# Patient Record
Sex: Female | Born: 1935 | Race: White | Hispanic: No | Smoking: Current every day smoker
Health system: Southern US, Community
[De-identification: ages and names within clinical notes are randomized; demographics above are authoritative.]

## PROBLEM LIST (undated history)

## (undated) DIAGNOSIS — I2510 Atherosclerotic heart disease of native coronary artery without angina pectoris: Secondary | ICD-10-CM

## (undated) DIAGNOSIS — F329 Major depressive disorder, single episode, unspecified: Secondary | ICD-10-CM

## (undated) DIAGNOSIS — E1159 Type 2 diabetes mellitus with other circulatory complications: Secondary | ICD-10-CM

## (undated) DIAGNOSIS — H269 Unspecified cataract: Secondary | ICD-10-CM

## (undated) DIAGNOSIS — F17200 Nicotine dependence, unspecified, uncomplicated: Secondary | ICD-10-CM

## (undated) DIAGNOSIS — I252 Old myocardial infarction: Secondary | ICD-10-CM

## (undated) DIAGNOSIS — I350 Nonrheumatic aortic (valve) stenosis: Secondary | ICD-10-CM

## (undated) DIAGNOSIS — N184 Chronic kidney disease, stage 4 (severe): Secondary | ICD-10-CM

## (undated) DIAGNOSIS — E785 Hyperlipidemia, unspecified: Secondary | ICD-10-CM

## (undated) DIAGNOSIS — N83209 Unspecified ovarian cyst, unspecified side: Secondary | ICD-10-CM

## (undated) DIAGNOSIS — I739 Peripheral vascular disease, unspecified: Secondary | ICD-10-CM

## (undated) DIAGNOSIS — I10 Essential (primary) hypertension: Secondary | ICD-10-CM

## (undated) DIAGNOSIS — I119 Hypertensive heart disease without heart failure: Secondary | ICD-10-CM

## (undated) DIAGNOSIS — F32A Depression, unspecified: Secondary | ICD-10-CM

## (undated) DIAGNOSIS — F419 Anxiety disorder, unspecified: Secondary | ICD-10-CM

## (undated) HISTORY — DX: Essential (primary) hypertension: I10

## (undated) HISTORY — DX: Nonrheumatic aortic (valve) stenosis: I35.0

## (undated) HISTORY — PX: TOTAL VAGINAL HYSTERECTOMY: SHX2548

## (undated) HISTORY — PX: PARTIAL HYSTERECTOMY: SHX80

## (undated) HISTORY — DX: Hyperlipidemia, unspecified: E78.5

## (undated) HISTORY — DX: Atherosclerotic heart disease of native coronary artery without angina pectoris: I25.10

## (undated) HISTORY — PX: CARDIAC CATHETERIZATION: SHX172

## (undated) HISTORY — DX: Peripheral vascular disease, unspecified: I73.9

## (undated) HISTORY — DX: Nicotine dependence, unspecified, uncomplicated: F17.200

---

## 1994-07-16 HISTORY — PX: LEG AMPUTATION BELOW KNEE: SHX694

## 1997-07-16 DIAGNOSIS — I252 Old myocardial infarction: Secondary | ICD-10-CM

## 1997-07-16 HISTORY — DX: Old myocardial infarction: I25.2

## 1998-01-26 ENCOUNTER — Inpatient Hospital Stay (HOSPITAL_COMMUNITY): Admission: EM | Admit: 1998-01-26 | Discharge: 1998-02-02 | Payer: Self-pay | Admitting: Emergency Medicine

## 1998-01-26 HISTORY — PX: CORONARY ARTERY BYPASS GRAFT: SHX141

## 2000-05-13 ENCOUNTER — Encounter: Admission: RE | Admit: 2000-05-13 | Discharge: 2000-08-11 | Payer: Self-pay | Admitting: Family Medicine

## 2004-02-09 ENCOUNTER — Encounter: Admission: RE | Admit: 2004-02-09 | Discharge: 2004-02-09 | Payer: Self-pay | Admitting: Family Medicine

## 2004-02-15 ENCOUNTER — Encounter (HOSPITAL_BASED_OUTPATIENT_CLINIC_OR_DEPARTMENT_OTHER): Admission: RE | Admit: 2004-02-15 | Discharge: 2004-05-03 | Payer: Self-pay | Admitting: Internal Medicine

## 2004-05-16 ENCOUNTER — Encounter (HOSPITAL_BASED_OUTPATIENT_CLINIC_OR_DEPARTMENT_OTHER): Admission: RE | Admit: 2004-05-16 | Discharge: 2004-08-10 | Payer: Self-pay | Admitting: Internal Medicine

## 2004-08-11 ENCOUNTER — Encounter (HOSPITAL_BASED_OUTPATIENT_CLINIC_OR_DEPARTMENT_OTHER): Admission: RE | Admit: 2004-08-11 | Discharge: 2004-09-29 | Payer: Self-pay | Admitting: Internal Medicine

## 2006-02-09 ENCOUNTER — Inpatient Hospital Stay (HOSPITAL_COMMUNITY): Admission: EM | Admit: 2006-02-09 | Discharge: 2006-02-13 | Payer: Self-pay | Admitting: Emergency Medicine

## 2006-02-09 ENCOUNTER — Ambulatory Visit: Payer: Self-pay | Admitting: Internal Medicine

## 2006-02-11 ENCOUNTER — Encounter (INDEPENDENT_AMBULATORY_CARE_PROVIDER_SITE_OTHER): Payer: Self-pay | Admitting: Cardiology

## 2006-03-07 ENCOUNTER — Encounter: Admission: RE | Admit: 2006-03-07 | Discharge: 2006-03-07 | Payer: Self-pay | Admitting: Nephrology

## 2006-03-14 ENCOUNTER — Ambulatory Visit (HOSPITAL_COMMUNITY): Admission: RE | Admit: 2006-03-14 | Discharge: 2006-03-14 | Payer: Self-pay | Admitting: Nephrology

## 2006-03-20 ENCOUNTER — Ambulatory Visit: Payer: Self-pay | Admitting: Hematology & Oncology

## 2006-03-22 ENCOUNTER — Ambulatory Visit (HOSPITAL_COMMUNITY): Admission: RE | Admit: 2006-03-22 | Discharge: 2006-03-23 | Payer: Self-pay | Admitting: Nephrology

## 2006-03-22 ENCOUNTER — Encounter (INDEPENDENT_AMBULATORY_CARE_PROVIDER_SITE_OTHER): Payer: Self-pay | Admitting: Specialist

## 2008-08-25 ENCOUNTER — Ambulatory Visit: Payer: Self-pay | Admitting: Vascular Surgery

## 2008-11-11 ENCOUNTER — Encounter: Admission: RE | Admit: 2008-11-11 | Discharge: 2008-11-11 | Payer: Self-pay | Admitting: Family Medicine

## 2008-11-18 ENCOUNTER — Encounter: Admission: RE | Admit: 2008-11-18 | Discharge: 2008-11-18 | Payer: Self-pay | Admitting: Family Medicine

## 2008-11-23 ENCOUNTER — Encounter: Admission: RE | Admit: 2008-11-23 | Discharge: 2008-11-23 | Payer: Self-pay | Admitting: Family Medicine

## 2008-11-23 ENCOUNTER — Encounter (INDEPENDENT_AMBULATORY_CARE_PROVIDER_SITE_OTHER): Payer: Self-pay | Admitting: Family Medicine

## 2010-04-28 ENCOUNTER — Ambulatory Visit: Payer: Self-pay | Admitting: Cardiology

## 2010-07-24 ENCOUNTER — Encounter (HOSPITAL_BASED_OUTPATIENT_CLINIC_OR_DEPARTMENT_OTHER)
Admission: RE | Admit: 2010-07-24 | Discharge: 2010-08-15 | Payer: Self-pay | Source: Home / Self Care | Attending: Internal Medicine | Admitting: Internal Medicine

## 2010-07-28 ENCOUNTER — Ambulatory Visit: Admit: 2010-07-28 | Payer: Self-pay | Admitting: Vascular Surgery

## 2010-07-28 ENCOUNTER — Ambulatory Visit
Admission: RE | Admit: 2010-07-28 | Discharge: 2010-07-28 | Payer: Self-pay | Source: Home / Self Care | Attending: Vascular Surgery | Admitting: Vascular Surgery

## 2010-07-31 LAB — GLUCOSE, CAPILLARY: Glucose-Capillary: 131 mg/dL — ABNORMAL HIGH (ref 70–99)

## 2010-08-02 ENCOUNTER — Ambulatory Visit (HOSPITAL_COMMUNITY)
Admission: RE | Admit: 2010-08-02 | Discharge: 2010-08-02 | Payer: Self-pay | Source: Home / Self Care | Attending: General Surgery | Admitting: General Surgery

## 2010-08-02 ENCOUNTER — Encounter (HOSPITAL_BASED_OUTPATIENT_CLINIC_OR_DEPARTMENT_OTHER): Payer: Self-pay | Admitting: General Surgery

## 2010-08-06 ENCOUNTER — Encounter: Payer: Self-pay | Admitting: Nephrology

## 2010-08-08 ENCOUNTER — Ambulatory Visit
Admission: RE | Admit: 2010-08-08 | Discharge: 2010-08-08 | Payer: Self-pay | Source: Home / Self Care | Attending: Vascular Surgery | Admitting: Vascular Surgery

## 2010-08-09 ENCOUNTER — Inpatient Hospital Stay (HOSPITAL_COMMUNITY)
Admission: AD | Admit: 2010-08-09 | Discharge: 2010-08-11 | Payer: Self-pay | Source: Home / Self Care | Attending: Vascular Surgery | Admitting: Vascular Surgery

## 2010-08-09 LAB — CBC
HCT: 37.1 % (ref 36.0–46.0)
Hemoglobin: 11.9 g/dL — ABNORMAL LOW (ref 12.0–15.0)
MCH: 29.6 pg (ref 26.0–34.0)
MCHC: 32.1 g/dL (ref 30.0–36.0)
MCV: 92.3 fL (ref 78.0–100.0)
Platelets: 302 10*3/uL (ref 150–400)
RBC: 4.02 MIL/uL (ref 3.87–5.11)
RDW: 13.2 % (ref 11.5–15.5)
WBC: 9.4 10*3/uL (ref 4.0–10.5)

## 2010-08-09 LAB — BASIC METABOLIC PANEL
BUN: 49 mg/dL — ABNORMAL HIGH (ref 6–23)
CO2: 24 mEq/L (ref 19–32)
Calcium: 8.9 mg/dL (ref 8.4–10.5)
Chloride: 104 mEq/L (ref 96–112)
Creatinine, Ser: 2.78 mg/dL — ABNORMAL HIGH (ref 0.4–1.2)
GFR calc Af Amer: 20 mL/min — ABNORMAL LOW (ref >60)
GFR calc non Af Amer: 17 mL/min — ABNORMAL LOW (ref >60)
Glucose, Bld: 151 mg/dL — ABNORMAL HIGH (ref 70–99)
Potassium: 5 mEq/L (ref 3.5–5.1)
Sodium: 136 mEq/L (ref 135–145)

## 2010-08-09 LAB — PROTIME-INR
INR: 1.02 (ref 0.00–1.49)
Prothrombin Time: 13.6 seconds (ref 11.6–15.2)

## 2010-08-09 NOTE — Assessment & Plan Note (Signed)
OFFICE VISIT  Tricia [name], Tricia [name] DOB:  1934/07/10                                       08/08/2010 [id number]#:[id number]  The patient presents today for evaluation of ischemia of her left foot. She is known to our service with a very long history of peripheral vascular occlusive disease.  She had this dating back to emboli to her right foot secondary to SFA embolus treated by Dr. Dewayne Shorter in 1996. She subsequently underwent attempted limb salvage and bypass, and eventually underwent a right below-knee amputation.  This was in the late 1990s.  She presents now with severe ischemia of her left foot. She does have a prosthesis on her right foot.  She has open ulceration of her toe and cracking fissures over her left heel.  She was seen in the [facility] and referred to [place].  She does report severe pain associated with this.  She underwent noninvasive vascular studies in our office on January 13 and this revealed an ankle-arm index of 0.38 on the left with dampened waveforms.  She has had ongoing debridement of this in the [facility].  Her pain is constant and awakens her at night.  PAST MEDICAL HISTORY:  Significant for hyperlipidemia and hypertension, status post coronary bypass grafting in the past.  She reports that her cardiac status has been stable.  PHYSICAL EXAMINATION:  General:  White female appearing stated age in no acute distress.  Blood pressure is 200/74, pulse 68, respirations 18. HEENT is normal.  Chest:  Clear bilaterally.  Heart:  Regular rate and rhythm.  Abdomen:  Soft, moderately obese, benign, without masses. Musculoskeletal shows a right BKA.  I do not palpate popliteal or femoral pulses.  Neurologic:  No focal weakness or paresthesias.  Skin: Without ulcers or rashes.  I have reviewed these studies with the patient and her son.  I feel that she is at very high risk for limb loss unless she can  undergo revascularization.  She is scheduled for arteriogram as an outpatient and possible stenting if possible at [facility] on January 26.  We will obtain her renal function studies prior to proceeding with this.    Larina Earthly, M.D. Electronically Signed  TFE/MEDQ  D:  08/08/2010  T:  08/09/2010  Job:  [id number]  cc:   [name], M.D. [facility]

## 2010-08-10 HISTORY — PX: ILIAC VEIN ANGIOPLASTY / STENTING: SHX1788

## 2010-08-10 LAB — BASIC METABOLIC PANEL WITH GFR
BUN: 46 mg/dL — ABNORMAL HIGH (ref 6–23)
CO2: 22 meq/L (ref 19–32)
Calcium: 8.3 mg/dL — ABNORMAL LOW (ref 8.4–10.5)
Chloride: 106 meq/L (ref 96–112)
Creatinine, Ser: 2.76 mg/dL — ABNORMAL HIGH (ref 0.4–1.2)
GFR calc non Af Amer: 17 mL/min — ABNORMAL LOW
Glucose, Bld: 133 mg/dL — ABNORMAL HIGH (ref 70–99)
Potassium: 4.8 meq/L (ref 3.5–5.1)
Sodium: 135 meq/L (ref 135–145)

## 2010-08-10 LAB — GLUCOSE, CAPILLARY: Glucose-Capillary: 170 mg/dL — ABNORMAL HIGH (ref 70–99)

## 2010-08-10 LAB — POCT ACTIVATED CLOTTING TIME
Activated Clotting Time: 199 seconds
Activated Clotting Time: 152 s

## 2010-08-10 LAB — CARDIAC PANEL(CRET KIN+CKTOT+MB+TROPI)
CK, MB: 3.2 ng/mL (ref 0.3–4.0)
Relative Index: 2.2 (ref 0.0–2.5)
Total CK: 147 U/L (ref 7–177)

## 2010-08-11 LAB — BASIC METABOLIC PANEL
BUN: 37 mg/dL — ABNORMAL HIGH (ref 6–23)
CO2: 26 mEq/L (ref 19–32)
Calcium: 8.4 mg/dL (ref 8.4–10.5)
Chloride: 102 mEq/L (ref 96–112)
Creatinine, Ser: 2.79 mg/dL — ABNORMAL HIGH (ref 0.4–1.2)
GFR calc Af Amer: 20 mL/min — ABNORMAL LOW (ref >60)
GFR calc non Af Amer: 17 mL/min — ABNORMAL LOW (ref >60)
Glucose, Bld: 123 mg/dL — ABNORMAL HIGH (ref 70–99)
Potassium: 4.8 mEq/L (ref 3.5–5.1)
Sodium: 138 mEq/L (ref 135–145)

## 2010-08-11 LAB — CBC
HCT: 33.8 % — ABNORMAL LOW (ref 36.0–46.0)
Hemoglobin: 10.8 g/dL — ABNORMAL LOW (ref 12.0–15.0)
MCH: 29.6 pg (ref 26.0–34.0)
MCHC: 32 g/dL (ref 30.0–36.0)
MCV: 92.6 fL (ref 78.0–100.0)
Platelets: 275 10*3/uL (ref 150–400)
RBC: 3.65 MIL/uL — ABNORMAL LOW (ref 3.87–5.11)
RDW: 13.3 % (ref 11.5–15.5)
WBC: 9.3 10*3/uL (ref 4.0–10.5)

## 2010-08-11 LAB — PROTIME-INR
INR: 1.04 (ref 0.00–1.49)
Prothrombin Time: 13.8 seconds (ref 11.6–15.2)

## 2010-08-11 NOTE — Procedures (Unsigned)
DUPLEX DEEP VENOUS EXAM - LOWER EXTREMITY  INDICATION:  Severe PAD, left lower extremity wound.  HISTORY:  Edema:  No. Trauma/Surgery:  Right below knee amputation. Pain:  Left heel pain. PE:  No. Previous DVT:  No. Anticoagulants: Other:  Left posterior leg, heel and great toe wounds with bandaging.  DUPLEX EXAM:               CFV   SFV   PopV  PTV    GSV               R  L  R  L  R  L  R   L  R  L Thrombosis       o     o     +      o     o Spontaneous      +     +     +      +     + Phasic           +     +     +      +     + Augmentation     +     +     +      +     + Compressible     +     +     +      +     + Competent        o     o     o            +  Legend:  + - yes  o - no  p - partial  D - decreased  IMPRESSION: 1. No evidence of deep or superficial vein thrombosis noted in the     left lower extremity. 2. Reflux of >500 milliseconds noted throughout the left femoral     popliteal venous system. 3. Partially occlusive chronic venous thrombus is noted in the left     popliteal vein. 4. Incidental note:  The left common femoral and superficial femoral     arteries appear to be totally occluded with collateral flow     supplying a patent left popliteal artery.   _____________________________ Larina Earthly, M.D.  CH/MEDQ  D:  07/28/2010  T:  07/28/2010  Job:  [id number]

## 2010-08-13 NOTE — Op Note (Addendum)
Tricia [name], Tricia [name] NO.:  [id number]  MEDICAL RECORD NO.:  [id number]           PATIENT TYPE:  LOCATION:                                 FACILITY:  PHYSICIAN:  Fransisco Hertz, MD       DATE OF BIRTH:  [birth year]  DATE OF PROCEDURE:  08/10/2010 DATE OF DISCHARGE:                              OPERATIVE REPORT   PROCEDURES: 1. Left common femoral artery cannulation under ultrasound guidance. 2. Aortogram with carbon dioxide contrast. 3. Right leg runoff with dye contrast. 4. Left common iliac stenting.  PREOPERATIVE DIAGNOSIS:  Left leg rest pain.  POSTOPERATIVE DIAGNOSIS:  Left leg rest pain.  SURGEON:  Fransisco Hertz, MD  ESTIMATED BLOOD LOSS:  Minimum.  CONTRAST:  25 mL.  FINDINGS: 1. A patent aorta.  This is a poor quality image due to overlying     bowel gas. 2. Patent celiac and SMA. 3. Patent bilateral renal arteries. 4. Patent right common iliac artery that demonstrates heavy     calcification. 5. High-grade left common iliac artery stenosis greater than 90% that     is heavily calcified. 6. There is occluded right external iliac artery and no obvious common     femoral artery reconstitution.  There are some collateral that are     evident in the right lateral pelvis around this occlusion. 7. Left common iliac stenosis was resolved after placement of a left     common iliac covered stent with about a 3-mm protrusion into the     aortic lumen without any obvious compromise of the right common     iliac artery flow channel. 8. There is a patent left external iliac artery. 9. Small distal left common iliac artery ulcerated area in the artery     of less than 2 mm in depth. 10.There is a patent left common femoral artery. 11.Patent left profunda artery. 12.Occluded left superficial femoral artery. 13.Reconstituted left above-the-knee popliteal artery. 14.The left trifurcation is intact with anterior tibial as the     dominant runoff. 15.The  left foot pedal arch fills via the anterior tibial artery. 16.On ultrasound examination of the right common femoral artery, there     is no pulse evident.  INDICATIONS:  This is a 77 year old female with known peripheral arterial disease, who previously had a right below-the-knee amputation and now is developing lesions and rest pain in her left foot.  It was felt that her only chance of salvage of this left foot was to undergo possible combined intervention for iliac occlusion and possibly a bypass from the level of the groin to possible popliteal.  The patient is aware of the risks of this procedure which included bleeding, infection, possible embolization, possible rupture, possible need for emergent surgical intervention, and possible need for additional procedures.  She was aware of the risks and agreed to proceed forward with such. Additionally, I had discussed with her due to her advanced baseline renal insufficiency she was at high risk for possible development of acute renal failure due to the need for use of IV dye, but she is aware  of this risk and agreed to proceed forward.  DESCRIPTION OF THE OPERATION:  After full informed written consent was obtained from the patient, she was brought back to the angio suite, placed supine upon the angio table.  She was connected to monitoring equipment and given conscious sedation, amounts of which are documented in our chart.  She was then prepped and draped in a standard fashion for aortogram and bilateral leg runoff.  First, I interrogated the right common femoral artery under ultrasound.  There was absolutely no pulse in this artery, so I went over to the left groin and interrogated it with ultrasound.  There was a weak pulsation in this artery.  I cannulated this artery with a micropuncture needle and then passed the wire.  The wire appeared to be extending laterally into a collateral, so I performed a hand injection through a  needle.  It did demonstrate at least a nub of common femoral artery that was patent, so over the wire I placed the micro sheath and then taking the wire out and the dilator, there actually was pulsatile flow in this common femoral artery.  I took a Holiday representative and tried to track up into the external iliac artery. Initially, I would not, however, so I got a J-wire and passed this successfully up the external iliac artery until I hit what was thought to be an occlusion at the left common iliac artery.  There was enough length, however, to load completely a sheath, so at this point I exchanged the micro sheath for 5-French sheath and I performed some hand injection through the side port.  This demonstrated a high-grade stenosis in the left common iliac artery.  I placed a Kumpfe catheter over the wire and then exchanged the wire for a Glidewire.  Using this combination, I was able to navigate the left common iliac artery stenosis and get into the aorta.  The catheter was then exchanged for a Omni Flush catheter which was advanced to the level of L1, which was connected to the CO2 circuit.  The hand injections were completed, the findings are listed above. Unfortunately, the patient's underlying bowel gas and obesity make these images of poor quality.  I pulled down the catheter down to the level of the bifurcation and then performed another set of CO2 injections.  It became evident that it would be impossible to get decent left leg films without treating the left common iliac artery stenosis, so at this point the wire was exchanged for a Rosen wire.  The catheter was removed.  I exchanged at this point the left sheath for an 8-French long sheath which was advanced through the stenosis without any difficulties.  I gave the patient 6000 units of intravenous Heparin to obtain anticoagulation.  At this point, I was able to do some hand injection to help clarify the size of this common iliac  artery and it appeared to be on digital caliper measurement about 8 mm in the mid segment of this common iliac artery and that based on the hand injections, we also determined where the entry site should be and where the landing zone for stent.  Based on these numbers, I determined an 8 mm x 38-mm covered stent was necessary as the aorta distally was heavily calcified as was the common iliac artery.  I felt that a covered stent was necessary to limit the risk due to a possible iliac rupture.  I over the wire placed this covered stent extending  it slightly into the aorta.  Note that as the right common femoral artery was completely occluded and no evidence of a cannulable right external iliac artery, I could not place a right common iliac stents in a kissing fashion, so we deployed the balloon expandable stent at the predetermined location without any difficulties.  There was a waist in the process of inflating this balloon, but it resolved with inflation up to the nominal pressure at 8 atmospheres.  I then took the balloon out, the hand injections were completed, this demonstrated, in fact, complete resolution of this common iliac artery stenosis and the protrusion at the stent appeared to be about 2-3 mm into the aortic lumen without compromise of the right common iliac artery flow channel. At this point, I over the wire placed an Omni Flush catheter up into the aortic bifurcation and completed some CO2 injections.  This however was not able to visualize the iliac system well and I looked back to my prior images and there appeared to be good imaging of both the entire iliac system down to the common femoral artery, so at this point I removed the Omni Flush catheter after straightened it out with a Bentson wire and then I connected the CO2 circuit to the sheath on her left side which I pulled down into left external iliac artery.  Hand injections were completed which demonstrated the  findings listed above for the left leg run off.  These were excellent images, so I did not feel that additional dye studies were necessary.  It clearly demonstrates a superficial femoral artery occlusion as flush with profunda flow which reconstitutes above-the-knee popliteal segment.  Based on these findings, this patient will need additionally a reconstruction from her left common femoral artery to her above-knee popliteal artery or below- knee popliteal artery.  Unfortunately, as the only cannulation site possible in this case was the left iliac, it is not possible to intervene with a subintimal angioplasty on this side at this time, so my plan at this point is allow the patient to reverse her anticoagulation on her own, pull the sheath in the holding area, keep her flat for 4 hours and then I will discuss with Dr. Arbie Cookey plans in regard of possible left common femoral artery to above-knee bypass versus below knee bypass.    COMPLICATIONS:  None.  CONDITION:  Stable.     Fransisco Hertz, MD     BLC/MEDQ  D:  08/10/2010  T:  08/11/2010  Job:  [id number]  Electronically Signed by Leonides Sake MD on 08/13/2010 03:15:53 PM

## 2010-08-16 ENCOUNTER — Encounter (HOSPITAL_BASED_OUTPATIENT_CLINIC_OR_DEPARTMENT_OTHER): Payer: Medicare Other

## 2010-08-23 NOTE — Discharge Summary (Addendum)
Tricia [name], Tricia [name]               ACCOUNT NO.:  [id number]  MEDICAL RECORD NO.:  [id number]          PATIENT TYPE:  INP  LOCATION:  2031                         FACILITY:  [facility]  PHYSICIAN:  Larina Earthly, M.D.    DATE OF BIRTH:  September 06, 1933  DATE OF ADMISSION:  08/09/2010 DATE OF DISCHARGE:  08/11/2010                              DISCHARGE SUMMARY   ADMITTING DIAGNOSIS:  Ischemia of the left lower extremity.  PAST MEDICAL HISTORY AND DISCHARGE DIAGNOSES: 1. Ischemia of the left lower extremity status post arteriogram with     left common iliac artery stent by Dr. Imogene Burn. 2. Nonhealing wound to the left foot. 3. Status post history of right below-knee amputation. 4. Hyperlipidemia. 5. Hypertension. 6. Status post coronary artery bypass graft in the past.  ALLERGIES:  No known drug allergies.  BRIEF HISTORY:  The patient is a 77 year old Caucasian female who presented to the [facility] on August 08, 2010, with complaints of pain in her left foot.  The patient has a long history of peripheral vascular occlusive disease.  She was treated in the past with a right below-the-knee amputation.  The patient presented with a severe ischemia of her left foot on August 08, 2010.  She had an open ulceration of the toe and cracking fissures over the left heel and has been followed at the [facility].  The patient reported severe pain associated with her foot.  Vascular studies in the office revealed an ABI of 0.3 on the left with damped waveforms.  Her pain was constant and awakened her at night.  Secondary to these issues, Dr. Arbie Cookey felt that the patient was at high risk for limb loss and should undergo revascularization.  She does have renal insufficiency and therefore was admitted on August 09, 2010, for IV hydration prior to procedure.  HOSPITAL COURSE:  The patient was admitted on August 09, 2010, for hydration as previously stated.  She was  then taken to the peripheral vascular lab on August 10, 2010, for an arteriogram and a left common iliac stent was placed.  The patient is noted to have a patent aorta, patent right common iliac artery, occluded right common iliac artery with no obvious common femoral artery reconstitution, patent left common femoral artery, patent left profunda femoral artery, occluded left SFA with reconstitution of the left above-knee pop and a left trifurcation intact with an anterior tibial prominent runoff.  The patient tolerated the procedure well and was hemodynamically stable immediately postprocedure.  She was transferred back to the floor without difficulty.  On August 11, 2010, the patient is without complaint, except some mild soreness.  She is afebrile with stable vital signs although her blood pressure has been variably high.  This improves after she receives her blood pressure medications.   On physical exam, the groin is stable, left foot wounds are stable and the left lower extremity is warm.  The patient's creatinine remained stable at 2.79 postprocedure.  She is doing well and is felt ready for discharge home at this time.    The patient will need to  continue on daily Plavix secondary to the left common iliac artery stent.  The patient is made aware that she does have some distal disease in the left lower extremity; however, Dr. Arbie Cookey feels that this should be monitored at this time to see if her pain improves and the wounds will heal with the left common iliac artery stent only.  If not, the patient will require a left lower extremity revascularization.  LABORATORY DATA:  CBC and BMP on August 11, 2010, white count 9.3, hemoglobin 10.8, hematocrit 33.8, platelets 275,000, sodium 138, potassium 4.8, BUN 39, creatinine 2.7.  MEDICATIONS ON DISCHARGE: 1. Plavix 75 mg one daily. 2. Hydrocodone/acetaminophen 325 mg 1-2 p.o. q.4-6 h. p.r.n. pain. 3. Aspirin 81 mg daily 4.  Benazepril 40 mg daily. 5. Carvedilol 25 mg b.i.d. 6. Cephalexin 500 mg t.i.d. 7. Quantity 0.1 mg at bedtime. 8. Crestor 40 mg at bedtime. 9. Felodipine 10 mg daily. 10.Micardis HCT 80/12.5 mg one daily. 11.Multivitamin one daily. 12.Sanctura XR 60 mg daily. 13.Tekturna 150 mg daily.  Discharge:  The patient received specific written discharge instructions regarding continuation of wound care and activity levels.  The patient will follow up with Dr. Arbie Cookey in 2 weeks.  The patient will be contacted with the date and time of that appointment.  The patient will also need to have repeat labs drawn including a CBC and a BMP prior to her followup appointment with Dr. Arrie Aran to reassess her creatinine.  This will be arranged at [facility] in [place] with the results being sent to Dr. Arrie Aran.     [name], PA   ______________________________ Larina Earthly, M.D.    AY/MEDQ  D:  08/11/2010  T:  08/12/2010  Job:  [id number]  cc:   [name], M.D.  Electronically Signed by [name] PA on 08/14/2010 10:47:18 AM Electronically Signed by TODD EARLY M.D. on 08/23/2010 11:22:08 AM

## 2010-08-29 ENCOUNTER — Encounter (INDEPENDENT_AMBULATORY_CARE_PROVIDER_SITE_OTHER): Payer: Medicare Other

## 2010-08-29 ENCOUNTER — Ambulatory Visit (INDEPENDENT_AMBULATORY_CARE_PROVIDER_SITE_OTHER): Payer: Medicare Other | Admitting: Vascular Surgery

## 2010-08-29 DIAGNOSIS — I70219 Atherosclerosis of native arteries of extremities with intermittent claudication, unspecified extremity: Secondary | ICD-10-CM

## 2010-08-29 DIAGNOSIS — Z01810 Encounter for preprocedural cardiovascular examination: Secondary | ICD-10-CM

## 2010-08-29 DIAGNOSIS — I739 Peripheral vascular disease, unspecified: Secondary | ICD-10-CM

## 2010-08-30 NOTE — Assessment & Plan Note (Signed)
OFFICE VISIT  Tricia [name], Tricia [name] DOB:  29-Apr-1934                                       08/29/2010 [id number]#:[id number]  Patient presents today for follow-up of her left leg lower extremity arterial insufficiency.  She had presented with severe rest pain, severe ischemia, and a nonhealing ulcer over her heel and left great toe.  She underwent outpatient arteriography with Dr. Leonides Sake.  Due to her elevated creatinine, she was admitted for hydration prior to this and did the majority of the procedure with CO2 angio.  She did have a severe stenosis in her left proximal iliac artery and did have a complete occlusion of her superficial femoral artery at its origin and reconstitution of above-knee popliteal artery with a very nice 3-vessel runoff.  I am happy that she reports that she has had complete resolution of her rest pain.  Her foot does look much better without the dependent rubor that it had prior.  Her ulcerations are certainly quite stable, and I feel that it is appropriate to continue observation only at this time. She understands that if she has progressive tissue loss or recurrent pain that we would be prepared to proceed with a left femoral to above- knee popliteal bypass.  She did undergo vein mapping today, and this shows a very nice great saphenous vein for bypass if needed.  We will see her again in 1 month, and she will notify us sooner should she develop any progressive tissue loss or pain.    Larina Earthly, M.D.  TFE/MEDQ  D:  08/29/2010  T:  08/30/2010  Job:  [id number]  cc:   [name], M.D. [facility] Fransisco Hertz, MD

## 2010-08-31 NOTE — Procedures (Unsigned)
VASCULAR LAB EXAM  INDICATION:  Preoperative vein mapping.  HISTORY: Diabetes:  Yes. Cardiac:  Unknown. Hypertension:  Yes.  EXAM:  Left greater saphenous vein mapping.  IMPRESSION:  Patent left greater saphenous vein with diameter measurements included on the following worksheet.  ___________________________________________ Larina Earthly, M.D.  EM/MEDQ  D:  08/29/2010  T:  08/29/2010  Job:  [id number]

## 2010-09-20 ENCOUNTER — Encounter (HOSPITAL_BASED_OUTPATIENT_CLINIC_OR_DEPARTMENT_OTHER): Payer: Medicare Other

## 2010-09-26 ENCOUNTER — Ambulatory Visit (INDEPENDENT_AMBULATORY_CARE_PROVIDER_SITE_OTHER): Payer: Medicare Other | Admitting: Vascular Surgery

## 2010-09-26 DIAGNOSIS — I739 Peripheral vascular disease, unspecified: Secondary | ICD-10-CM

## 2010-09-26 DIAGNOSIS — L98499 Non-pressure chronic ulcer of skin of other sites with unspecified severity: Secondary | ICD-10-CM

## 2010-09-26 NOTE — Assessment & Plan Note (Signed)
OFFICE VISIT  Tricia [name], Tricia [name] DOB:  08/31/1933                                       09/26/2010 [id number]#:[id number]  Patient presents today for continued follow-up of her left foot.  She initially had been seen for profound ischemic changes and underwent arteriography by Dr. Leonides Sake, where she underwent successful common iliac artery angioplasty and stenting.  She did have occlusion of her superficial femoral artery with above-knee popliteal reconstitution and 3-vessel runoff.  She has been followed in the hopes that she can heal her foot without bypass.  She has minimal discomfort associated with this and continues to have very good healing.  She has an eschar over her great toe nail bed and very superficial healing wounds over her heel.  I am quite pleased with the result, as is the patient.  She will continue to follow up with her medical doctors and will notify us should she develop any worsening ulcerations.  This does appear to be on its way to healing.    Larina Earthly, M.D. Electronically Signed  TFE/MEDQ  D:  09/26/2010  T:  09/26/2010  Job:  [id number]  cc:   Windle Guard, M.D. Peter M. [place], M.D. [name], M.D. Fransisco Hertz, MD

## 2010-10-18 ENCOUNTER — Encounter (HOSPITAL_BASED_OUTPATIENT_CLINIC_OR_DEPARTMENT_OTHER): Payer: Medicare Other

## 2010-11-28 NOTE — Procedures (Signed)
RENAL ARTERY DUPLEX EVALUATION   INDICATION:  Increased creatinine, history of  partial left renal  infarct.   HISTORY:  Diabetes:  Yes.  Cardiac:  MI.  Hypertension:  Yes.  Smoking:  Yes.  Other:  The patient states no abdominal vascular surgery.   RENAL ARTERY DUPLEX FINDINGS:  Aorta-Proximal:  93 cm/s  Aorta-Mid:  103 cm/s  Aorta-Distal:  80 cm/s  Celiac Artery Origin:  189 cm/s  SMA Origin:  207 cm/s                                    RIGHT               LEFT  Renal Artery Origin:             233 cm/s            Not visualized  Renal Artery Proximal:           286 cm/s            122 cm/s  Renal Artery Mid:                211 cm/s            81 cm/s  Renal Artery Distal:             106 cm/s            78 cm/s  Hilar Acceleration Time (AT):  Renal-Aortic Ratio (RAR):        3.1                 1.3  Kidney Size:                     12.0 cm             12.4 cm  End Diastolic Ratio (EDR):  Resistive Index (RI):            0.93                0.97   IMPRESSION:  1. Increased Doppler velocities noted in the right renal artery in the      proximal to mid level which suggests a less than 60% stenosis,      based on the renal to aortic ratio.  2. No evidence of increased velocities noted in the left renal artery,      based on limited visualization.  3. Abnormal intrarenal resistive indices are noted bilaterally.  4. The bilateral kidney length measurements are within normal limits.  5. Limited visualization of the abdominal vasculature noted due to      patient body habitus and overlying bowel gas patterns.  6. A preliminary report was faxed to Dr. Hadley Pen office on      08/25/2008.       ___________________________________________  Larina Earthly, M.D.   CH/MEDQ  D:  08/25/2008  T:  08/25/2008  Job:  [id number]

## 2010-12-01 NOTE — Consult Note (Signed)
NAME:  Tricia [name], Tricia [name]                         ACCOUNT NO.:  [id number]   MEDICAL RECORD NO.:  [id number]                   PATIENT TYPE:  REC   LOCATION:  FOOT                                 FACILITY:  [facility]   PHYSICIAN:  Jonelle Sports. Sevier, M.D.              DATE OF BIRTH:  09/03/1933   DATE OF CONSULTATION:  02/17/2004  DATE OF DISCHARGE:                                   CONSULTATION   HISTORY:  This is a 77 year old white female who was referred through the  courtesy of Dr. Jeannetta Nap for assistance with management of an nonhealing ulcer  on the left heel.   The patient has a complex medical history centered around longstanding type  2 diabetes which apparently is in good control but also longstanding smoking  and severe peripheral vascular disease.  Apparently she developed ulceration  of the right lower extremity a number of years ago and eventually went  through a series of as many as 8 or 9 vascular surgeries to that extremity  in an effort to save it.  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   She has from time to time developed areas of ulceration about the left heel  and malleolar area, but these have always healed with relatively simple  measures.  Recently, however (beginning about three months ago), she  developed a fissure on the medial aspect of the left heel, and this has  progressed into a significant ulcer.  She has been treated initially with  Vaseline and Neosporin and dry dressings and had one course of oral sulfa  drugs.  This has not resulted in healing, and the area continues with  fibrinous exudate and somewhat painful.   It bears note that the patient has significant resting pain in that  extremity that is relieved only by dangling the foot.  She is not  sufficiently ambulatory (uses crutches) to really experience true  claudication.   She is here now for our assistance in healing  this wound.   PAST MEDICAL HISTORY:  1. Hypertension.  2. Diabetes.   ALLERGIES:  CODEINE.   MEDICATIONS:  Her regular medications include metformin, glipizide XL,  Lipitor, diltiazem, gemfibrozil, atenolol, benazepril, hydrochlorothiazide,  and hydrocodone.   PHYSICAL EXAMINATION:  Examination today is limited to the left lower  extremity in that the right lower extremity ends in a below-knee amputation  with prosthesis in place.  Skin temperature in left lower extremity are  essentially normal, but there is suspicious dependent rubor extending up to  about top level on that left foot.  There is no edema.  Dorsalis pedis pulse  is minimally palpable.  On Doppler examination, both dorsalis pedis and  posterior tibial pulses on that side are found to be monophasic and quite  damped.  There is also reflected a slight irregularity in heart rhythm.   The  plantar aspects of the foot are fortunately unremarkable, but on the  medial aspect of the left heel is a vertically oriented oval ulcer measuring  18 x 9 x 1.5 mm with its base covered by yellow, adherent, fibrinous sough.   IMPRESSION:  Ischemic ulcer, left heel.   DISPOSITION:  1. The patient was given instruction regarding foot care and diabetes via     video with nurse and physician reinforcement.  2. The matter of the patient's smoking (and incidentally she reeks of     tobacco today) is discussed, and she is advised that this is the worst     possible thing she can do in the face of this type of problem.  3. Although she professes no awareness of having been told there are any     circulatory problems in that extremity, it is discussed with her that her     symptom of rest pain and her physical findings clearly denote severe     vascular disease there which may require attention before this wound will     heal.  She adamantly states that she would not at this point consider     additional vascular surgery.  4. Attention is  then turned to the wound where some marginal crust is     debrided and where the base of the ulcer is carefully cross hatched.  5. The wound is then dressed with an application of Accuzyme and an     absorptive pad, and the heel placed in a protective bulky dressing and     held in place by stockinette, no tape.  6. The patient is wearing an open heel sandal on that foot which appears     adequate.  7. Arrangements will be made for the patient to have a vascular surgical     consultation so that, in the even this ulcer continues to worsen or fails     to heal, the issue of possible vascular surgery, if an option, could be     rethought.  8. The patient is instructed to change her dressing daily at home, cleansing     the wound with warm soap water, rinsing it well, drying it, and then     reapplying the Accuzyme with absorptive dressing.  9. Followup visit will be to this clinic in 6 days.                                               Jonelle Sports. Cheryll Cockayne, M.D.    RES/MEDQ  D:  02/17/2004  T:  02/17/2004  Job:  [id number]   cc:   Windle Guard, M.D.  [address]  [place], [place] [address]  Fax: [phone]   [facility] on [address]

## 2010-12-01 NOTE — Discharge Summary (Signed)
Tricia [name], Tricia [name] NO.:  [id number]   MEDICAL RECORD NO.:  [id number]          PATIENT TYPE:  INP   LOCATION:  5715                         FACILITY:  [facility]   PHYSICIAN:  Alvester Morin, M.D.  DATE OF BIRTH:  09/03/1933   DATE OF ADMISSION:  02/09/2006  DATE OF DISCHARGE:  02/13/2006                                 DISCHARGE SUMMARY   DATE OF BIRTH:  10-15-1933   DATE OF ADMISSION:  February 09, 2006   DATE OF DISCHARGE:  February 13, 2006   ATTENDING PHYSICIAN:  Alvester Morin, M.D.   DISCHARGE DIAGNOSES:  1. Renal infarction.  2. Diabetes mellitus type 2, controlled.  3. Hypertension, improved.  4. Tobacco abuse (2-3 cig/day).  5. Hyperlipidemia.  6. Coronary artery disease, status post coronary artery bypass graft in      1999, with 2-vessel disease.  7. Severe peripheral vascular disease, status post right below knee      amputation in 1999 and decreased pulses in the left foot by Doppler in      2005.  8. Status post hysterectomy.   MEDICATIONS AT DISCHARGE:  1. Atenolol 50 mg 1 tablet by mouth daily.  2. Benazepril 40 mg 1 tablet by mouth daily.  3. Diltiazem 180 mg 1 tablet by mouth daily.  4. Lasix 20 mg 1 tablet by mouth daily.  5. Lipitor 40 mg 1 tablet by mouth daily at bedtime.  6. Aspirin 81 mg 1 tablet by mouth daily.  7. Multivitamins 1 tablet by mouth daily.  8. Calcium plus vitamin D or Os-Cal 1 tablet by mouth 3 times daily.  9. Lovenox 80 mg subcutaneous injection 2 times daily 12 hours apart for      the next 2 days.  10.Morphine sulfate or MS Contin 30 mg 2 tablets of 15 mg q.12 hours      p.r.n. pain.  11.Glipizide 5 mg by mouth 2 times daily.  12.Coumadin 5 mg 1 tablet by mouth daily.   FOLLOWUP:  The patient has an appointment with Dr. Jeannetta Nap on February 15, 2006.  At this time, Dr. Jeannetta Nap will check her INR and adjust her anticoagulant  medicines.  Also, at this point, Dr. Jeannetta Nap should check the patient's  creatinine  and her glycemia in response to our change of her antidiabetic  medication.  We have stopped the metformin and decreased the dose of the  glipizide to 5 mg.  The patient also has an appointment with Dr. Arrie Aran  on March 06, 2006.  The patient was advised to a follow a carbohydrate  moderate diet.   CONDITION ON DISCHARGE:  Improved.   There are no pending labs.   PROCEDURES:  The patient had CT of the abdomen with IV contrast on February 09, 2006, that showed subacute infarct in the left kidney, or lobar nephronia;  tiny gallstones in the gallbladder without evidence of cholecystitis; a 5-cm  left adrenal myelolipoma; mild changes of COPD and chronic bronchitis;  extensive atheromatous changes in the abdominal aorta and mild colonic  diverticulosis without evidence of  diverticulitis.  The patient also had a  2D echo on February 11, 2006, that showed that the left ventricle was mildly  dilated with a systolic function of 50% to 55%; the left ventricular wall  thickness was mildly increased; the aortic valve thickness was mildly  increased; mild aortic valvular regurgitation; mild mitral valvular  regurgitation, and the left region was mild to moderately dilated.   CONSULTANTS:  Dr.Caporosi, Nephrology.   The patient presenting with left lower quadrant abdominal pain.  For full  details, please refer to the patient's chart, but briefly Tricia [name] is a 77-  year-old white woman with past medical history significant for diabetes  mellitus type 2, hypertension severe, peripheral vascular disease,  presenting to the ED with left lower quadrant abdominal pain 10/10,  radiating to the left flank, associated with nausea.  The pain started  suddenly the night prior to admission.  The patient also admitted for  dysuria and urinary frequency for 4-6 weeks prior to admission.   PHYSICAL EXAMINATION:  VITAL SIGNS:  On admission temperature 98, blood  pressure 230/80, pulse 71, respiratory rate 22.   Oxygen saturation 94% on  room air.  GENERAL:  The patient was in 7/10 abdominal and flank pain on the left side.  CARDIOVASCULAR:  Tachycardia with a 3/6 systolic ejection murmur.  GI:  It was normal, except tender in the left lower quadrant.  GU:  Negative for CVA tenderness.  The rest of the exam was normal.   LABS AT ADMISSION:  Sodium 139, potassium 4.4, chloride 108, bicarbonate 25,  BUN 16, creatinine 1.2, glucose 129.  Leukocytes 14.5 with an AST of 11.5.  Hemoglobin 15.3 with an MCV of 13.9.  Hematocrit 46.1 and thrombocytes 381.  An ABG showed pH 7.353, pCO2 37, pO2 72, bicarbonate 20.6, and oxygen  saturation of 94%.  A urinalysis was done and it showed yellow clear urine  with no glucose, no leukocyte esterase, no bilirubin, no ketones, no  nitrates, with traces of hemoglobin.  Total protein 100 mg/dL.  Red blood  cells 0-2 and urobilinogen 0.2.  Also calcium was 9.4.   ASSESSMENT AND PLAN:  1. Abdominal plus flank pain on the left.  A CT of the abdomen showed left      kidney infarct versus lobar nephronia.  A urinalysis was within normal      limits, ruling out a possible infection (lobar nephronia).  The patient      had multiple risk factors for renal infarction, for e.g. increased      coagulability secondary to nephrotic syndrome (She has proteinuria of      24 hours of 4 g) and also can be secondary to large atheromatous emboli      from her aortic atheromas.  We have started the patient on aspirin,      Lipitor and heparin.  We checked PT, PTT, and INRs daily.  We checked      CBCs q.8 hours to rule out bleeding.  We administered fluids.  During      her hospital stay, the INR remained subtherapeutic and 2 days prior to      discharge we started Coumadin.  One day prior to discharge, we started      her on Lovenox and this double therapy has helped the INR to jump from      1.4 to 1.8 before discharge.  The patient was discharged on Lovenox and     Coumadin and will  follow  up with Dr. Jeannetta Nap who will check her INR and      will adjust the treatment accordingly.  For pain, the patient was      started on morphine pump and she was also on MS Contin 30 mg p.o.      b.i.d. p.r.n.  One day prior to discharge, we discontinued the PCI pump      and the patient was in less pain, about 1/10, before discharge.  We      discharged her on MS Contin 30 mg b.i.d., as the pain was well      controlled on this medication.   1. Diabetes mellitus type 2.  The patient was on metformin and glipizide      at home.  We started her on sliding scale insulin in the hospital and      we added glipizide before discharge.  After we added glipizide, the      patient had two hypoglycemic episodes (first 29 and then 49).      Therefore we have decreased her glipizide dose to 5 mg p.o. b.i.d. at      discharge and we also held her metformin because her creatinine during      her hospital stay increased from 1.2 to 1.6.  Dr. Jeannetta Nap is to check      her glycemia and to decide for the need of adding insulin to her      glipizide.  The patient's hemoglobin A1c was 6.4 in the hospital.  She      was placed on an ADA diet that the patient says she will follow at      home.   1. Hypertension.  The patient was started on atenolol and diltiazem after      admission and then we added Lasix.  The patient's blood pressure was      very high on admission to 230/80, but during the hospital stay it      trended down and at discharge it was 140/58.   1. Hyperlipidemia.  We kept the patient on Lipitor and we will discharge      her on the same medications.   1. Status post coronary artery disease, status post coronary artery bypass      graft.  We have checked a 2D echocardiogram that showed a normal      ejection fraction.  An EKG was negative for acute event.   1. Tobacco abuse.  She had a smoking cessation consult.   LABS AT DISCHARGE:  Maximum temperature 99.8, systolic blood pressure  138  over diastolic 57, pulse 62, respiratory rate 20, oxygen saturation 94% on  room air.  CBG 126 at 7:30 a.m.  Sodium 134, potassium 4.4, chloride 104,  bicarbonate 24, BUN 17, creatinine 1.6, glucose 107, calcium 8.5, leukocytes  10.4, hemoglobin 11, hematocrit 32.5, thrombocytes 280.  PT 21.4, INR 1.8,  PTT 56.  There are no pending labs.      Carlus Pavlov, M.D.  Electronically Signed      Alvester Morin, M.D.  Electronically Signed    CG/MEDQ  D:  02/13/2006  T:  02/13/2006  Job:  [id number]   cc:   Buren Kos, M.D.  [name], M.D.

## 2011-02-08 ENCOUNTER — Other Ambulatory Visit: Payer: Self-pay | Admitting: Cardiology

## 2011-02-09 NOTE — Telephone Encounter (Signed)
escribe medication per fax request  

## 2011-02-12 ENCOUNTER — Encounter: Payer: Self-pay | Admitting: *Deleted

## 2011-02-14 ENCOUNTER — Encounter: Payer: Self-pay | Admitting: Cardiology

## 2011-02-14 ENCOUNTER — Ambulatory Visit (INDEPENDENT_AMBULATORY_CARE_PROVIDER_SITE_OTHER): Payer: Medicare Other | Admitting: Cardiology

## 2011-02-14 DIAGNOSIS — F17200 Nicotine dependence, unspecified, uncomplicated: Secondary | ICD-10-CM

## 2011-02-14 DIAGNOSIS — E785 Hyperlipidemia, unspecified: Secondary | ICD-10-CM

## 2011-02-14 DIAGNOSIS — I10 Essential (primary) hypertension: Secondary | ICD-10-CM

## 2011-02-14 DIAGNOSIS — E119 Type 2 diabetes mellitus without complications: Secondary | ICD-10-CM

## 2011-02-14 DIAGNOSIS — E1159 Type 2 diabetes mellitus with other circulatory complications: Secondary | ICD-10-CM | POA: Insufficient documentation

## 2011-02-14 DIAGNOSIS — I739 Peripheral vascular disease, unspecified: Secondary | ICD-10-CM | POA: Insufficient documentation

## 2011-02-14 DIAGNOSIS — I2510 Atherosclerotic heart disease of native coronary artery without angina pectoris: Secondary | ICD-10-CM

## 2011-02-14 DIAGNOSIS — N189 Chronic kidney disease, unspecified: Secondary | ICD-10-CM

## 2011-02-14 DIAGNOSIS — I119 Hypertensive heart disease without heart failure: Secondary | ICD-10-CM | POA: Insufficient documentation

## 2011-02-14 DIAGNOSIS — N184 Chronic kidney disease, stage 4 (severe): Secondary | ICD-10-CM | POA: Insufficient documentation

## 2011-02-14 MED ORDER — HYDRALAZINE HCL 25 MG PO TABS: 25.0000 mg | ORAL_TABLET | Freq: Three times a day (TID) | ORAL | Status: DC

## 2011-02-14 NOTE — Assessment & Plan Note (Signed)
Her last Myoview study demonstrated anterior and inferior basal infarcts. There was no ischemia. Ejection fraction was 55%. This was in August of 2010. She remains asymptomatic. We will continue with medical therapy.

## 2011-02-14 NOTE — Assessment & Plan Note (Signed)
Blood pressure is poorly controlled. Combination of Tekturna with an ACE inhibitor is not ideal but we need to find an acceptable alternative. She has multiple drug intolerances. I recommended hydralazine 25 mg t.i.d. If this results in better blood pressure control then we may be able to stop the tekturna. Have stressed the importance of sodium restriction.

## 2011-02-14 NOTE — Assessment & Plan Note (Signed)
We discussed the importance of smoking cessation particularly as it relates to her peripheral arterial disease. We discussed smoking cessation strategies and she states she will try to quit.

## 2011-02-14 NOTE — Patient Instructions (Addendum)
We will add hydralazine 25 mg three times a day.  Continue your other medications for now.  I will see you again in 6 months.  Stop smoking

## 2011-02-14 NOTE — Progress Notes (Signed)
Tricia [name] Date of Birth: [birth year]   History of Present Illness: Tricia [name] is seen today for follow up. She is status post stenting of the left iliac artery earlier this year for a nonhealing ulcer on her left great toe. She states that this is gradually healing. She's had no fever or chills. She denies any significant chest pain or shortness of breath. Her blood pressure has been elevated. Previously her Micardis HCT was discontinued because of renal insufficiency. She does remain on tekturna and benazepril. She has been intolerant to multiple blood pressure medications including felodipine, Sular, amlodipine, minoxidil, and clonidine.  Current Outpatient Prescriptions on File Prior to Visit  Medication Sig Dispense Refill  . aspirin 325 MG tablet Take 325 mg by mouth daily.        . benazepril (LOTENSIN) 40 MG tablet Take 40 mg by mouth daily.        . carvedilol (COREG) 25 MG tablet Take 25 mg by mouth 3 (three) times daily.       . cephALEXin (KEFLEX) 500 MG capsule Take 500 mg by mouth 4 (four) times daily.        . clopidogrel (PLAVIX) 75 MG tablet Take 75 mg by mouth daily.        . ergocalciferol (VITAMIN D2) 50000 UNITS capsule Take 50,000 Units by mouth every 21 ( twenty-one) days.       Marland Kitchen gemfibrozil (LOPID) 600 MG tablet Take 600 mg by mouth daily.       Marland Kitchen HYDROcodone-acetaminophen (VICODIN) 5-500 MG per tablet Take 1 tablet by mouth as needed.        . multivitamin (THERAGRAN) per tablet Take 1 tablet by mouth daily.        . rosuvastatin (CRESTOR) 40 MG tablet Take 40 mg by mouth daily.        . Trospium Chloride (SANCTURA XR) 60 MG CP24 Take 1 capsule by mouth daily.        . TEKTURNA 150 MG tablet TAKE 1 TABLET BY MOUTH DAILY  30 tablet  0    Allergies  Allergen Reactions  . Codeine     Past Medical History  Diagnosis Date  . Hypertension   . Hyperlipidemia   . Diabetes mellitus     type 2  . Coronary artery disease     post CABG in 1999, with 2-vessel  disease -- - LIMA graft to the LAD and a left radial artery graft to the PDA   . Myocardial infarction 1999  . 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 Chronic renal insufficiency   . Tobacco dependency   . Renal infarction     Past Surgical History  Procedure Date  . Partial hysterectomy   . Total vaginal hysterectomy   . Leg amputation below knee 1996    right  . Cardiac catheterization   . Coronary artery bypass graft 01/26/1998    Est. EF of 40-45% -- with 2-vessel disease -- LIMA graft to the LAD and a left radial artery graft to the PDA   . Iliac vein angioplasty / stenting 08/10/2010     Left common iliac stenting    History  Smoking status  . Current Everyday Smoker -- 0.5 packs/day  . Types: Cigarettes  Smokeless tobacco  . Never Used    History  Alcohol Use No    Family History  Problem Relation Age of Onset  . Heart attack Father 51  Review of Systems: The review of systems is positive for mild edema in her left leg.  All other systems were reviewed and are negative.  Physical Exam: BP 182/62  Pulse 54  Ht 5\' 8"  (1.727 m)  Wt 170 lb (77.111 kg)  BMI 25.85 kg/m2 She is an elderly white female in no acute distress. She is normocephalic, atraumatic. Pupils are equal round and reactive to light and accommodation. Extraocular movements are full. Oropharynx is clear. Neck is without JVD, adenopathy, thyromegaly. She has a soft carotid bruit. Lungs are clear. Cardiac exam reveals a grade 2/6 systolic murmur at the right upper sternal border. There is no S3. Abdomen is soft and nontender. She has a right AKA. Her left foot is wrapped in a bandage. There is trace pedal edema. LABORATORY DATA:   Assessment / Plan:

## 2011-02-14 NOTE — Assessment & Plan Note (Signed)
She remain on aspirin and Plavix as directed by Dr. Arbie Cookey.

## 2011-04-26 ENCOUNTER — Emergency Department (HOSPITAL_COMMUNITY): Payer: Medicare Other

## 2011-04-26 ENCOUNTER — Inpatient Hospital Stay (HOSPITAL_COMMUNITY)
Admission: EM | Admit: 2011-04-26 | Discharge: 2011-05-02 | DRG: 292 | Disposition: A | Discharge status: Home Health | Payer: Medicare Other | Attending: Cardiovascular Disease | Admitting: Cardiovascular Disease

## 2011-04-26 DIAGNOSIS — R0602 Shortness of breath: Secondary | ICD-10-CM

## 2011-04-26 DIAGNOSIS — S78119A Complete traumatic amputation at level between unspecified hip and knee, initial encounter: Secondary | ICD-10-CM

## 2011-04-26 DIAGNOSIS — D649 Anemia, unspecified: Secondary | ICD-10-CM | POA: Diagnosis present

## 2011-04-26 DIAGNOSIS — E785 Hyperlipidemia, unspecified: Secondary | ICD-10-CM | POA: Diagnosis present

## 2011-04-26 DIAGNOSIS — Z79899 Other long term (current) drug therapy: Secondary | ICD-10-CM

## 2011-04-26 DIAGNOSIS — I359 Nonrheumatic aortic valve disorder, unspecified: Secondary | ICD-10-CM | POA: Diagnosis present

## 2011-04-26 DIAGNOSIS — I5033 Acute on chronic diastolic (congestive) heart failure: Principal | ICD-10-CM | POA: Diagnosis present

## 2011-04-26 DIAGNOSIS — E119 Type 2 diabetes mellitus without complications: Secondary | ICD-10-CM | POA: Diagnosis present

## 2011-04-26 DIAGNOSIS — Z7902 Long term (current) use of antithrombotics/antiplatelets: Secondary | ICD-10-CM

## 2011-04-26 DIAGNOSIS — I2510 Atherosclerotic heart disease of native coronary artery without angina pectoris: Secondary | ICD-10-CM | POA: Diagnosis present

## 2011-04-26 DIAGNOSIS — F17200 Nicotine dependence, unspecified, uncomplicated: Secondary | ICD-10-CM | POA: Diagnosis present

## 2011-04-26 DIAGNOSIS — Z6828 Body mass index (BMI) 28.0-28.9, adult: Secondary | ICD-10-CM

## 2011-04-26 DIAGNOSIS — I739 Peripheral vascular disease, unspecified: Secondary | ICD-10-CM | POA: Diagnosis present

## 2011-04-26 DIAGNOSIS — I509 Heart failure, unspecified: Secondary | ICD-10-CM | POA: Diagnosis present

## 2011-04-26 DIAGNOSIS — Z951 Presence of aortocoronary bypass graft: Secondary | ICD-10-CM

## 2011-04-26 DIAGNOSIS — I517 Cardiomegaly: Secondary | ICD-10-CM | POA: Diagnosis present

## 2011-04-26 DIAGNOSIS — N179 Acute kidney failure, unspecified: Secondary | ICD-10-CM | POA: Diagnosis present

## 2011-04-26 DIAGNOSIS — I129 Hypertensive chronic kidney disease with stage 1 through stage 4 chronic kidney disease, or unspecified chronic kidney disease: Secondary | ICD-10-CM | POA: Diagnosis present

## 2011-04-26 DIAGNOSIS — N183 Chronic kidney disease, stage 3 unspecified: Secondary | ICD-10-CM | POA: Diagnosis present

## 2011-04-26 DIAGNOSIS — Z7982 Long term (current) use of aspirin: Secondary | ICD-10-CM

## 2011-04-26 LAB — MAGNESIUM: Magnesium: 2 mg/dL (ref 1.5–2.5)

## 2011-04-26 LAB — CK TOTAL AND CKMB (NOT AT ARMC)
CK, MB: 3.5 ng/mL (ref 0.3–4.0)
Relative Index: 3.4 — ABNORMAL HIGH (ref 0.0–2.5)
Total CK: 104 U/L (ref 7–177)

## 2011-04-26 LAB — COMPREHENSIVE METABOLIC PANEL
ALT: 7 U/L (ref 0–35)
Albumin: 2.7 g/dL — ABNORMAL LOW (ref 3.5–5.2)
Albumin: 2.9 g/dL — ABNORMAL LOW (ref 3.5–5.2)
Alkaline Phosphatase: 57 U/L (ref 39–117)
BUN: 25 mg/dL — ABNORMAL HIGH (ref 6–23)
BUN: 24 mg/dL — ABNORMAL HIGH (ref 6–23)
Chloride: 108 mEq/L (ref 96–112)
Creatinine, Ser: 2.13 mg/dL — ABNORMAL HIGH (ref 0.50–1.10)
Potassium: 4.5 mEq/L (ref 3.5–5.1)
Sodium: 141 mEq/L (ref 135–145)
Total Bilirubin: 0.2 mg/dL — ABNORMAL LOW (ref 0.3–1.2)
Total Protein: 6.6 g/dL (ref 6.0–8.3)

## 2011-04-26 LAB — URINALYSIS, ROUTINE W REFLEX MICROSCOPIC
Bilirubin Urine: NEGATIVE
Nitrite: NEGATIVE
Protein, ur: >300 mg/dL — AB
Specific Gravity, Urine: 1.018 (ref 1.005–1.030)
Urobilinogen, UA: 1 mg/dL (ref 0.0–1.0)

## 2011-04-26 LAB — URINE MICROSCOPIC-ADD ON

## 2011-04-26 LAB — CBC
HCT: 35.6 % — ABNORMAL LOW (ref 36.0–46.0)
Platelets: 295 10*3/uL (ref 150–400)
RDW: 14.6 % (ref 11.5–15.5)
WBC: 8.4 10*3/uL (ref 4.0–10.5)

## 2011-04-26 LAB — DIFFERENTIAL
Basophils Absolute: 0.1 10*3/uL (ref 0.0–0.1)
Eosinophils Absolute: 0.1 10*3/uL (ref 0.0–0.7)
Eosinophils Relative: 1 % (ref 0–5)
Lymphocytes Relative: 20 % (ref 12–46)

## 2011-04-26 LAB — GLUCOSE, CAPILLARY: Glucose-Capillary: 167 mg/dL — ABNORMAL HIGH (ref 70–99)

## 2011-04-26 LAB — CARDIAC PANEL(CRET KIN+CKTOT+MB+TROPI): CK, MB: 3.4 ng/mL (ref 0.3–4.0)

## 2011-04-26 LAB — PRO B NATRIURETIC PEPTIDE: Pro B Natriuretic peptide (BNP): 26201 pg/mL — ABNORMAL HIGH (ref 0–450)

## 2011-04-27 DIAGNOSIS — I359 Nonrheumatic aortic valve disorder, unspecified: Secondary | ICD-10-CM

## 2011-04-27 DIAGNOSIS — I5031 Acute diastolic (congestive) heart failure: Secondary | ICD-10-CM

## 2011-04-27 LAB — GLUCOSE, CAPILLARY
Glucose-Capillary: 110 mg/dL — ABNORMAL HIGH (ref 70–99)
Glucose-Capillary: 101 mg/dL — ABNORMAL HIGH (ref 70–99)

## 2011-04-27 LAB — CARDIAC PANEL(CRET KIN+CKTOT+MB+TROPI)
Relative Index: 2.7 — ABNORMAL HIGH (ref 0.0–2.5)
Total CK: 123 U/L (ref 7–177)
Troponin I: <0.3 ng/mL (ref <0.30)

## 2011-04-27 LAB — COMPREHENSIVE METABOLIC PANEL
ALT: 6 U/L (ref 0–35)
Alkaline Phosphatase: 53 U/L (ref 39–117)
BUN: 27 mg/dL — ABNORMAL HIGH (ref 6–23)
CO2: 22 mEq/L (ref 19–32)
Calcium: 9.2 mg/dL (ref 8.4–10.5)
GFR calc Af Amer: 23 mL/min — ABNORMAL LOW (ref >90)
GFR calc non Af Amer: 20 mL/min — ABNORMAL LOW (ref >90)
Glucose, Bld: 102 mg/dL — ABNORMAL HIGH (ref 70–99)
Sodium: 141 mEq/L (ref 135–145)
Total Protein: 6.2 g/dL (ref 6.0–8.3)

## 2011-04-28 ENCOUNTER — Inpatient Hospital Stay (HOSPITAL_COMMUNITY): Payer: Medicare Other

## 2011-04-28 DIAGNOSIS — I5033 Acute on chronic diastolic (congestive) heart failure: Secondary | ICD-10-CM

## 2011-04-28 LAB — BASIC METABOLIC PANEL
BUN: 33 mg/dL — ABNORMAL HIGH (ref 6–23)
Creatinine, Ser: 2.67 mg/dL — ABNORMAL HIGH (ref 0.50–1.10)
GFR calc Af Amer: 19 mL/min — ABNORMAL LOW (ref >90)
GFR calc non Af Amer: 16 mL/min — ABNORMAL LOW (ref >90)

## 2011-04-28 LAB — GLUCOSE, CAPILLARY: Glucose-Capillary: 114 mg/dL — ABNORMAL HIGH (ref 70–99)

## 2011-04-29 DIAGNOSIS — I5023 Acute on chronic systolic (congestive) heart failure: Secondary | ICD-10-CM

## 2011-04-29 LAB — BASIC METABOLIC PANEL
CO2: 26 mEq/L (ref 19–32)
Calcium: 9.2 mg/dL (ref 8.4–10.5)
Chloride: 102 mEq/L (ref 96–112)
Potassium: 3.9 mEq/L (ref 3.5–5.1)
Sodium: 137 mEq/L (ref 135–145)

## 2011-04-29 LAB — CBC
Hemoglobin: 10.9 g/dL — ABNORMAL LOW (ref 12.0–15.0)
Platelets: 274 10*3/uL (ref 150–400)
RBC: 3.74 MIL/uL — ABNORMAL LOW (ref 3.87–5.11)
WBC: 8.1 10*3/uL (ref 4.0–10.5)

## 2011-04-29 LAB — GLUCOSE, CAPILLARY
Glucose-Capillary: 115 mg/dL — ABNORMAL HIGH (ref 70–99)
Glucose-Capillary: 141 mg/dL — ABNORMAL HIGH (ref 70–99)
Glucose-Capillary: 112 mg/dL — ABNORMAL HIGH (ref 70–99)
Glucose-Capillary: 145 mg/dL — ABNORMAL HIGH (ref 70–99)

## 2011-04-29 LAB — CREATININE, URINE, RANDOM: Creatinine, Urine: 46.75 mg/dL

## 2011-04-29 LAB — SODIUM, URINE, RANDOM: Sodium, Ur: 46 mEq/L

## 2011-04-30 LAB — BASIC METABOLIC PANEL
CO2: 25 mEq/L (ref 19–32)
Calcium: 8.7 mg/dL (ref 8.4–10.5)
GFR calc non Af Amer: 14 mL/min — ABNORMAL LOW (ref >90)
Potassium: 3.8 mEq/L (ref 3.5–5.1)
Sodium: 139 mEq/L (ref 135–145)

## 2011-04-30 LAB — CBC
MCH: 28.7 pg (ref 26.0–34.0)
Platelets: 274 10*3/uL (ref 150–400)
RBC: 3.66 MIL/uL — ABNORMAL LOW (ref 3.87–5.11)

## 2011-04-30 LAB — GLUCOSE, CAPILLARY
Glucose-Capillary: 107 mg/dL — ABNORMAL HIGH (ref 70–99)
Glucose-Capillary: 137 mg/dL — ABNORMAL HIGH (ref 70–99)
Glucose-Capillary: 124 mg/dL — ABNORMAL HIGH (ref 70–99)
Glucose-Capillary: 117 mg/dL — ABNORMAL HIGH (ref 70–99)

## 2011-05-01 DIAGNOSIS — I5021 Acute systolic (congestive) heart failure: Secondary | ICD-10-CM

## 2011-05-01 LAB — BASIC METABOLIC PANEL
CO2: 25 mEq/L (ref 19–32)
CO2: 24 mEq/L (ref 19–32)
Calcium: 8.8 mg/dL (ref 8.4–10.5)
Chloride: 104 mEq/L (ref 96–112)
Creatinine, Ser: 3.15 mg/dL — ABNORMAL HIGH (ref 0.50–1.10)
Creatinine, Ser: 3.25 mg/dL — ABNORMAL HIGH (ref 0.50–1.10)
GFR calc Af Amer: 16 mL/min — ABNORMAL LOW (ref >90)
GFR calc non Af Amer: 13 mL/min — ABNORMAL LOW (ref >90)
Glucose, Bld: 183 mg/dL — ABNORMAL HIGH (ref 70–99)

## 2011-05-01 LAB — LIPID PANEL
Cholesterol: 159 mg/dL (ref 0–200)
Total CHOL/HDL Ratio: 3 RATIO
VLDL: 31 mg/dL (ref 0–40)

## 2011-05-01 LAB — GLUCOSE, CAPILLARY: Glucose-Capillary: 113 mg/dL — ABNORMAL HIGH (ref 70–99)

## 2011-05-02 LAB — BASIC METABOLIC PANEL
CO2: 26 mEq/L (ref 19–32)
Chloride: 104 mEq/L (ref 96–112)
Creatinine, Ser: 2.94 mg/dL — ABNORMAL HIGH (ref 0.50–1.10)
Sodium: 139 mEq/L (ref 135–145)

## 2011-05-02 LAB — GLUCOSE, CAPILLARY: Glucose-Capillary: 130 mg/dL — ABNORMAL HIGH (ref 70–99)

## 2011-05-06 NOTE — Consult Note (Signed)
Tricia [name], Tricia [name] NO.:  [id number]  MEDICAL RECORD NO.:  [id number]  LOCATION:  4741                         FACILITY:  [facility]  PHYSICIAN:  Veverly Fells. Excell Seltzer, MD  DATE OF BIRTH:  [birth year]  DATE OF CONSULTATION:  04/26/2011 DATE OF DISCHARGE:                                CONSULTATION   REASON FOR ADMISSION:  Shortness of breath.  HISTORY OF PRESENT ILLNESS:  Tricia [name] is a very nice 77 year old woman with coronary artery disease and peripheral arterial disease.  She presents with a 2-week history of progressive shortness of breath with minimal activity.  She also has developed orthopnea and PND.  She has not had significant leg swelling.  She has had to sleep in a recliner the last several nights.  She notes that her blood pressure has been markedly elevated.  She presented to the emergency department and was felt to have findings consistent with congestive heart failure.  She was severely hypertensive with systolic blood pressure greater than 200 mmHg.  She was started on nitroglycerin paste and given IV furosemide. At the time of my evaluation, she is feeling better and states that her breathing has improved.  She continues to smoke cigarettes.  She denies chest pain or pressure.  She denies any fevers, chills, or cough.  She admits to not following a close diet and she has been eating salty foods.  HOME MEDICATIONS: 1. Aspirin 325 mg daily. 2. Benazepril 40 mg daily. 3. Carvedilol 25 mg daily. 4. Plavix 75 mg daily. 5. Vitamin D. 6. Gemfibrozil 600 mg daily. 7. Hydrocodone/acetaminophen 5/500 mg as needed. 8. Multivitamin daily. 9. Crestor 40 mg daily. 10.Tekturna 150 mg daily. 11.Sanctura XR 60 mg daily.  ALLERGIES:  CODEINE.  PAST MEDICAL HISTORY: 1. Coronary artery disease status post coronary artery bypass surgery     in 1999 with a LIMA graft to the LAD and the left radial artery     graft to the PDA. 2. Severe peripheral vascular  disease with an above-knee amputation on     the right and recent lower extremity stenting procedure on the     left.  She had stenting of the left common iliac artery, August 11, 2010. 3. Chronic kidney disease. 4. Type 2 diabetes. 5. Severe hypertension. 6. Hyperlipidemia. 7. History of hysterectomy.  SOCIAL HISTORY:  The patient lives in [place].  She lives with her son.  She is disabled.  She is a long-time smoker greater than 50 years and continues to smoke cigarettes.  She does not drink alcohol and has no history of illicit drug use.  FAMILY HISTORY:  The patient's father died of myocardial infarction at age 39.  Her mother had Alzheimer disease.  REVIEW OF SYSTEMS:  Pertinent positives included arthralgias, chronic pain, constipation, frequent belching, otherwise negative except as per HPI.  PHYSICAL EXAMINATION:  GENERAL:  The patient is alert and oriented.  She is a very pleasant elderly woman in no acute distress. VITAL SIGNS:  Blood pressure was 209/60, heart rate 67, respiratory rate is 14, and oxygen saturations 95% on 2 L of oxygen per nasal cannula. HEENT:  Normal. NECK:  Bilateral carotid bruits are noted.  Jugular venous pressure is elevated.  There is no thyromegaly or thyroid nodules. LUNGS:  There is very poor air movement bilaterally with prolonged expiratory phase.  There are mild bibasilar rales. CARDIOVASCULAR:  The heart is regular rate and rhythm with a 2/6 systolic ejection murmur at the right upper sternal border. ABDOMEN:  Soft, obese, and nontender.  No organomegaly.  Positive bowel sounds. BACK:  No CVA tenderness. EXTREMITIES:  There is a right above-knee amputation noted.  The left leg shows no edema.  The left foot is red, but nontender.  Peripheral pulses in the left foot are diminished.  EKG shows normal sinus rhythm with right bundle-branch block.  This is unchanged from prior tracings.  Chest x-ray shows cardiomegaly with  interstitial edema and bilateral pleural effusions.  LABORATORY DATA:  Hemoglobin of 11.4, white blood cell count 8.4, and platelets 295,000.  Potassium is 5.1, creatinine is 2.13.  CK-MB is 104 and 3.5.  ASSESSMENT: 1. This is a 77 year old woman with acute diastolic heart failure     likely related to hypertensive urgency.  Recommend initiate a     nitroglycerin drip to titrate to systolic blood pressure ranged     from 140-160 mmHg.  We will continue IV diuresis with Lasix 40 mg     IV 3 times daily.  She will be continued on her home medical     regimen which includes hydralazine, benazepril, and carvedilol.  We     will assess her response to diuretic therapy and closely follow her     renal function with caution not to exacerbate her chronic kidney     disease.  Tobacco cessation was reviewed with the patient, but she     is highly unlikely to quit considering all the medical problems she     has been through with continued smoking.  We will repeat an     echocardiogram since she does not have a history of congestive     heart failure to make sure that there is not new left ventricular     dysfunction and cardiac enzymes will be cycled. 2. Type 2 diabetes.  The patient will be placed on sliding scale     insulin. 3. Coronary artery disease status post coronary artery bypass surgery.     We will cycle enzymes, but there is no clinical evidence of active     ischemia. 4. Peripheral arterial disease.  This appears stable.  Continue     aspirin and Plavix.     Veverly Fells. Excell Seltzer, MD     MDC/MEDQ  D:  04/26/2011  T:  04/27/2011  Job:  [id number]  Electronically Signed by Tonny Bollman MD on 05/06/2011 01:18:02 AM

## 2011-05-07 NOTE — Discharge Summary (Addendum)
Tricia [name], Tricia [name] NO.:  [id number]  MEDICAL RECORD NO.:  [id number]  LOCATION:  4741                         FACILITY:  [facility]  PHYSICIAN:  Peter M. Swaziland, M.D.  DATE OF BIRTH:  06-02-1934  DATE OF ADMISSION:  04/26/2011 DATE OF DISCHARGE:  05/02/2011                              DISCHARGE SUMMARY   DISCHARGE DIAGNOSES: 1. Acute on chronic diastolic heart failure. 2. Acute on chronic kidney disease. 3. Coronary artery disease, status post coronary artery bypass graft     in 1999. 4. Severe peripheral vascular disease with above-knee amputation on     the right and recent lower extremity stenting procedure on the left     with stenting of the left common iliac artery on August 11, 2010. 5. Mild aortic stenosis by echocardiogram, this admission. 6. Hypertension with hypertensive urgency, this admission. 7. Type 2 diabetes. 8. Hyperlipidemia. 9. History of hysterectomy.  HOSPITAL COURSE:  Tricia [name] is a 77 year old lady with a history of renal disease, diastolic heart failure and hypertension, who presents to [facility] with complaints of shortness of breath.  She was also noted to be markedly hypertensive.  She developed orthopnea and PND as well and it is hard to sleep in a recliner for last several nights. Her blood pressure was greater than 200 mmHg on admission to the ER and she was started on nitroglycerin paste and given IV Lasix.  At the time of evaluation by Cardiology, these measures have somewhat improved her symptoms, but she was still hypertensive at 209/60.  She was thus initiated on IV diuretics with Lasix as her presentation was consistent with acute diastolic heart failure, likely related to hypertensive urgency.  She was also placed on a nitroglycerin drip in the mean time. Her Plavix was continued.  A 2-D echocardiogram was obtained with the findings as noted below.  Throughout this hospitalization, her electrolytes were  also monitored along with her renal function; however, with IV diuresis, although she was symptomatically better, her creatinine began to rise and on October 16 it had peaked at 3.25.  She was seen in consultation by the Renal Service who felt that this was likely related to acute on chronic decompensated heart failure in the setting of increased diuresis with dual blockade of the renin- angiotensin system.  Her benazepril was discontinued.  She had been taken off of Tekturna on admission, which was also recommended.  Renal ultrasound was performed with findings noted below.  Her creatinine did improve somewhat on day of discharge and leveled off at 2.94.  Her medications were up titrated from her home doses of hydralazine and Lasix.  Per discussion with Dr. Swaziland, she was previously on above-the- max dose of Coreg at 25 t.i.d. and this will be decreased to b.i.d. at discharge.  Dr. Swaziland has seen and examined her today and felt she is stable for discharge.  DISCHARGE LABS:  Sodium 139, potassium 4.4, chloride 104, CO2 of 26, glucose 121, BUN 50, creatinine 0.94.  Total cholesterol 159, triglycerides 166, HDL 53, LDL 75.  STUDIES: 1. Renal ultrasound on October 13, showed diffuse bilateral renal     parenchymal atrophy.  Left  renal cyst.  No hydronephrosis. 2. Chest x-ray on April 26, 2011, showed cardiomegaly and     interstitial edema and bilateral pleural effusions. 3. A 2-D echocardiogram on April 27, 2011, showed EF of 55-60%,     acute diastolic dysfunction, mild-to-moderate LVH, mild aortic     stenosis, mild aortic regurgitation.  DISCHARGE MEDICATIONS: 1. Lipitor 40 mg at bedtime.  She may use of half tablet of her prior     Crestor tablets daily, then start this medicine. 2. Coreg 25 mg b.i.d. 3. Gemfibrozil 600 mg daily. 4. Hydralazine 50 mg t.i.d. 5. Lasix 40 mg b.i.d. 6. Plavix 75 mg daily. 7. Hydrocodone/APAP 7.5/325 mg 2 tabs q.4 hours p.r.n. pain, no more      than 8 per day. 8. Multivitamin 1 tablet daily.  The patient is instructed to stop her Crestor as it was changed to Lipitor with notes as above.  Her Tekturna and benazepril were also discontinued.  Please note the pharmacy tech, it was unclear whether or not the patient had actually been taking Tekturna, as this was hand written on to the patient sheet.  DISPOSITION:  Tricia [name] will be discharged in stable condition to home. She is to follow a low-sodium, heart-healthy diet.  She is to keep her IV site clean and dry.  She will follow up with Norma Fredrickson, N.P. on May 16, 2011, at 9:30 am.  The [facility] will also be calling the patient for an appointment.  DURATION OF DISCHARGE ENCOUNTER:  Greater than 30 minutes including physician and PA time.     Ronie Spies, P.A.C.   ______________________________ Peter M. Swaziland, M.D.    DD/MEDQ  D:  05/02/2011  T:  05/02/2011  Job:  [id number]  cc:   Peter M. Swaziland, M.D.  Electronically Signed by PETER Swaziland M.D. on 05/07/2011 02:56:53 PM Electronically Signed by Ronie Spies  on 05/10/2011 10:27:30 AM MedRecNo: [id number] [facility], Account: [id number], DocSeq: [id number] Aspirin 81mg  daily was also added to the med list at discharge. Electronically Signed by Ronie Spies  on 05/10/2011 10:25:37 AM

## 2011-05-09 NOTE — Consult Note (Signed)
[name], [name] NO.:  [id number]  MEDICAL RECORD NO.:  [id number]  LOCATION:  4741                         FACILITY:  [facility]  PHYSICIAN:  [name], M.D.DATE OF BIRTH:  [birth year]  DATE OF CONSULTATION:  04/28/2011 DATE OF DISCHARGE:                                CONSULTATION   CONSULTING PHYSICIAN:  Hillis Range, MD  REASON FOR CONSULTATION:  Acute kidney injury on chronic kidney disease in the setting of decompensated congestive heart failure.  HISTORY OF PRESENT ILLNESS:  Tricia [name] is a 77 year old white female, well known to me from outpatient follow up due to her multiple medical problems including atherosclerotic vascular disease, status post bypass graft surgery, diabetes, hypertension, peripheral vascular disease, and chronic kidney disease stage 3, who presented to [facility] with a 1-2 week history of increasing fatigue, weakness, dyspnea on exertion as well as lower extremity edema.  She was found to have acute on chronic systolic and diastolic congestive heart failure as well as hypertensive urgency with a blood pressure of 209/60.  She was admitted for IV diuresis.  Her blood pressures have improved, however, her creatinine has increased during her stay the trend is as follows.  On admission, creatinine was 2.13 on April 26, 2011; on April 27, 2011, it was 2.3; on April 28, 2011, it was 2.67.  We were consulted to help further evaluate and manage her acute kidney injury on chronic kidney disease.  Of note, the patient has been on Lotensin during her hospitalization as well as had aggressive diuresis and has been negative 3.7 L since her initial presentation.  ALLERGIES:  She has no known drug allergies.  PAST MEDICAL HISTORY: 1. Atherosclerotic vascular disease.     a.     Status post bypass graft surgery in 1999 with LIMA to LAD      and left radial artery graft to the PDA 2. Peripheral vascular disease, status  post right AKA and a left     stenting of her left common iliac artery in January, 2012. 3. Chronic kidney disease stage 3, baseline creatinine 2. 4. Diabetes mellitus. 5. Longstanding poorly controlled hypertension. 6. Hyperlipidemia. 7. Status post hysterectomy. 8. History of acute renal failure in the past secondary to renal     infarct back in August 2007, when she presented with flank pain     felt to be due to hypercoagulable state due to nephrotic syndrome     versus atheroemboli due to her aortic atheromas after almost     creatinine has been ranging 1.6-2 over the last 5 years.  OUTPATIENT MEDICATIONS: 1. Benazepril 40 mg a day. 2. Aspirin 325 mg daily. 3. Carvedilol 25 mg daily. 4. Plavix 75 mg daily. 5. Gemfibrozil 600 mg daily. 6. Vicodin p.r.n. 7. Multivitamin daily. 8. Crestor 40 mg daily. 9. Tekturna 150 mg daily. 10.Sanctura XR 60 mg daily.  FAMILY HISTORY:  Significant for coronary artery disease.  SOCIAL HISTORY:  She is a widow, [place].  She is disabled and has 50-pack year tobacco history, continues to smoke. Denies alcohol or drug use.  REVIEW OF SYSTEMS:  GENERAL:  Denies any  anorexia or malaise at this time, but did have increasing fatigue and weakness prior to admission. HEENT:  No tinnitus, dysphagia, odynophagia, cardiac, no chest pains, palpitations, but did have orthopnea and PND.  PULMONARY:  Increasing shortness of breath.  No hemoptysis or productive cough.  GI:  No nausea, vomiting, hematochezia, melena, bright red blood per rectum. GU:  No dysuria, pyuria, hematuria, urgency, frequency and retention. RHEUMATOLOGIC:  No arthralgias or myalgias, but had increasing lower extremity edema.  DERMATOLOGIC:  No rashes, lumps or bumps. HEMATOLOGIC:  No abnormal bleeding or bruising.  All other systems are negative.  PHYSICAL EXAMINATION:  GENERAL:  Well-developed, well-nourished female sitting in bed, in no apparent  distress. VITAL SIGNS:  Temperature 97.8, pulse 63, blood pressure 136/71, respiratory rate 17 and weight of 85.4 kg. HEENT:  Normocephalic, atraumatic.  Extraocular muscles intact.  No icterus.  Oropharynx without lesions. NECK:  Supple.  No lymphadenopathy.  She had bilateral carotid bruits. LUNGS:  Clear to auscultation and percussion bilaterally.  No rales, wheezes or rhonchi. CARDIAC:  Regular rate and rhythm with a 2/6 systolic ejection murmur heard best at the right upper sternal border. ABDOMEN:  Normoactive bowel sounds.  Soft, nontender and nondistended. No guarding, no rebound, no bruits. EXTREMITIES:  She is status post right AKA.  She had no pitting edema in her left lower extremity.  LABORATORY DATA:  Sodium 139, potassium 4.2, chloride 105, CO2 24, BUN 33, creatinine 2.67, glucose 102, calcium 9.1, TSH is 2.11.  PLAN AND ASSESSMENT: 1. Acute on chronic kidney disease likely due to acute on chronic     decompensated congestive heart failure in the setting of increasing     diuresis with dual blockade of the renin angiotensin system.  I     would discontinue the ACE inhibitor therapy for now.  She had been     taken off of the Amberg on admission.  I agree with a renal     ultrasound and continue to follow her renal function and urine     output. 2. Acute on chronic diastolic dysfunction, congestive heart failure,     improved with diuresis.  I agree with oral Lasix and we will follow 3. Diabetes mellitus, stable. 4. Hypertension markedly improved.  This is even without RAS blockade     and likely related to the fact that she has not been smoking in the     hospital, again stressed the importance of smoking cessation.     Continue with her medicines.  We may want to add hydralazine or     increase the dose since we are holding the ACE inhibitor. 5. Tobacco abuse as above.  Cessation consultation obtained. 6. Peripheral vascular disease, stable. 7. Disposition  hopefully discharged to home in the next 24-48 hours     depending on her renal function.  Thank you for this consult.          ______________________________ [name], M.D.     JC/MEDQ  D:  04/28/2011  T:  04/28/2011  Job:  [id number]  Electronically Signed by [name] M.D. on 05/09/2011 08:41:45 AM

## 2011-05-16 ENCOUNTER — Ambulatory Visit: Payer: Medicare Other | Admitting: Nurse Practitioner

## 2011-05-17 ENCOUNTER — Encounter: Payer: Self-pay | Admitting: Cardiovascular Disease

## 2011-05-21 ENCOUNTER — Encounter: Payer: Self-pay | Admitting: Cardiovascular Disease

## 2011-05-22 ENCOUNTER — Ambulatory Visit (INDEPENDENT_AMBULATORY_CARE_PROVIDER_SITE_OTHER): Payer: Medicare Other | Admitting: Nurse Practitioner

## 2011-05-22 ENCOUNTER — Encounter: Payer: Self-pay | Admitting: Nurse Practitioner

## 2011-05-22 VITALS — BP 180/80 | HR 58 | Ht 68.0 in | Wt 179.0 lb

## 2011-05-22 DIAGNOSIS — E785 Hyperlipidemia, unspecified: Secondary | ICD-10-CM

## 2011-05-22 DIAGNOSIS — I2510 Atherosclerotic heart disease of native coronary artery without angina pectoris: Secondary | ICD-10-CM

## 2011-05-22 DIAGNOSIS — F17200 Nicotine dependence, unspecified, uncomplicated: Secondary | ICD-10-CM

## 2011-05-22 DIAGNOSIS — N189 Chronic kidney disease, unspecified: Secondary | ICD-10-CM

## 2011-05-22 DIAGNOSIS — I10 Essential (primary) hypertension: Secondary | ICD-10-CM

## 2011-05-22 MED ORDER — AMLODIPINE BESYLATE 5 MG PO TABS: 5.0000 mg | ORAL_TABLET | Freq: Every day | ORAL | Status: DC

## 2011-05-22 NOTE — Assessment & Plan Note (Signed)
No current chest pain reported. Has normal EF. Last nuclear was in 2010.

## 2011-05-22 NOTE — Assessment & Plan Note (Signed)
Says she is cutting back on her smoking. Total cessation is encouraged.

## 2011-05-22 NOTE — Patient Instructions (Signed)
We are going to add Amlodipine to your medicines. It will be 5 mg daily.  Continue to watch your salt.  Stay on your other medicines, including the Crestor.  We will see you back in a month.

## 2011-05-22 NOTE — Assessment & Plan Note (Signed)
Blood pressure remains elevated. I have added Norvasc 5 mg daily after discussing with Dr. Swaziland. We will see her back in a month.

## 2011-05-22 NOTE — Assessment & Plan Note (Signed)
Has had progressive renal insufficiency with creatinine of 3. She is seeing renal. She says she is not interested in dialysis.

## 2011-05-22 NOTE — Progress Notes (Signed)
Tricia [name] Date of Birth: 01/07/1934 Medical Record #[id number]  History of Present Illness: Tricia [name] is seen today for a post hospital visit. She is seen for Dr. Swaziland. She was recently admitted with acute on chronic diastolic heart failure. This was in the setting of renal failure and valvular heart disease. She has a normal EF. Has mild AS.   She has been home for about 2 weeks. She is doing better. Blood pressure remains labile. Says she is using less salt. Continues to smoke. Not able to weigh due to unsteadiness. No swelling reported. No chest pain.   Current Outpatient Prescriptions on File Prior to Visit  Medication Sig Dispense Refill  . carvedilol (COREG) 25 MG tablet Take 25 mg by mouth 2 (two) times daily with a meal.       . clopidogrel (PLAVIX) 75 MG tablet Take 75 mg by mouth daily.        . ergocalciferol (VITAMIN D2) 50000 UNITS capsule Take 50,000 Units by mouth every 21 ( twenty-one) days.       . furosemide (LASIX) 40 MG tablet Take 40 mg by mouth 2 (two) times daily.        Marland Kitchen gemfibrozil (LOPID) 600 MG tablet Take 600 mg by mouth daily.       . multivitamin (THERAGRAN) per tablet Take 1 tablet by mouth daily.        . rosuvastatin (CRESTOR) 40 MG tablet Take 40 mg by mouth daily.       Marland Kitchen DISCONTD: aspirin 325 MG tablet Take 325 mg by mouth daily.        Marland Kitchen DISCONTD: hydrALAZINE (APRESOLINE) 25 MG tablet Take 1 tablet (25 mg total) by mouth 3 (three) times daily.  120 tablet  11  . DISCONTD: HYDROcodone-acetaminophen (VICODIN) 5-500 MG per tablet Take 1 tablet by mouth as needed.          Allergies  Allergen Reactions  . Codeine     Past Medical History  Diagnosis Date  . Hypertension   . Hyperlipidemia   . Diabetes mellitus     type 2  . Coronary artery disease     post CABG in 1999, with 2-vessel disease -- - LIMA graft to the LAD and a left radial artery graft to the PDA   . Myocardial infarction 1999  . Peripheral vascular disease     post AKA on  right  . Chronic renal insufficiency   . Tobacco dependency   . Renal infarction     Past Surgical History  Procedure Date  . Partial hysterectomy   . Total vaginal hysterectomy   . Leg amputation below knee 1996    right  . Cardiac catheterization   . Coronary artery bypass graft 01/26/1998    Est. EF of 40-45% -- with 2-vessel disease -- LIMA graft to the LAD and a left radial artery graft to the PDA   . Iliac vein angioplasty / stenting 08/10/2010     Left common iliac stenting    History  Smoking status  . Current Everyday Smoker -- 0.5 packs/day  . Types: Cigarettes  Smokeless tobacco  . Never Used    History  Alcohol Use No    Family History  Problem Relation Age of Onset  . Heart attack Father 67    Review of Systems: The review of systems is per the HPI. She has felt pretty good since her discharge. Tolerating her medicines. ARB and Tekturna have been stopped.  She has had recent labs per Renal. All other systems were reviewed and are negative.  Physical Exam: BP 152/52  Pulse 58  Ht 5\' 8"  (1.727 m)  Wt 179 lb (81.194 kg)  BMI 27.22 kg/m2 Patient is in a wheelchair. She is alert and in no acute distress. She smells of tobacco. Skin is warm and dry. Color is normal.  HEENT is unremarkable. Normocephalic/atraumatic. PERRL. Sclera are nonicteric. Neck is supple. No masses. No JVD. Lungs are clear. Cardiac exam shows a regular rate and rhythm. Abdomen is soft. No edema on the right. Prosthesis on the right. Gait and ROM are intact. No gross neurologic deficits noted.   LABORATORY DATA:   Assessment / Plan:

## 2011-05-22 NOTE — Assessment & Plan Note (Signed)
She wants to stay with Crestor. Says Lipitor makes her feel bad. We will continue with the Crestor.

## 2011-06-04 NOTE — H&P (Signed)
  [name], [name] NO.:  [id number]  MEDICAL RECORD NO.:  [id number]          PATIENT TYPE:  REC  LOCATION:  FOOT                         FACILITY:  [facility]  PHYSICIAN:  Ardath Sax, M.D.     DATE OF BIRTH:  07-21-1933  DATE OF ADMISSION:  07/24/2010 DATE OF DISCHARGE:                             HISTORY & PHYSICAL   Tricia [name] is a 77 year old diabetic who has already had a right AK amputation.  She comes here to the clinic because of diabetic ulcers on the dorsal aspect of her left great toe and her left heel.  She has no palpable pulses and certainly has surrounding cellulitis.  She has a great deal of pain.  I tried to debride these under local anesthesia and she could not tolerate it.  She is currently on several medicines including atenolol, clonidine, carvedilol, Crestor, benazepril, aspirin, vitamins, Sanctura, Tekturna, and she is taking Vicodin for the pain.  Other symptoms, she has marked claudication when she walks.  She has a history of coronary artery disease and has had a coronary artery bypass graft.  Because of these findings, she needs vascular studies and a vascular consult, so we are going to obtain these and then go from there.  I think there is a chance that this lady could have stents, which would improve her circulation and perhaps save her foot.     Ardath Sax, M.D.     PP/MEDQ  D:  07/26/2010  T:  07/26/2010  Job:  [id number]  Electronically Signed by Ardath Sax  on 06/04/2011 01:50:32 PM

## 2011-06-04 NOTE — H&P (Signed)
  NAMEDONDRA, RHETT NO.:  [id number]  MEDICAL RECORD NO.:  [id number]          PATIENT TYPE:  REC  LOCATION:  FOOT                         FACILITY:  [facility]  PHYSICIAN:  Ardath Sax, M.D.     DATE OF BIRTH:  October 14, 1933  DATE OF ADMISSION:  07/24/2010 DATE OF DISCHARGE:                             HISTORY & PHYSICAL   She is a 77 year old obese lady who is a type 2 diabetic with hypertension who has severe vascular problems of both of her extremities.  Her right leg has already undergone a right AK amputation. She states that she is a smoker, but is trying to quit.  She needs Vascular consult.  She has seen Dr. Arbie Cookey in the past who apparently did some studies and they finally did a right AK amputation.  I can hear a good Doppler pulse on her dorsalis pedis.  She has an ulcer on her right great toe where her toenail was removed that was infected and she has also got an ulcer on her left heel.  She has claudication, her foot is reasonably warm, and she has some surrounding cellulitis.  She is on several medicines including carvedilol and benazepril, Crestor, Lopid. She states that she smokes 1 or 2 cigarettes a day.  After looking over her ulcers in her leg and talking to the patient, we decided that she was definitely a candidate for HBO.  She had these ulcers cultured and we consulted Vascular for them to give a Vascular consult, which we lined up today.  She will get the consult, this is certainly something that could be treated with hyperbaric oxygen as she does fit the categories having 2 ulcers on her foot and with cellulitis and she is of course diabetic, so we will start with a Vascular consult and go from there.     Ardath Sax, M.D.     PP/MEDQ  D:  07/26/2010  T:  07/26/2010  Job:  [id number]  Electronically Signed by Ardath Sax  on 06/04/2011 01:50:19 PM

## 2011-06-13 ENCOUNTER — Encounter: Payer: Self-pay | Admitting: Cardiovascular Disease

## 2011-06-19 ENCOUNTER — Encounter: Payer: Self-pay | Admitting: Nurse Practitioner

## 2011-06-19 ENCOUNTER — Ambulatory Visit (INDEPENDENT_AMBULATORY_CARE_PROVIDER_SITE_OTHER): Payer: Medicare Other | Admitting: Nurse Practitioner

## 2011-06-19 VITALS — BP 150/62 | HR 58 | Ht 68.5 in | Wt 178.0 lb

## 2011-06-19 DIAGNOSIS — I2510 Atherosclerotic heart disease of native coronary artery without angina pectoris: Secondary | ICD-10-CM

## 2011-06-19 DIAGNOSIS — N189 Chronic kidney disease, unspecified: Secondary | ICD-10-CM

## 2011-06-19 DIAGNOSIS — F17200 Nicotine dependence, unspecified, uncomplicated: Secondary | ICD-10-CM

## 2011-06-19 DIAGNOSIS — I10 Essential (primary) hypertension: Secondary | ICD-10-CM

## 2011-06-19 NOTE — Patient Instructions (Signed)
Stay on your current medicines  We will see you back 4 months.  Try to work on your smoking.   Call the [facility] office at [phone] if you have any questions, problems or concerns.

## 2011-06-19 NOTE — Progress Notes (Signed)
Tricia [name] Date of Birth: May 11, 1934 Medical Record #[id number]  History of Present Illness: Tricia [name] is seen today for a one month check. She is seen for Dr. Swaziland. She has multiple medical issues which includes diastolic heart failure with last admission in October of 2012. She has known CAD with prior CABG, PVD with prior amputation, iliac stenting in January of this year,  valvular heart disease and renal failure. She has seen Dr. Arrie Aran just last week. She has agreed to go to the dialysis classes. She has said that she did not wish to pursue dialysis. Last EF was normal at 55% with grade 1 diastolic dysfunction. She continues to smoke.   She comes in today. She is doing better. She says her blood pressure has improved. She is taking the Norvasc but has not had today. She is bruising. She remains on low dose aspirin and her Plavix. No chest pain. Breathing is ok. No worsening edema.   Current Outpatient Prescriptions on File Prior to Visit  Medication Sig Dispense Refill  . amLODipine (NORVASC) 5 MG tablet Take 1 tablet (5 mg total) by mouth daily.  30 tablet  11  . aspirin 81 MG tablet Take 81 mg by mouth daily.        . carvedilol (COREG) 25 MG tablet Take 25 mg by mouth 2 (two) times daily with a meal.       . clopidogrel (PLAVIX) 75 MG tablet Take 75 mg by mouth daily.        . ergocalciferol (VITAMIN D2) 50000 UNITS capsule Take 50,000 Units by mouth every 21 ( twenty-one) days.       . furosemide (LASIX) 40 MG tablet Take 40 mg by mouth 2 (two) times daily.        Marland Kitchen gemfibrozil (LOPID) 600 MG tablet Take 600 mg by mouth daily.       . hydrALAZINE (APRESOLINE) 50 MG tablet Take 50 mg by mouth 3 (three) times daily.        Marland Kitchen HYDROcodone-acetaminophen (NORCO) 7.5-325 MG per tablet Take 1 tablet by mouth every 6 (six) hours as needed.        . multivitamin (THERAGRAN) per tablet Take 1 tablet by mouth daily.        . rosuvastatin (CRESTOR) 40 MG tablet Take 40 mg by mouth daily.          Allergies  Allergen Reactions  . Codeine     Past Medical History  Diagnosis Date  . Hypertension   . Hyperlipidemia   . Diabetes mellitus     type 2  . Coronary artery disease     post CABG in 1999, with 2-vessel disease -- - LIMA graft to the LAD and a left radial artery graft to the PDA   . Myocardial infarction 1999  . 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 Chronic renal insufficiency   . Tobacco dependency   . CKD (chronic kidney disease)     followed by Dr. Arrie Aran  . Diastolic heart failure   . Aortic stenosis     Mild    Past Surgical History  Procedure Date  . Partial hysterectomy   . Total vaginal hysterectomy   . Leg amputation below knee 1996    right  . Cardiac catheterization   . Coronary artery bypass graft 01/26/1998    Est. EF of 40-45% -- with 2-vessel disease -- LIMA graft to the LAD and a left  radial artery graft to the PDA   . Iliac vein angioplasty / stenting 08/10/2010     Left common iliac stenting    History  Smoking status  . Current Everyday Smoker -- 0.5 packs/day  . Types: Cigarettes  Smokeless tobacco  . Never Used    History  Alcohol Use No    Family History  Problem Relation Age of Onset  . Heart attack Father 23    Review of Systems: The review of systems is per the HPI.  All other systems were reviewed and are negative.  Physical Exam: BP 150/62  Pulse 58  Ht 5' 8.5" (1.74 m)  Wt 178 lb (80.74 kg)  BMI 26.67 kg/m2 Patient appears chronically ill but is plleasant and in no acute distress. Skin is warm and dry. Color is normal.  She is ecchymotic. HEENT is unremarkable. Normocephalic/atraumatic. PERRL. Sclera are nonicteric. Neck is supple. No masses. No JVD. Lungs are clear. Cardiac exam shows a regular rate and rhythm. Harsh outflow murmur noted. Abdomen is soft. Left extremity is without edema. Prosthesis on the right.  Gait is not tested. She is in a wheelchair. ROM are intact. No gross  neurologic deficits noted.  LABORATORY DATA: N/A  Assessment / Plan:

## 2011-06-19 NOTE — Assessment & Plan Note (Signed)
She is planning on going to the dialysis class. She is still not sure as to whether she is going to commit when the time comes. She continues to be followed by renal.

## 2011-06-19 NOTE — Assessment & Plan Note (Signed)
No current chest pain

## 2011-06-19 NOTE — Assessment & Plan Note (Signed)
Smoking cessation is encouraged 

## 2011-06-19 NOTE — Assessment & Plan Note (Signed)
Blood pressure has improved. I have left her on her current regimen. We will see her back in 4 months. Patient is agreeable to this plan and will call if any problems develop in the interim.

## 2011-07-25 ENCOUNTER — Telehealth: Payer: Self-pay | Admitting: Cardiology

## 2011-07-25 ENCOUNTER — Other Ambulatory Visit: Payer: Self-pay | Admitting: *Deleted

## 2011-07-25 DIAGNOSIS — I70219 Atherosclerosis of native arteries of extremities with intermittent claudication, unspecified extremity: Secondary | ICD-10-CM

## 2011-07-25 MED ORDER — CLOPIDOGREL BISULFATE 75 MG PO TABS: 75.0000 mg | ORAL_TABLET | Freq: Every day | ORAL | Status: DC

## 2011-07-25 NOTE — Telephone Encounter (Signed)
Patient called,stating almost out of generic plavix,asking should she continue.Advised to continue 75 mg daily.Refills sent to [facility].

## 2011-07-25 NOTE — Telephone Encounter (Signed)
New problem Pt said he is taking clopidegrel she wants to know should she continue taking this med. She said Dr Arbie Cookey had prescribed. Please call her back

## 2011-08-21 ENCOUNTER — Telehealth: Payer: Self-pay | Admitting: Cardiology

## 2011-08-21 NOTE — Telephone Encounter (Signed)
Patient called stated both hands are bruising.States left hand worse .States bruising goes up left arm.Spoke with Dr.Jordan he advises to continue plavix and aspirin.Advised to call back if needed.

## 2011-08-21 NOTE — Telephone Encounter (Signed)
Pt has a question regarding medication

## 2011-10-19 ENCOUNTER — Encounter: Payer: Self-pay | Admitting: Cardiology

## 2011-10-19 ENCOUNTER — Ambulatory Visit (INDEPENDENT_AMBULATORY_CARE_PROVIDER_SITE_OTHER): Payer: Medicare Other | Admitting: Cardiology

## 2011-10-19 VITALS — BP 148/52 | HR 54 | Ht 68.0 in | Wt 173.0 lb

## 2011-10-19 DIAGNOSIS — I739 Peripheral vascular disease, unspecified: Secondary | ICD-10-CM

## 2011-10-19 DIAGNOSIS — Z720 Tobacco use: Secondary | ICD-10-CM

## 2011-10-19 DIAGNOSIS — I779 Disorder of arteries and arterioles, unspecified: Secondary | ICD-10-CM

## 2011-10-19 DIAGNOSIS — I2510 Atherosclerotic heart disease of native coronary artery without angina pectoris: Secondary | ICD-10-CM

## 2011-10-19 DIAGNOSIS — F17200 Nicotine dependence, unspecified, uncomplicated: Secondary | ICD-10-CM

## 2011-10-19 DIAGNOSIS — I10 Essential (primary) hypertension: Secondary | ICD-10-CM

## 2011-10-19 NOTE — Assessment & Plan Note (Signed)
She remains asymptomatic from a coronary standpoint. We will continue with her risk factor modification. She is status post CABG in 1999.

## 2011-10-19 NOTE — Patient Instructions (Signed)
Continue your current medication.  I will see you again in 6 months.   

## 2011-10-19 NOTE — Assessment & Plan Note (Signed)
She reports that she has cut down to 6 cigarettes per day. Have recommended complete smoking cessation.

## 2011-10-19 NOTE — Assessment & Plan Note (Signed)
She is status post right AKA. One year ago she had stenting of the left iliac with a covered stent. She remains on aspirin and Plavix. At this point I recommended she continue the same.

## 2011-10-19 NOTE — Assessment & Plan Note (Signed)
Blood pressure is mildly elevated today but she reports her readings otherwise have been very good. We will continue with carvedilol, amlodipine, and hydralazine.

## 2011-10-19 NOTE — Progress Notes (Signed)
Tricia [name] Date of Birth: [birth year] Medical Record #[id number]  History of Present Illness: Tricia [name] is seen today for a followup visit. She has done fair well from a cardiac standpoint. She denies any significant shortness of breath or chest pain. She has had urinary tract infections and wants to make sure it's okay to use Estrace cream. She does note a lot of bruising on her arms. It has been recommended by nephrology that she had AV fistula placed. She is still not sure she wants this but is going to attend a dialysis class.  Current Outpatient Prescriptions on File Prior to Visit  Medication Sig Dispense Refill  . amLODipine (NORVASC) 5 MG tablet Take 1 tablet (5 mg total) by mouth daily.  30 tablet  11  . aspirin 81 MG tablet Take 81 mg by mouth daily.        . carvedilol (COREG) 25 MG tablet Take 25 mg by mouth 2 (two) times daily with a meal.       . clopidogrel (PLAVIX) 75 MG tablet Take 1 tablet (75 mg total) by mouth daily.  30 tablet  6  . ergocalciferol (VITAMIN D2) 50000 UNITS capsule Take 50,000 Units by mouth every 21 ( twenty-one) days.       . furosemide (LASIX) 40 MG tablet Take 40 mg by mouth 2 (two) times daily.        Marland Kitchen gemfibrozil (LOPID) 600 MG tablet Take 600 mg by mouth daily.       . hydrALAZINE (APRESOLINE) 50 MG tablet Take 50 mg by mouth 3 (three) times daily.        Marland Kitchen HYDROcodone-acetaminophen (NORCO) 7.5-325 MG per tablet Take 1 tablet by mouth every 6 (six) hours as needed.        . multivitamin (THERAGRAN) per tablet Take 1 tablet by mouth daily.        . rosuvastatin (CRESTOR) 40 MG tablet Take 40 mg by mouth daily.         No Known Allergies  Past Medical History  Diagnosis Date  . Hypertension   . Hyperlipidemia   . Diabetes mellitus     type 2  . Coronary artery disease     post CABG in 1999, with 2-vessel disease -- - LIMA graft to the LAD and a left radial artery graft to the PDA   . Myocardial infarction 1999  . Peripheral vascular  disease     post AKA on right  . Chronic renal insufficiency   . Tobacco dependency   . CKD (chronic kidney disease)     followed by Dr. Arrie Aran  . Diastolic heart failure   . Aortic stenosis     Mild    Past Surgical History  Procedure Date  . Partial hysterectomy   . Total vaginal hysterectomy   . Leg amputation below knee 1996    right  . Cardiac catheterization   . Coronary artery bypass graft 01/26/1998    Est. EF of 40-45% -- with 2-vessel disease -- LIMA graft to the LAD and a left radial artery graft to the PDA   . Iliac vein angioplasty / stenting 08/10/2010     Left common iliac stenting    History  Smoking status  . Current Everyday Smoker -- 0.5 packs/day  . Types: Cigarettes  Smokeless tobacco  . Never Used    History  Alcohol Use No    Family History  Problem Relation Age of Onset  . Heart attack  Father 85    Review of Systems: The review of systems is per the HPI.  All other systems were reviewed and are negative.  Physical Exam: BP 148/52  Pulse 54  Ht 5\' 8"  (1.727 m)  Wt 173 lb (78.472 kg)  BMI 26.30 kg/m2 Patient appears chronically ill but is plleasant and in no acute distress. Skin is warm and dry. Color is normal.  She is ecchymotic. HEENT is unremarkable. Normocephalic/atraumatic. PERRL. Sclera are nonicteric. Neck is supple. No masses. No JVD. Lungs are clear. Cardiac exam shows a regular rate and rhythm. Harsh outflow murmur noted. Abdomen is soft. Left extremity is without edema. Prosthesis on the right. She does have a fair amount of bruising on her upper extremities. Gait is not tested. She is in a wheelchair. ROM are intact. No gross neurologic deficits noted.  LABORATORY DATA:   Assessment / Plan:

## 2011-11-02 ENCOUNTER — Emergency Department (HOSPITAL_COMMUNITY)
Admission: EM | Admit: 2011-11-02 | Discharge: 2011-11-02 | Disposition: A | Discharge status: Home / Self Care | Payer: Medicare Other | Attending: Emergency Medicine | Admitting: Emergency Medicine

## 2011-11-02 ENCOUNTER — Encounter (HOSPITAL_COMMUNITY): Payer: Self-pay | Admitting: Emergency Medicine

## 2011-11-02 ENCOUNTER — Emergency Department (HOSPITAL_COMMUNITY): Payer: Medicare Other

## 2011-11-02 DIAGNOSIS — I2510 Atherosclerotic heart disease of native coronary artery without angina pectoris: Secondary | ICD-10-CM | POA: Insufficient documentation

## 2011-11-02 DIAGNOSIS — N189 Chronic kidney disease, unspecified: Secondary | ICD-10-CM | POA: Insufficient documentation

## 2011-11-02 DIAGNOSIS — I252 Old myocardial infarction: Secondary | ICD-10-CM | POA: Insufficient documentation

## 2011-11-02 DIAGNOSIS — J329 Chronic sinusitis, unspecified: Secondary | ICD-10-CM | POA: Insufficient documentation

## 2011-11-02 DIAGNOSIS — E785 Hyperlipidemia, unspecified: Secondary | ICD-10-CM | POA: Insufficient documentation

## 2011-11-02 DIAGNOSIS — Z79899 Other long term (current) drug therapy: Secondary | ICD-10-CM | POA: Insufficient documentation

## 2011-11-02 DIAGNOSIS — E119 Type 2 diabetes mellitus without complications: Secondary | ICD-10-CM | POA: Insufficient documentation

## 2011-11-02 DIAGNOSIS — I129 Hypertensive chronic kidney disease with stage 1 through stage 4 chronic kidney disease, or unspecified chronic kidney disease: Secondary | ICD-10-CM | POA: Insufficient documentation

## 2011-11-02 DIAGNOSIS — R0602 Shortness of breath: Secondary | ICD-10-CM | POA: Insufficient documentation

## 2011-11-02 DIAGNOSIS — I10 Essential (primary) hypertension: Secondary | ICD-10-CM | POA: Insufficient documentation

## 2011-11-02 LAB — DIFFERENTIAL
Basophils Absolute: 0 10*3/uL (ref 0.0–0.1)
Eosinophils Relative: 2 % (ref 0–5)
Lymphocytes Relative: 37 % (ref 12–46)
Lymphs Abs: 2.2 10*3/uL (ref 0.7–4.0)
Neutro Abs: 3.1 10*3/uL (ref 1.7–7.7)

## 2011-11-02 LAB — CBC
HCT: 31.6 % — ABNORMAL LOW (ref 36.0–46.0)
MCV: 92.9 fL (ref 78.0–100.0)
Platelets: 204 10*3/uL (ref 150–400)
RBC: 3.4 MIL/uL — ABNORMAL LOW (ref 3.87–5.11)
RDW: 13.5 % (ref 11.5–15.5)
WBC: 5.9 10*3/uL (ref 4.0–10.5)

## 2011-11-02 LAB — BASIC METABOLIC PANEL
CO2: 21 mEq/L (ref 19–32)
Chloride: 105 mEq/L (ref 96–112)
Glucose, Bld: 90 mg/dL (ref 70–99)
Sodium: 137 mEq/L (ref 135–145)

## 2011-11-02 LAB — TROPONIN I: Troponin I: <0.3 ng/mL (ref <0.30)

## 2011-11-02 MED ORDER — BENZONATATE 100 MG PO CAPS
100.0000 mg | ORAL_CAPSULE | Freq: Once | ORAL | Status: AC
  Administered 2011-11-02: 100 mg via ORAL
  Filled 2011-11-02: qty 1

## 2011-11-02 MED ORDER — ALBUTEROL SULFATE HFA 108 (90 BASE) MCG/ACT IN AERS
1.0000 | INHALATION_SPRAY | Freq: Once | RESPIRATORY_TRACT | Status: AC
  Administered 2011-11-02: 1 via RESPIRATORY_TRACT
  Filled 2011-11-02: qty 6.7

## 2011-11-02 MED ORDER — ALBUTEROL SULFATE (5 MG/ML) 0.5% IN NEBU
5.0000 mg | INHALATION_SOLUTION | Freq: Once | RESPIRATORY_TRACT | Status: AC
  Administered 2011-11-02: 5 mg via RESPIRATORY_TRACT
  Filled 2011-11-02: qty 1

## 2011-11-02 MED ORDER — BENZONATATE 100 MG PO CAPS: 100.0000 mg | ORAL_CAPSULE | Freq: Three times a day (TID) | ORAL | Status: AC

## 2011-11-02 MED ORDER — IPRATROPIUM BROMIDE 0.02 % IN SOLN
0.5000 mg | Freq: Once | RESPIRATORY_TRACT | Status: AC
  Administered 2011-11-02: 18:00:00 via RESPIRATORY_TRACT
  Filled 2011-11-02: qty 2.5

## 2011-11-02 NOTE — ED Notes (Signed)
Family member reports pt having difficulty breathing x 1 week.  Pt denies difficulty breathing and states she just cant swallow.  Pt called PCP and is currently taking Z-Pack for same.

## 2011-11-02 NOTE — ED Provider Notes (Addendum)
History     CSN: [id number]  Arrival date & time 11/02/11  1628   First MD Initiated Contact with Patient 11/02/11 1728      Chief Complaint  Patient presents with  . Shortness of Breath    (Consider location/radiation/quality/duration/timing/severity/associated sxs/prior treatment) HPI Comments: Patient presents with approximately one week of symptoms of cough and congestion.  The son notes that he had similar symptoms and they were both seen by the primary care physician and given amoxicillin for a possible sinus infection.  After several days of that are not improving so the son called the primary care doctor back and they were prescribed Z-Paks.  The son notes that he is now improved.  The patient is continuing to have cough and complains that she has difficulty swallowing because she coughs.  She can swallow liquids down well and notes that when she has water she can swallow with ease and has no trouble with foods.  She feels that she can't breathe but is primarily up in her sinuses and her nose.  She does not use inhalers at home but does have a prominent smoking history.  Patient has no chest pain.  No fevers.  No nausea or vomiting.  She's here for the persistent coughing and concern that she may have pneumonia.  Patient is a 78 y.o. female presenting with shortness of breath. The history is provided by the patient and a relative. No language interpreter was used.  Shortness of Breath  The onset was gradual. The problem occurs occasionally. The problem is moderate. Associated symptoms include rhinorrhea, sore throat, cough and shortness of breath. Pertinent negatives include no chest pain, no fever and no wheezing.    Past Medical History  Diagnosis Date  . Hypertension   . Hyperlipidemia   . Diabetes mellitus     type 2  . Coronary artery disease     post CABG in 1999, with 2-vessel disease -- - LIMA graft to the LAD and a left radial artery graft to the PDA   . Myocardial  infarction 1999  . 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 Chronic renal insufficiency   . Tobacco dependency   . CKD (chronic kidney disease)     followed by Dr. Arrie Aran  . Diastolic heart failure   . Aortic stenosis     Mild    Past Surgical History  Procedure Date  . Partial hysterectomy   . Total vaginal hysterectomy   . Leg amputation below knee 1996    right  . Cardiac catheterization   . Coronary artery bypass graft 01/26/1998    Est. EF of 40-45% -- with 2-vessel disease -- LIMA graft to the LAD and a left radial artery graft to the PDA   . Iliac vein angioplasty / stenting 08/10/2010     Left common iliac stenting    Family History  Problem Relation Age of Onset  . Heart attack Father 47    History  Substance Use Topics  . Smoking status: Current Everyday Smoker -- 0.5 packs/day    Types: Cigarettes  . Smokeless tobacco: Never Used  . Alcohol Use: No    OB History    Grav Para Term Preterm Abortions TAB SAB Ect Mult Living                  Review of Systems  Constitutional: Negative.  Negative for fever and chills.  HENT: Positive for sore throat and  rhinorrhea.   Eyes: Negative.  Negative for discharge and redness.  Respiratory: Positive for cough and shortness of breath. Negative for wheezing.   Cardiovascular: Negative.  Negative for chest pain.  Gastrointestinal: Negative.  Negative for nausea, vomiting, abdominal pain and diarrhea.  Genitourinary: Negative.  Negative for dysuria and vaginal discharge.  Musculoskeletal: Negative.  Negative for back pain.  Skin: Negative.  Negative for color change and rash.  Neurological: Negative.  Negative for syncope and headaches.  Hematological: Negative.  Negative for adenopathy.  Psychiatric/Behavioral: Negative.  Negative for confusion.  All other systems reviewed and are negative.    Allergies  Review of patient's allergies indicates no known allergies.  Home Medications   Current  Outpatient Rx  Name Route Sig Dispense Refill  . AMLODIPINE BESYLATE 5 MG PO TABS Oral Take 1 tablet (5 mg total) by mouth daily. 30 tablet 11  . AMOXICILLIN 500 MG PO CAPS Oral Take 500 mg by mouth every 8 (eight) hours.    . ASPIRIN 81 MG PO TABS Oral Take 81 mg by mouth daily.      . AZITHROMYCIN 250 MG PO TABS Oral Take 250-500 mg by mouth daily. Take 2 tablets on first day. Take one tablet daily for 5 days. Starting 10/31/11    . CARVEDILOL 25 MG PO TABS Oral Take 25 mg by mouth 2 (two) times daily with a meal.     . CLOPIDOGREL BISULFATE 75 MG PO TABS Oral Take 1 tablet (75 mg total) by mouth daily. 30 tablet 6  . ERGOCALCIFEROL 50000 UNITS PO CAPS Oral Take 50,000 Units by mouth every 21 ( twenty-one) days.     Marland Kitchen ESTRADIOL 0.1 MG/GM VA CREA Vaginal Place 2 g vaginally daily.     . FUROSEMIDE 40 MG PO TABS Oral Take 40 mg by mouth 2 (two) times daily.      Marland Kitchen GEMFIBROZIL 600 MG PO TABS Oral Take 600 mg by mouth daily.     Marland Kitchen HYDRALAZINE HCL 50 MG PO TABS Oral Take 50 mg by mouth 3 (three) times daily.      Marland Kitchen HYDROCODONE-ACETAMINOPHEN 7.5-325 MG PO TABS Oral Take 1 tablet by mouth every 6 (six) hours as needed. For pain    . MULTIVITAMINS PO TABS Oral Take 1 tablet by mouth daily.      Marland Kitchen ROSUVASTATIN CALCIUM 40 MG PO TABS Oral Take 40 mg by mouth daily.       BP 208/52  Pulse 64  Temp(Src) 98.3 F (36.8 C) (Oral)  Resp 16  SpO2 96%  Physical Exam  Nursing note and vitals reviewed. Constitutional: She is oriented to person, place, and time. She appears well-developed and well-nourished.  Non-toxic appearance. She does not have a sickly appearance.  HENT:  Head: Normocephalic and atraumatic.  Eyes: Conjunctivae, EOM and lids are normal. Pupils are equal, round, and reactive to light. No scleral icterus.  Neck: Trachea normal and normal range of motion. Neck supple.  Cardiovascular: Normal rate, regular rhythm and normal heart sounds.   Pulmonary/Chest: Effort normal. No respiratory  distress. She has no wheezes. She has no rales. She exhibits no tenderness.       Mild decrease in breath sounds but no rhonchi or frank wheezing  Abdominal: Soft. Normal appearance. There is no tenderness. There is no rebound, no guarding and no CVA tenderness.  Musculoskeletal: Normal range of motion. She exhibits no edema.       Right AKA  Neurological: She is alert and  oriented to person, place, and time. She has normal strength.  Skin: Skin is warm, dry and intact. No rash noted.  Psychiatric: She has a normal mood and affect. Her behavior is normal. Judgment and thought content normal.    ED Course  Procedures (including critical care time)  Results for orders placed during the hospital encounter of 11/02/11  CBC      Component Value Range   WBC 5.9  4.0 - 10.5 (K/uL)   RBC 3.40 (*) 3.87 - 5.11 (MIL/uL)   Hemoglobin 10.0 (*) 12.0 - 15.0 (g/dL)   HCT 60.4 (*) 54.0 - 46.0 (%)   MCV 92.9  78.0 - 100.0 (fL)   MCH 29.4  26.0 - 34.0 (pg)   MCHC 31.6  30.0 - 36.0 (g/dL)   RDW 98.1  19.1 - 47.8 (%)   Platelets 204  150 - 400 (K/uL)  DIFFERENTIAL      Component Value Range   Neutrophils Relative 53  43 - 77 (%)   Neutro Abs 3.1  1.7 - 7.7 (K/uL)   Lymphocytes Relative 37  12 - 46 (%)   Lymphs Abs 2.2  0.7 - 4.0 (K/uL)   Monocytes Relative 8  3 - 12 (%)   Monocytes Absolute 0.5  0.1 - 1.0 (K/uL)   Eosinophils Relative 2  0 - 5 (%)   Eosinophils Absolute 0.1  0.0 - 0.7 (K/uL)   Basophils Relative 0  0 - 1 (%)   Basophils Absolute 0.0  0.0 - 0.1 (K/uL)  BASIC METABOLIC PANEL      Component Value Range   Sodium 137  135 - 145 (mEq/L)   Potassium 4.1  3.5 - 5.1 (mEq/L)   Chloride 105  96 - 112 (mEq/L)   CO2 21  19 - 32 (mEq/L)   Glucose, Bld 90  70 - 99 (mg/dL)   BUN 35 (*) 6 - 23 (mg/dL)   Creatinine, Ser 2.95 (*) 0.50 - 1.10 (mg/dL)   Calcium 8.6  8.4 - 62.1 (mg/dL)   GFR calc non Af Amer 15 (*) >90 (mL/min)   GFR calc Af Amer 18 (*) >90 (mL/min)  TROPONIN I      Component  Value Range   Troponin I <0.30  <0.30 (ng/mL)   Dg Chest 2 View  11/02/2011  *RADIOLOGY REPORT*  Clinical Data: Chest pain on difficulty swallowing.  Symptoms for 3 days.  History hypertension and smoking.  CHEST - 2 VIEW  Comparison: 04/26/2011  Findings: Patient has had median sternotomy CABG.  Heart is enlarged.  There is mild prominence of interstitial markings, likely chronic.  No pleural effusions.  No focal consolidations.  IMPRESSION:  1.  Cardiomegaly. 2.  No overt alveolar edema.  Original Report Authenticated By: Patterson Hammersmith, M.D.       Date: 11/02/2011  Rate: 56  Rhythm: normal sinus rhythm  QRS Axis: normal  Intervals: PR prolonged  ST/T Wave abnormalities: nonspecific ST/T changes  Conduction Disutrbances:first-degree A-V block  and right bundle branch block  Narrative Interpretation:   Old EKG Reviewed: unchanged from 04/26/2011     MDM  Patient with likely viral cause for her persistent symptoms and she is Re: been on amoxicillin and is currently on a Z-Pak without relief.  Given patient's smoking history I will obtain a chest x-ray.  While there is no frank wheezing she does describe some shortness of breath and I will give her one nebulizer treatment to see if it has some affect.  Her oxygen saturations are 96% which is likely appropriate for her with her smoking history.  We will continue to monitor her blood pressure here as patient does have known history of high blood pressure and has been coughing.        Nat Christen, MD 11/02/11 1743  Nat Christen, MD 11/02/11 [phone]  Patient with likely viral cause for her sinusitis symptoms.  Her chest x-ray is normal.  Her symptoms are not consistent with CHF at this time.  Patient may have had some relief with the albuterol inhaler so we will give her instructions on albuterol use at home.  The [employer] also help with her cough so sent her home with that for her cough.  She's going to followup  with her primary care physician next week as well.  Nat Christen, MD 11/02/11 2100

## 2011-11-02 NOTE — Discharge Instructions (Signed)
Sinusitis Sinuses are air pockets within the bones of your face. The growth of bacteria within a sinus leads to infection. The infection prevents the sinuses from draining. This infection is called sinusitis. SYMPTOMS  There will be different areas of pain depending on which sinuses have become infected.  The maxillary sinuses often produce pain beneath the eyes.   Frontal sinusitis may cause pain in the middle of the forehead and above the eyes.  Other problems (symptoms) include:  Toothaches.   Colored, pus-like (purulent) drainage from the nose.   Swelling, warmth, and tenderness over the sinus areas may be signs of infection.  TREATMENT  Sinusitis is most often determined by an exam.X-rays may be taken. If x-rays have been taken, make sure you obtain your results or find out how you are to obtain them. Your caregiver may give you medications (antibiotics). These are medications that will help kill the bacteria causing the infection. You may also be given a medication (decongestant) that helps to reduce sinus swelling.  HOME CARE INSTRUCTIONS   Only take over-the-counter or prescription medicines for pain, discomfort, or fever as directed by your caregiver.   Drink extra fluids. Fluids help thin the mucus so your sinuses can drain more easily.   Applying either moist heat or ice packs to the sinus areas may help relieve discomfort.   Use saline nasal sprays to help moisten your sinuses. The sprays can be found at your local drugstore.  SEEK IMMEDIATE MEDICAL CARE IF:  You have a fever.   You have increasing pain, severe headaches, or toothache.   You have nausea, vomiting, or drowsiness.   You develop unusual swelling around the face or trouble seeing.  MAKE SURE YOU:   Understand these instructions.   Will watch your condition.   Will get help right away if you are not doing well or get worse.  Document Released: 07/02/2005 Document Revised: 06/21/2011 Document Reviewed:  01/29/2007 [facility] Patient Information 2012 [place], [place].  Albuterol inhalation aerosol What is this medicine? ALBUTEROL (al Gaspar Bidding) is a bronchodilator. It helps open up the airways in your lungs to make it easier to breathe. This medicine is used to treat and to prevent bronchospasm. This medicine may be used for other purposes; ask your health care provider or pharmacist if you have questions. What should I tell my health care provider before I take this medicine? They need to know if you have any of the following conditions: -diabetes -heart disease or irregular heartbeat -high blood pressure -pheochromocytoma -seizures -thyroid disease -an unusual or allergic reaction to albuterol, levalbuterol, sulfites, other medicines, foods, dyes, or preservatives -pregnant or trying to get pregnant -breast-feeding How should I use this medicine? This medicine is for inhalation through the mouth. Follow the directions on your prescription label. Take your medicine at regular intervals. Do not use more often than directed. Make sure that you are using your inhaler correctly. Ask you doctor or health care provider if you have any questions. Use this medicine before you use any other inhaler. Wait 5 minutes or more before between using different inhalers. Talk to your pediatrician regarding the use of this medicine in children. Special care may be needed. Overdosage: If you think you have taken too much of this medicine contact a poison control center or emergency room at once. NOTE: This medicine is only for you. Do not share this medicine with others. What if I miss a dose? If you miss a dose, use it as  soon as you can. If it is almost time for your next dose, use only that dose. Do not use double or extra doses. What may interact with this medicine? -anti-infectives like chloroquine and pentamidine -caffeine -cisapride -diuretics -medicines for colds -medicines for depression or for  emotional or psychotic conditions -medicines for weight loss including some herbal products -methadone -some antibiotics like clarithromycin, erythromycin, levofloxacin, and linezolid -some heart medicines -steroid hormones like dexamethasone, cortisone, hydrocortisone -theophylline -thyroid hormones This list may not describe all possible interactions. Give your health care provider a list of all the medicines, herbs, non-prescription drugs, or dietary supplements you use. Also tell them if you smoke, drink alcohol, or use illegal drugs. Some items may interact with your medicine. What should I watch for while using this medicine? Tell your doctor or health care professional if your symptoms do not improve. Do not use extra albuterol. If your asthma or bronchitis gets worse while you are using this medicine, call your doctor right away. If your mouth gets dry try chewing sugarless gum or sucking hard candy. Drink water as directed. What side effects may I notice from receiving this medicine? Side effects that you should report to your doctor or health care professional as soon as possible: -allergic reactions like skin rash, itching or hives, swelling of the face, lips, or tongue -breathing problems -chest pain -feeling faint or lightheaded, falls -high blood pressure -irregular heartbeat -fever -muscle cramps or weakness -pain, tingling, numbness in the hands or feet -vomiting Side effects that usually do not require medical attention (report to your doctor or health care professional if they continue or are bothersome): -cough -difficulty sleeping -headache -nervousness or trembling -stomach upset -stuffy or runny nose -throat irritation -unusual taste This list may not describe all possible side effects. Call your doctor for medical advice about side effects. You may report side effects to FDA at 1-800-FDA-1088. Where should I keep my medicine? Keep out of the reach of  children. Store at room temperature between 15 and 30 degrees C (59 and 86 degrees F). The contents are under pressure and may burst when exposed to heat or flame. Do not freeze. This medicine does not work as well if it is too cold. Throw away any unused medicine after the expiration date. Inhalers need to be thrown away after the labeled number of puffs have been used or by the expiration date; whichever comes first. Ventolin HFA should be thrown away 12 months after removing from foil pouch. Check the instructions that come with your medicine. NOTE: This sheet is a summary. It may not cover all possible information. If you have questions about this medicine, talk to your doctor, pharmacist, or health care provider.  2012, [employer]. (11/17/2010 11:00:52 AM)

## 2011-11-02 NOTE — ED Notes (Addendum)
Pt wants md made aware that she takes a large amount of pain meds and will be needing them soon or her leg will begin to jerk. Advised pt to speak to md and that I would check mar for orders.

## 2011-12-06 ENCOUNTER — Other Ambulatory Visit: Payer: Self-pay | Admitting: *Deleted

## 2011-12-06 MED ORDER — FUROSEMIDE 40 MG PO TABS: 40.0000 mg | ORAL_TABLET | Freq: Two times a day (BID) | ORAL | Status: DC

## 2011-12-06 NOTE — Telephone Encounter (Signed)
Fax Received. Refill Completed. Elese Rane Chowoe (R.M.A)   

## 2011-12-25 ENCOUNTER — Encounter: Payer: Self-pay | Admitting: Cardiology

## 2012-01-15 ENCOUNTER — Other Ambulatory Visit: Payer: Self-pay

## 2012-01-15 MED ORDER — HYDRALAZINE HCL 50 MG PO TABS: 50.0000 mg | ORAL_TABLET | Freq: Three times a day (TID) | ORAL | Status: DC

## 2012-03-11 ENCOUNTER — Other Ambulatory Visit: Payer: Self-pay | Admitting: Cardiology

## 2012-03-13 ENCOUNTER — Other Ambulatory Visit: Payer: Self-pay | Admitting: *Deleted

## 2012-03-13 MED ORDER — FUROSEMIDE 40 MG PO TABS: 40.0000 mg | ORAL_TABLET | Freq: Two times a day (BID) | ORAL | Status: DC

## 2012-03-13 NOTE — Telephone Encounter (Signed)
Pt wants #[phone] and refill please

## 2012-04-18 ENCOUNTER — Ambulatory Visit (INDEPENDENT_AMBULATORY_CARE_PROVIDER_SITE_OTHER): Payer: Medicare Other | Admitting: Cardiology

## 2012-04-18 ENCOUNTER — Encounter: Payer: Self-pay | Admitting: Cardiology

## 2012-04-18 VITALS — BP 140/60 | HR 56 | Ht 68.0 in | Wt 186.0 lb

## 2012-04-18 DIAGNOSIS — I739 Peripheral vascular disease, unspecified: Secondary | ICD-10-CM

## 2012-04-18 DIAGNOSIS — I10 Essential (primary) hypertension: Secondary | ICD-10-CM

## 2012-04-18 DIAGNOSIS — N184 Chronic kidney disease, stage 4 (severe): Secondary | ICD-10-CM

## 2012-04-18 DIAGNOSIS — I2510 Atherosclerotic heart disease of native coronary artery without angina pectoris: Secondary | ICD-10-CM

## 2012-04-18 NOTE — Progress Notes (Signed)
[name] Date of Birth: [birth year] Medical Record #[id number]  History of Present Illness: Annalina is seen today for a followup visit. She has done very well from a cardiac standpoint. She does report that her creatinine was recently increased to 3.2. She is followed by nephrology and has begun discussion about possible dialysis. She reports that she was feeling very poorly with symptoms of fatigue and feeling that she was in a fog all the time. She states she had a hard time getting out of bed in the morning. She was developing pain, itching, and burning in her hands. She stop taking Plavix and the symptoms all resolved and she is feeling much better now. Her energy level is very good. Her hand discomfort has resolved. She denies any left leg claudication. She has no dyspnea or palpitations.  Current Outpatient Prescriptions on File Prior to Visit  Medication Sig Dispense Refill  . amLODipine (NORVASC) 5 MG tablet Take 1 tablet (5 mg total) by mouth daily.  30 tablet  11  . aspirin 81 MG tablet Take 81 mg by mouth daily.        . carvedilol (COREG) 25 MG tablet Take 25 mg by mouth. Taking 1.5Tablets BID      . ergocalciferol (VITAMIN D2) 50000 UNITS capsule Take 50,000 Units by mouth every 21 ( twenty-one) days.       . furosemide (LASIX) 40 MG tablet Take 1 tablet (40 mg total) by mouth 2 (two) times daily.  60 tablet  6  . gemfibrozil (LOPID) 600 MG tablet Take 600 mg by mouth daily.       . hydrALAZINE (APRESOLINE) 50 MG tablet Take 1 tablet (50 mg total) by mouth 3 (three) times daily.  90 tablet  5  . HYDROcodone-acetaminophen (NORCO) 7.5-325 MG per tablet Take 1 tablet by mouth every 6 (six) hours as needed. For pain      . multivitamin (THERAGRAN) per tablet Take 1 tablet by mouth daily.        . rosuvastatin (CRESTOR) 40 MG tablet Take 40 mg by mouth daily.         No Known Allergies  Past Medical History  Diagnosis Date  . Hypertension   . Hyperlipidemia   . Diabetes  mellitus     type 2  . Coronary artery disease     post CABG in 1999, with 2-vessel disease -- - LIMA graft to the LAD and a left radial artery graft to the PDA   . Myocardial infarction 1999  . 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 Chronic renal insufficiency   . Tobacco dependency   . CKD (chronic kidney disease)     followed by Dr. Arrie Aran  . Diastolic heart failure   . Aortic stenosis     Mild    Past Surgical History  Procedure Date  . Partial hysterectomy   . Total vaginal hysterectomy   . Leg amputation below knee 1996    right  . Cardiac catheterization   . Coronary artery bypass graft 01/26/1998    Est. EF of 40-45% -- with 2-vessel disease -- LIMA graft to the LAD and a left radial artery graft to the PDA   . Iliac vein angioplasty / stenting 08/10/2010     Left common iliac stenting    History  Smoking status  . Current Every Day Smoker -- 0.5 packs/day  . Types: Cigarettes  Smokeless tobacco  . Never Used  History  Alcohol Use No    Family History  Problem Relation Age of Onset  . Heart attack Father 6    Review of Systems: The review of systems is per the HPI.  All other systems were reviewed and are negative.  Physical Exam: BP 140/60  Pulse 56  Ht 5\' 8"  (1.727 m)  Wt 186 lb (84.369 kg)  BMI 28.28 kg/m2  SpO2 94% Patient appears chronically ill but is plleasant and in no acute distress. Skin is warm and dry. Color is normal.  She is ecchymotic. HEENT is unremarkable. Normocephalic/atraumatic. PERRL. Sclera are nonicteric. Neck is supple. No masses. No JVD. Lungs are clear. Cardiac exam shows a regular rate and rhythm. Harsh outflow murmur noted. Abdomen is soft. Left extremity is without edema. Prosthesis on the right.   She is in a wheelchair. ROM are intact. No gross neurologic deficits noted.  LABORATORY DATA:   Assessment / Plan: 1. Coronary disease status post CABG in 1999. She remains asymptomatic. Continue risk  factor modification. Current antianginal therapy includes aspirin, carvedilol, and amlodipine.  2. PAD. Status post right AKA. Status post stenting of the left iliac with a covered stent. I think it is fine for her to remain on aspirin at this time. Stop Plavix given her intolerance.  3. Tobacco dependence. I have again encouraged patient to stop smoking.  4. Hypertension, under fair control. Carvedilol recently increased to 25 mg 3 times a day.  5. Chronic kidney disease stage IV. Followed by nephrology.

## 2012-04-18 NOTE — Patient Instructions (Signed)
You may take Coreg 25 mg three times a day instead of 1.5 tablets twice a day.  Continue your current therapy.  I will see you again in 6 months.

## 2012-10-01 ENCOUNTER — Other Ambulatory Visit: Payer: Self-pay

## 2012-10-01 MED ORDER — HYDRALAZINE HCL 50 MG PO TABS: 50.0000 mg | ORAL_TABLET | Freq: Three times a day (TID) | ORAL | Status: DC

## 2012-10-15 ENCOUNTER — Encounter: Payer: Self-pay | Admitting: Cardiology

## 2012-10-15 ENCOUNTER — Ambulatory Visit (INDEPENDENT_AMBULATORY_CARE_PROVIDER_SITE_OTHER): Payer: Medicare Other | Admitting: Cardiology

## 2012-10-15 VITALS — BP 124/60 | HR 61 | Ht 68.0 in

## 2012-10-15 DIAGNOSIS — I739 Peripheral vascular disease, unspecified: Secondary | ICD-10-CM

## 2012-10-15 DIAGNOSIS — I2510 Atherosclerotic heart disease of native coronary artery without angina pectoris: Secondary | ICD-10-CM

## 2012-10-15 DIAGNOSIS — N184 Chronic kidney disease, stage 4 (severe): Secondary | ICD-10-CM

## 2012-10-15 DIAGNOSIS — I10 Essential (primary) hypertension: Secondary | ICD-10-CM

## 2012-10-15 DIAGNOSIS — F17200 Nicotine dependence, unspecified, uncomplicated: Secondary | ICD-10-CM

## 2012-10-15 NOTE — Patient Instructions (Addendum)
Continue your current therapy  You may take any antihistamine for your allergies.( Benadryl, allegra, zyrtec, claritin )Just avoid decongestants.  I will see you again in 6 months.

## 2012-10-16 NOTE — Progress Notes (Signed)
Tricia [name] Date of Birth: Mar 20, 1934 Medical Record #[id number]  History of Present Illness: Tricia [name] is seen today for a followup visit. She states she is currently doing well. Amlodipine had to be discontinued because of edema. She is followed closely by nephrology and apparently her kidney function has been stable. She does continue to smoke. She denies any symptoms of chest pain, dyspnea, or palpitations.  Current Outpatient Prescriptions on File Prior to Visit  Medication Sig Dispense Refill  . calcitRIOL (ROCALTROL) 0.25 MCG capsule Take by mouth. M,W,F      . carvedilol (COREG) 25 MG tablet Take 25 mg by mouth. Taking 1.5Tablets BID      . ergocalciferol (VITAMIN D2) 50000 UNITS capsule Take 50,000 Units by mouth every 21 ( twenty-one) days.       . furosemide (LASIX) 40 MG tablet Take 1 tablet (40 mg total) by mouth 2 (two) times daily.  60 tablet  6  . hydrALAZINE (APRESOLINE) 50 MG tablet Take 1 tablet (50 mg total) by mouth 3 (three) times daily.  90 tablet  6  . HYDROcodone-acetaminophen (NORCO) 7.5-325 MG per tablet Take 1 tablet by mouth every 6 (six) hours as needed. For pain      . multivitamin (THERAGRAN) per tablet Take 1 tablet by mouth daily.        . rosuvastatin (CRESTOR) 40 MG tablet Take 40 mg by mouth daily.        No current facility-administered medications on file prior to visit.    No Known Allergies  Past Medical History  Diagnosis Date  . Hypertension   . Hyperlipidemia   . Diabetes mellitus     type 2  . Coronary artery disease     post CABG in 1999, with 2-vessel disease -- - LIMA graft to the LAD and a left radial artery graft to the PDA   . Myocardial infarction 1999  . 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 Chronic renal insufficiency   . Tobacco dependency   . CKD (chronic kidney disease)     followed by Dr. Arrie [name]  . Diastolic heart failure   . Aortic stenosis     Mild    Past Surgical History  Procedure  Laterality Date  . Partial hysterectomy    . Total vaginal hysterectomy    . 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 Cardiac catheterization    . Coronary artery bypass graft  01/26/1998    Est. EF of 40-45% -- with 2-vessel disease -- LIMA graft to the LAD and a left radial artery graft to the PDA   . Iliac vein angioplasty / stenting  08/10/2010     Left common iliac stenting    History  Smoking status  . Current Every Day Smoker -- 0.50 packs/day  . Types: Cigarettes  Smokeless tobacco  . Never Used    History  Alcohol Use No    Family History  Problem Relation Age of Onset  . Heart attack Father 107    Review of Systems: The review of systems is per the HPI.  All other systems were reviewed and are negative.  Physical Exam: BP 124/60  Pulse 61  Ht 5\' 8"  (1.727 m)  SpO2 96% Patient appears chronically ill but is plleasant and in no acute distress. Skin is warm and dry. Color is normal.  She is ecchymotic. HEENT is unremarkable. Normocephalic/atraumatic. PERRL. Sclera are nonicteric. Neck  is supple. No masses. No JVD. Lungs are clear. Cardiac exam shows a regular rate and rhythm. Harsh outflow murmur noted. Abdomen is soft. Left extremity is without edema. Prosthesis on the right.   She is in a wheelchair. ROM are intact. No gross neurologic deficits noted.  LABORATORY DATA:   Assessment / Plan: 1. Coronary disease status post CABG in 1999. She remains asymptomatic. Continue risk factor modification. Current antianginal therapy includes aspirin and carvedilol.  2. PAD. Status post right AKA. Status post stenting of the left iliac with a covered stent. I think it is fine for her to remain on aspirin at this time. Stop Plavix given her intolerance.  3. Tobacco dependence. I have again encouraged patient to stop smoking.  4. Hypertension, under good control today.  5. Chronic kidney disease stage IV. Followed by nephrology.

## 2012-10-22 ENCOUNTER — Other Ambulatory Visit: Payer: Self-pay

## 2012-10-22 MED ORDER — FUROSEMIDE 40 MG PO TABS: 40.0000 mg | ORAL_TABLET | Freq: Two times a day (BID) | ORAL | Status: AC

## 2013-04-08 ENCOUNTER — Encounter: Payer: Self-pay | Admitting: Cardiology

## 2013-04-09 ENCOUNTER — Ambulatory Visit (INDEPENDENT_AMBULATORY_CARE_PROVIDER_SITE_OTHER): Payer: Medicare Other | Admitting: Cardiology

## 2013-04-09 ENCOUNTER — Encounter: Payer: Self-pay | Admitting: Cardiology

## 2013-04-09 VITALS — BP 114/44 | Ht 68.0 in | Wt 181.0 lb

## 2013-04-09 DIAGNOSIS — I10 Essential (primary) hypertension: Secondary | ICD-10-CM

## 2013-04-09 DIAGNOSIS — I2510 Atherosclerotic heart disease of native coronary artery without angina pectoris: Secondary | ICD-10-CM

## 2013-04-09 DIAGNOSIS — I739 Peripheral vascular disease, unspecified: Secondary | ICD-10-CM

## 2013-04-09 DIAGNOSIS — N184 Chronic kidney disease, stage 4 (severe): Secondary | ICD-10-CM

## 2013-04-09 NOTE — Patient Instructions (Signed)
Continue your current therapy  I will see you in 6 months  Quit smoking 

## 2013-04-09 NOTE — Progress Notes (Signed)
Tricia [name] Date of Birth: 06-20-1934 Medical Record #[id number]  History of Present Illness: Tricia [name] is seen today for a followup visit.She is followed closely by nephrology and apparently her kidney function has been stable with recent creatinine of 3.5.Marland Kitchen She does continue to smoke. She denies any symptoms of chest pain, dyspnea, or palpitations. Recently she was placed on a new medication by urology. She states she felt awful after this and was extremely fatigued. Her blood pressure shot up to over 200. She stop taking the medication a week ago and is just now beginning to feel better.  Current Outpatient Prescriptions on File Prior to Visit  Medication Sig Dispense Refill  . calcitRIOL (ROCALTROL) 0.25 MCG capsule Take by mouth. M,W,F      . carvedilol (COREG) 25 MG tablet Take 25 mg by mouth. Taking 1.5Tablets BID      . ergocalciferol (VITAMIN D2) 50000 UNITS capsule Take 50,000 Units by mouth every 21 ( twenty-one) days.       . furosemide (LASIX) 40 MG tablet Take 1 tablet (40 mg total) by mouth 2 (two) times daily.  180 tablet  3  . hydrALAZINE (APRESOLINE) 50 MG tablet Take 1 tablet (50 mg total) by mouth 3 (three) times daily.  90 tablet  6  . HYDROcodone-acetaminophen (NORCO) 7.5-325 MG per tablet Take 1 tablet by mouth every 6 (six) hours as needed. For pain      . multivitamin (THERAGRAN) per tablet Take 1 tablet by mouth daily.        . rosuvastatin (CRESTOR) 40 MG tablet Take 40 mg by mouth daily.       . Trospium Chloride 60 MG CP24 daily.        No current facility-administered medications on file prior to visit.    No Known Allergies  Past Medical History  Diagnosis Date  . Hypertension   . Hyperlipidemia   . Diabetes mellitus     type 2  . Coronary artery disease     post CABG in 1999, with 2-vessel disease -- - LIMA graft to the LAD and a left radial artery graft to the PDA   . Myocardial infarction 1999  . Peripheral vascular disease     post AKA on right   . Chronic renal insufficiency   . Tobacco dependency   . CKD (chronic kidney disease)     followed by Dr. Arrie Aran  . Diastolic heart failure   . Aortic stenosis     Mild    Past Surgical History  Procedure Laterality Date  . Partial hysterectomy    . Total vaginal hysterectomy    . 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 Cardiac catheterization    . Coronary artery bypass graft  01/26/1998    Est. EF of 40-45% -- with 2-vessel disease -- LIMA graft to the LAD and a left radial artery graft to the PDA   . Iliac vein angioplasty / stenting  08/10/2010     Left common iliac stenting    History  Smoking status  . Current Every Day Smoker -- 0.50 packs/day  . Types: Cigarettes  Smokeless tobacco  . Never Used    History  Alcohol Use No    Family History  Problem Relation Age of Onset  . Heart attack Father 53    Review of Systems: The review of systems is per the HPI.  All other systems were reviewed and are negative.  Physical Exam:  BP 114/44  Ht 5\' 8"  (1.727 m)  Wt 82.101 kg (181 lb)  BMI 27.53 kg/m2 Patient appears chronically ill but is plleasant and in no acute distress. Skin is warm and dry. Color is normal.  She is ecchymotic. HEENT is unremarkable. Normocephalic/atraumatic. PERRL. Sclera are nonicteric. Neck is supple. No masses. No JVD. Lungs are clear. Cardiac exam shows a regular rate and rhythm. Harsh outflow murmur noted. Abdomen is soft. Left extremity is without edema. Prosthesis on the right.   She is in a wheelchair. ROM are intact. No gross neurologic deficits noted.  LABORATORY DATA:  ECG today demonstrates sinus bradycardia with a rate of 51 beats per minute. There is a first degree AV block and a right bundle branch block. There is evidence of an old inferior infarction.  Assessment / Plan: 1. Coronary disease status post CABG in 1999. She remains asymptomatic. Continue risk factor modification. Current antianginal therapy includes aspirin  and carvedilol.  2. PAD. Status post right AKA. Status post stenting of the left iliac with a covered stent. I think it is fine for her to remain on aspirin at this time. History of intolerance to Plavix.  3. Tobacco dependence. I have again encouraged patient to stop smoking.  4. Hypertension, under good control today.  5. Chronic kidney disease stage IV. Followed by nephrology.

## 2013-07-28 ENCOUNTER — Inpatient Hospital Stay (HOSPITAL_COMMUNITY)
Admission: EM | Admit: 2013-07-28 | Discharge: 2013-08-03 | DRG: 291 | Disposition: A | Discharge status: Home / Self Care | Payer: Medicare Other | Attending: Cardiology | Admitting: Cardiology

## 2013-07-28 ENCOUNTER — Emergency Department (HOSPITAL_COMMUNITY): Payer: Medicare Other

## 2013-07-28 ENCOUNTER — Encounter (HOSPITAL_COMMUNITY): Payer: Self-pay | Admitting: Emergency Medicine

## 2013-07-28 DIAGNOSIS — I161 Hypertensive emergency: Secondary | ICD-10-CM

## 2013-07-28 DIAGNOSIS — I517 Cardiomegaly: Secondary | ICD-10-CM | POA: Diagnosis present

## 2013-07-28 DIAGNOSIS — J811 Chronic pulmonary edema: Secondary | ICD-10-CM | POA: Diagnosis present

## 2013-07-28 DIAGNOSIS — I248 Other forms of acute ischemic heart disease: Secondary | ICD-10-CM | POA: Diagnosis present

## 2013-07-28 DIAGNOSIS — Z6825 Body mass index (BMI) 25.0-25.9, adult: Secondary | ICD-10-CM

## 2013-07-28 DIAGNOSIS — E669 Obesity, unspecified: Secondary | ICD-10-CM | POA: Diagnosis present

## 2013-07-28 DIAGNOSIS — S78119A Complete traumatic amputation at level between unspecified hip and knee, initial encounter: Secondary | ICD-10-CM

## 2013-07-28 DIAGNOSIS — F411 Generalized anxiety disorder: Secondary | ICD-10-CM | POA: Diagnosis present

## 2013-07-28 DIAGNOSIS — R079 Chest pain, unspecified: Secondary | ICD-10-CM

## 2013-07-28 DIAGNOSIS — E1159 Type 2 diabetes mellitus with other circulatory complications: Secondary | ICD-10-CM

## 2013-07-28 DIAGNOSIS — Z8249 Family history of ischemic heart disease and other diseases of the circulatory system: Secondary | ICD-10-CM

## 2013-07-28 DIAGNOSIS — M4000 Postural kyphosis, site unspecified: Secondary | ICD-10-CM | POA: Diagnosis present

## 2013-07-28 DIAGNOSIS — F329 Major depressive disorder, single episode, unspecified: Secondary | ICD-10-CM | POA: Diagnosis present

## 2013-07-28 DIAGNOSIS — F17200 Nicotine dependence, unspecified, uncomplicated: Secondary | ICD-10-CM | POA: Diagnosis present

## 2013-07-28 DIAGNOSIS — F3289 Other specified depressive episodes: Secondary | ICD-10-CM | POA: Diagnosis present

## 2013-07-28 DIAGNOSIS — I509 Heart failure, unspecified: Secondary | ICD-10-CM | POA: Diagnosis present

## 2013-07-28 DIAGNOSIS — N184 Chronic kidney disease, stage 4 (severe): Secondary | ICD-10-CM | POA: Diagnosis present

## 2013-07-28 DIAGNOSIS — I498 Other specified cardiac arrhythmias: Secondary | ICD-10-CM | POA: Diagnosis present

## 2013-07-28 DIAGNOSIS — Z951 Presence of aortocoronary bypass graft: Secondary | ICD-10-CM

## 2013-07-28 DIAGNOSIS — I359 Nonrheumatic aortic valve disorder, unspecified: Secondary | ICD-10-CM | POA: Diagnosis present

## 2013-07-28 DIAGNOSIS — I739 Peripheral vascular disease, unspecified: Secondary | ICD-10-CM | POA: Diagnosis present

## 2013-07-28 DIAGNOSIS — I2510 Atherosclerotic heart disease of native coronary artery without angina pectoris: Secondary | ICD-10-CM

## 2013-07-28 DIAGNOSIS — I4510 Unspecified right bundle-branch block: Secondary | ICD-10-CM | POA: Diagnosis present

## 2013-07-28 DIAGNOSIS — R7989 Other specified abnormal findings of blood chemistry: Secondary | ICD-10-CM

## 2013-07-28 DIAGNOSIS — R778 Other specified abnormalities of plasma proteins: Secondary | ICD-10-CM

## 2013-07-28 DIAGNOSIS — I5031 Acute diastolic (congestive) heart failure: Secondary | ICD-10-CM

## 2013-07-28 DIAGNOSIS — I5032 Chronic diastolic (congestive) heart failure: Secondary | ICD-10-CM | POA: Diagnosis present

## 2013-07-28 DIAGNOSIS — N039 Chronic nephritic syndrome with unspecified morphologic changes: Principal | ICD-10-CM

## 2013-07-28 DIAGNOSIS — I2489 Other forms of acute ischemic heart disease: Secondary | ICD-10-CM | POA: Diagnosis present

## 2013-07-28 DIAGNOSIS — I252 Old myocardial infarction: Secondary | ICD-10-CM

## 2013-07-28 DIAGNOSIS — E1169 Type 2 diabetes mellitus with other specified complication: Secondary | ICD-10-CM | POA: Diagnosis present

## 2013-07-28 DIAGNOSIS — I5033 Acute on chronic diastolic (congestive) heart failure: Secondary | ICD-10-CM | POA: Diagnosis present

## 2013-07-28 DIAGNOSIS — E785 Hyperlipidemia, unspecified: Secondary | ICD-10-CM | POA: Diagnosis present

## 2013-07-28 DIAGNOSIS — I130 Hypertensive heart and chronic kidney disease with heart failure and stage 1 through stage 4 chronic kidney disease, or unspecified chronic kidney disease: Principal | ICD-10-CM | POA: Diagnosis present

## 2013-07-28 HISTORY — DX: Unspecified ovarian cyst, unspecified side: N83.209

## 2013-07-28 HISTORY — DX: Anxiety disorder, unspecified: F41.9

## 2013-07-28 HISTORY — DX: Old myocardial infarction: I25.2

## 2013-07-28 HISTORY — DX: Chronic kidney disease, stage 4 (severe): N18.4

## 2013-07-28 HISTORY — DX: Type 2 diabetes mellitus with other circulatory complications: E11.59

## 2013-07-28 HISTORY — DX: Unspecified cataract: H26.9

## 2013-07-28 HISTORY — DX: Hypertensive heart disease without heart failure: I11.9

## 2013-07-28 HISTORY — DX: Depression, unspecified: F32.A

## 2013-07-28 HISTORY — DX: Major depressive disorder, single episode, unspecified: F32.9

## 2013-07-28 LAB — POCT I-STAT 3, ART BLOOD GAS (G3+)
ACID-BASE DEFICIT: 7 mmol/L — AB (ref 0.0–2.0)
BICARBONATE: 22.1 meq/L (ref 20.0–24.0)
O2 Saturation: 95 %
TCO2: 24 mmol/L (ref 0–100)
pCO2 arterial: 57.6 mmHg (ref 35.0–45.0)
pH, Arterial: 7.193 — CL (ref 7.350–7.450)
pO2, Arterial: 93 mmHg (ref 80.0–100.0)

## 2013-07-28 LAB — COMPREHENSIVE METABOLIC PANEL
ALBUMIN: 2.7 g/dL — AB (ref 3.5–5.2)
ALK PHOS: 61 U/L (ref 39–117)
ALT: 19 U/L (ref 0–35)
ALT: 17 U/L (ref 0–35)
AST: 38 U/L — ABNORMAL HIGH (ref 0–37)
AST: 31 U/L (ref 0–37)
Albumin: 3 g/dL — ABNORMAL LOW (ref 3.5–5.2)
Alkaline Phosphatase: 51 U/L (ref 39–117)
BUN: 29 mg/dL — ABNORMAL HIGH (ref 6–23)
BUN: 30 mg/dL — ABNORMAL HIGH (ref 6–23)
CHLORIDE: 106 meq/L (ref 96–112)
CO2: 21 meq/L (ref 19–32)
CO2: 22 mEq/L (ref 19–32)
Calcium: 8.9 mg/dL (ref 8.4–10.5)
Calcium: 8.8 mg/dL (ref 8.4–10.5)
Chloride: 107 mEq/L (ref 96–112)
Creatinine, Ser: 2.59 mg/dL — ABNORMAL HIGH (ref 0.50–1.10)
Creatinine, Ser: 2.65 mg/dL — ABNORMAL HIGH (ref 0.50–1.10)
GFR calc Af Amer: 19 mL/min — ABNORMAL LOW (ref >90)
GFR calc Af Amer: 19 mL/min — ABNORMAL LOW (ref >90)
GFR calc non Af Amer: 16 mL/min — ABNORMAL LOW (ref >90)
GFR, EST NON AFRICAN AMERICAN: 17 mL/min — AB (ref >90)
GLUCOSE: 180 mg/dL — AB (ref 70–99)
Glucose, Bld: 121 mg/dL — ABNORMAL HIGH (ref 70–99)
POTASSIUM: 5.6 meq/L — AB (ref 3.7–5.3)
POTASSIUM: 5 meq/L (ref 3.7–5.3)
SODIUM: 139 meq/L (ref 137–147)
SODIUM: 139 meq/L (ref 137–147)
TOTAL PROTEIN: 6.8 g/dL (ref 6.0–8.3)
TOTAL PROTEIN: 6.3 g/dL (ref 6.0–8.3)
Total Bilirubin: 0.3 mg/dL (ref 0.3–1.2)
Total Bilirubin: 0.2 mg/dL — ABNORMAL LOW (ref 0.3–1.2)

## 2013-07-28 LAB — CBC WITH DIFFERENTIAL/PLATELET
Basophils Absolute: 0.1 10*3/uL (ref 0.0–0.1)
Basophils Relative: 1 % (ref 0–1)
Eosinophils Absolute: 0.3 10*3/uL (ref 0.0–0.7)
Eosinophils Relative: 3 % (ref 0–5)
HCT: 42.3 % (ref 36.0–46.0)
Hemoglobin: 13.4 g/dL (ref 12.0–15.0)
LYMPHS ABS: 4.4 10*3/uL — AB (ref 0.7–4.0)
Lymphocytes Relative: 34 % (ref 12–46)
MCH: 30.7 pg (ref 26.0–34.0)
MCHC: 31.7 g/dL (ref 30.0–36.0)
MCV: 96.8 fL (ref 78.0–100.0)
Monocytes Absolute: 0.7 10*3/uL (ref 0.1–1.0)
Monocytes Relative: 5 % (ref 3–12)
NEUTROS ABS: 7.4 10*3/uL (ref 1.7–7.7)
NEUTROS PCT: 57 % (ref 43–77)
Platelets: 239 10*3/uL (ref 150–400)
RBC: 4.37 MIL/uL (ref 3.87–5.11)
RDW: 14.6 % (ref 11.5–15.5)
WBC: 12.9 10*3/uL — AB (ref 4.0–10.5)

## 2013-07-28 LAB — POCT I-STAT TROPONIN I: Troponin i, poc: 0.11 ng/mL (ref 0.00–0.08)

## 2013-07-28 LAB — CBC
HCT: 37.1 % (ref 36.0–46.0)
Hemoglobin: 11.8 g/dL — ABNORMAL LOW (ref 12.0–15.0)
MCH: 30.5 pg (ref 26.0–34.0)
MCHC: 31.8 g/dL (ref 30.0–36.0)
MCV: 95.9 fL (ref 78.0–100.0)
Platelets: 195 10*3/uL (ref 150–400)
RBC: 3.87 MIL/uL (ref 3.87–5.11)
RDW: 14.4 % (ref 11.5–15.5)
WBC: 9 10*3/uL (ref 4.0–10.5)

## 2013-07-28 LAB — CG4 I-STAT (LACTIC ACID): Lactic Acid, Venous: 1.49 mmol/L (ref 0.5–2.2)

## 2013-07-28 LAB — GLUCOSE, CAPILLARY: Glucose-Capillary: 109 mg/dL — ABNORMAL HIGH (ref 70–99)

## 2013-07-28 LAB — MRSA PCR SCREENING: MRSA by PCR: NEGATIVE

## 2013-07-28 LAB — PRO B NATRIURETIC PEPTIDE: Pro B Natriuretic peptide (BNP): 32203 pg/mL — ABNORMAL HIGH (ref 0–450)

## 2013-07-28 MED ORDER — ALPRAZOLAM 0.25 MG PO TABS
0.2500 mg | ORAL_TABLET | Freq: Two times a day (BID) | ORAL | Status: DC | PRN
  Administered 2013-07-29 – 2013-08-03 (×11): 0.25 mg via ORAL
  Filled 2013-07-28 (×11): qty 1

## 2013-07-28 MED ORDER — SODIUM CHLORIDE 0.9 % IJ SOLN
3.0000 mL | Freq: Two times a day (BID) | INTRAMUSCULAR | Status: DC
  Administered 2013-07-28: 22:00:00 via INTRAVENOUS
  Administered 2013-07-29: 3 mL via INTRAVENOUS
  Administered 2013-07-30: 13:00:00 via INTRAVENOUS
  Administered 2013-07-30: 3 mL via INTRAVENOUS
  Administered 2013-07-31: 12:00:00 via INTRAVENOUS
  Administered 2013-07-31 – 2013-08-01 (×2): 3 mL via INTRAVENOUS

## 2013-07-28 MED ORDER — ATORVASTATIN CALCIUM 80 MG PO TABS
80.0000 mg | ORAL_TABLET | Freq: Every day | ORAL | Status: DC
  Administered 2013-07-28 – 2013-08-02 (×6): 80 mg via ORAL
  Filled 2013-07-28 (×7): qty 1

## 2013-07-28 MED ORDER — ALBUTEROL SULFATE (2.5 MG/3ML) 0.083% IN NEBU
5.0000 mg | INHALATION_SOLUTION | Freq: Once | RESPIRATORY_TRACT | Status: AC
  Administered 2013-07-28: 5 mg via RESPIRATORY_TRACT
  Filled 2013-07-28: qty 6

## 2013-07-28 MED ORDER — CALCITRIOL 0.25 MCG PO CAPS
0.2500 ug | ORAL_CAPSULE | ORAL | Status: DC
  Administered 2013-07-29 – 2013-08-03 (×3): 0.25 ug via ORAL
  Filled 2013-07-28 (×3): qty 1

## 2013-07-28 MED ORDER — ENOXAPARIN SODIUM 30 MG/0.3ML ~~LOC~~ SOLN
30.0000 mg | SUBCUTANEOUS | Status: DC
  Administered 2013-07-28 – 2013-08-02 (×6): 30 mg via SUBCUTANEOUS
  Filled 2013-07-28 (×7): qty 0.3

## 2013-07-28 MED ORDER — INSULIN ASPART 100 UNIT/ML ~~LOC~~ SOLN
0.0000 [IU] | Freq: Three times a day (TID) | SUBCUTANEOUS | Status: DC
  Administered 2013-07-30 – 2013-07-31 (×3): 2 [IU] via SUBCUTANEOUS

## 2013-07-28 MED ORDER — ASPIRIN EC 81 MG PO TBEC
81.0000 mg | DELAYED_RELEASE_TABLET | Freq: Every day | ORAL | Status: DC
  Administered 2013-07-29 – 2013-08-03 (×6): 81 mg via ORAL
  Filled 2013-07-28 (×7): qty 1

## 2013-07-28 MED ORDER — HYDRALAZINE HCL 50 MG PO TABS
50.0000 mg | ORAL_TABLET | Freq: Once | ORAL | Status: AC
  Administered 2013-07-28: 50 mg via ORAL
  Filled 2013-07-28: qty 1

## 2013-07-28 MED ORDER — NITROGLYCERIN IN D5W 200-5 MCG/ML-% IV SOLN
10.0000 ug/min | INTRAVENOUS | Status: AC
  Administered 2013-07-28: 10 ug/min via INTRAVENOUS

## 2013-07-28 MED ORDER — FUROSEMIDE 20 MG PO TABS: 40.0000 mg | ORAL_TABLET | Freq: Two times a day (BID) | ORAL | Status: DC

## 2013-07-28 MED ORDER — CARVEDILOL 25 MG PO TABS
25.0000 mg | ORAL_TABLET | Freq: Once | ORAL | Status: AC
  Administered 2013-07-28: 25 mg via ORAL
  Filled 2013-07-28: qty 1

## 2013-07-28 MED ORDER — ACETAMINOPHEN 325 MG PO TABS: 650.0000 mg | ORAL_TABLET | ORAL | Status: DC | PRN

## 2013-07-28 MED ORDER — FUROSEMIDE 10 MG/ML IJ SOLN
40.0000 mg | Freq: Once | INTRAMUSCULAR | Status: AC
  Administered 2013-07-28: 40 mg via INTRAVENOUS
  Filled 2013-07-28: qty 4

## 2013-07-28 MED ORDER — SODIUM CHLORIDE 0.9 % IJ SOLN: 3.0000 mL | INTRAMUSCULAR | Status: DC | PRN

## 2013-07-28 MED ORDER — HYDROCODONE-ACETAMINOPHEN 7.5-325 MG PO TABS: 1.0000 | ORAL_TABLET | Freq: Four times a day (QID) | ORAL | Status: DC | PRN

## 2013-07-28 MED ORDER — ASPIRIN 81 MG PO CHEW
324.0000 mg | CHEWABLE_TABLET | Freq: Once | ORAL | Status: AC
  Administered 2013-07-28: 324 mg via ORAL
  Filled 2013-07-28: qty 4

## 2013-07-28 MED ORDER — IPRATROPIUM BROMIDE 0.02 % IN SOLN
0.5000 mg | Freq: Once | RESPIRATORY_TRACT | Status: AC
  Administered 2013-07-28: 0.5 mg via RESPIRATORY_TRACT
  Filled 2013-07-28: qty 2.5

## 2013-07-28 MED ORDER — CARVEDILOL 25 MG PO TABS
25.0000 mg | ORAL_TABLET | Freq: Three times a day (TID) | ORAL | Status: DC
  Administered 2013-07-29 (×2): 25 mg via ORAL
  Filled 2013-07-28 (×5): qty 1

## 2013-07-28 MED ORDER — NITROGLYCERIN IN D5W 200-5 MCG/ML-% IV SOLN
INTRAVENOUS | Status: AC
  Filled 2013-07-28: qty 250

## 2013-07-28 MED ORDER — SODIUM CHLORIDE 0.9 % IV SOLN
250.0000 mL | INTRAVENOUS | Status: DC | PRN
  Administered 2013-07-28: 250 mL via INTRAVENOUS

## 2013-07-28 MED ORDER — SODIUM CHLORIDE 0.9 % IV SOLN
Freq: Once | INTRAVENOUS | Status: AC
  Administered 2013-07-28: 17:00:00 via INTRAVENOUS

## 2013-07-28 MED ORDER — HYDRALAZINE HCL 50 MG PO TABS
50.0000 mg | ORAL_TABLET | Freq: Three times a day (TID) | ORAL | Status: DC
  Administered 2013-07-29 – 2013-08-02 (×13): 50 mg via ORAL
  Filled 2013-07-28 (×16): qty 1

## 2013-07-28 MED ORDER — HYDROCODONE-ACETAMINOPHEN 7.5-325 MG PO TABS
1.0000 | ORAL_TABLET | Freq: Four times a day (QID) | ORAL | Status: DC | PRN
  Administered 2013-07-28 – 2013-07-29 (×4): 1 via ORAL
  Filled 2013-07-28 (×5): qty 1

## 2013-07-28 MED ORDER — FUROSEMIDE 10 MG/ML IJ SOLN
80.0000 mg | Freq: Two times a day (BID) | INTRAMUSCULAR | Status: DC
  Administered 2013-07-28 – 2013-07-29 (×3): 80 mg via INTRAVENOUS
  Filled 2013-07-28 (×5): qty 8

## 2013-07-28 NOTE — H&P (Signed)
History and Physical   Admit date: 07/28/2013 Name:  Tricia [name] Medical record number: [id number] DOB/Age:  1933-09-05  80 y.o. female  Referring Physician:   [facility]  Primary Cardiologist:  Dr. Peter Swaziland  Primary Physician:   Dr. Windle Guard  Chief complaint/reason for admission: Shortness of breath  HPI:  This 80 year old female has a previous history of an inferior infarction, diabetes, severe peripheral vascular disease, hypertensive heart disease, prior diastolic heart failure, and coronary bypass grafting. She has had a previous HEENT dictation due to peripheral vascular disease and has a left iliac stent. She normally gets around home in a wheelchair and continues to smoke heavily. She has complained of fatigue recently and had run out of [place]  that she took to control urinary frequency. She started taking something else as a substitute but cannot remember the name of it but has not taken anything the past couple of days. She is complained of feeling somewhat lethargic and fatigued and started taking the vitamins. She was in her usual state of health until the day of admission when she became abruptly short of breath and was brought to the emergency room in pulmonary edema. She was treated with intravenous nitroglycerin, BiPAP and intravenous Lasix with improvement in her symptoms. She had a marked elevation of her BNP and an EKG that was nonacute. She is currently feeling much better with less shortness of breath. She was markedly hypertensive on admission. She had a similar admission in 2012. She has known chronic kidney disease and is also followed by nephrology. She has not used any nonsteroidal anti-inflammatory agents.  She normally does not have clinical angina and denies PND or orthopnea. She has not had any recent dietary changes and only real change in medicine was related to the urologic agents.   Past Medical History  Diagnosis Date  .  Hypertension   . Hyperlipidemia   . Coronary artery disease     post CABG in 1999, with 2-vessel disease -- - LIMA graft to the LAD and a left radial artery graft to the PDA   . Old inferior wall myocardial infarction 1999  . 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 Tobacco dependency   . CKD (chronic kidney disease) stage 4, GFR 15-29 ml/min     followed by Dr. Arrie Aran  . Diastolic heart failure   . Aortic stenosis     Mild  . Type 2 diabetes mellitus with vascular disease   . CKD (chronic kidney disease), stage IV   . Hypertensive heart disease       Past Surgical History  Procedure Laterality Date  . Partial hysterectomy    . Total vaginal hysterectomy    . 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 Cardiac catheterization    . Coronary artery bypass graft  01/26/1998    Est. EF of 40-45% -- with 2-vessel disease -- LIMA graft to the LAD and a left radial artery graft to the PDA   . Iliac vein angioplasty / stenting  08/10/2010     Left common iliac stenting   Allergies: Amlodipine causes edema   Medications: Prior to Admission medications   Medication Sig Start Date End Date Taking? Authorizing Provider  calcitRIOL (ROCALTROL) 0.25 MCG capsule Take by mouth. M,W,F 03/13/12   Historical Provider, MD  carvedilol (COREG) 25 MG tablet Take 25 mg by mouth. Taking 1.5Tablets BID    Historical  Provider, MD  ergocalciferol (VITAMIN D2) 50000 UNITS capsule Take 50,000 Units by mouth every 21 ( twenty-one) days.     Historical Provider, MD  furosemide (LASIX) 40 MG tablet Take 1 tablet (40 mg total) by mouth 2 (two) times daily. 10/22/12   Peter M Swazil[place], MD  hydrALAZINE (APRESOLINE) 50 MG tablet Take 1 tablet (50 mg total) by mouth 3 (three) times daily. 10/01/12   Peter M [place], MD  HYDROcodone-acetaminophen (NORCO) 7.5-325 MG per tablet Take 1 tablet by mouth every 6 (six) hours as needed. For pain    Historical Provider, MD  multivitamin [facility]) per tablet Take  1 tablet by mouth daily.      Historical Provider, MD  rosuvastatin (CRESTOR) 40 MG tablet Take 40 mg by mouth daily.     Historical Provider, MD  Trospium Chloride 60 MG CP24 daily.  09/26/12   Historical Provider, MD   Family History:  Family Status  Relation Status Death Age  . Father Deceased 35    of MI  . Mother Deceased   . Child Deceased 73    brain ca    Social History:   reports that she has been smoking Cigarettes.  She has been smoking about 0.50 packs per day. She has never used smokeless tobacco. She reports that she does not drink alcohol or use illicit drugs.   History   Social History Narrative   Lives with son              Review of Systems:  She complained of some left-sided sharp chest pain. She has been somewhat lethargic as noted above. She has mild dyspnea with exertion. She has not had any severe constipation but does have urinary frequency and sees the urologist. Other than as noted above, the remainder of the review of systems is normal  Physical Exam: BP 181/55  Pulse 59  Temp(Src) 97.7 F (36.5 C) (Oral)  Resp 20  Ht 5\' 8"  (1.727 m)  Wt 79.379 kg (175 lb)  BMI 26.61 kg/m2  SpO2 95%  General appearance: Elderly appearing white female who is kyphotic and currently in no respiratory distress Head: Normocephalic, without obvious abnormality, atraumatic Eyes: conjunctivae/corneas clear. PERRL, EOM's intact. Fundi not examined  Neck: no adenopathy, no carotid bruit, no JVD and supple, symmetrical, trachea midline Lungs: Diminished breath sounds and mild crackles at bases, increased AP diameter, healed median sternotomy scar Heart: Bradycardia, normal S1-S2, no S3, systolic murmur heard across the outflow tract Abdomen: soft, non-tender; bowel sounds normal; no masses,  no organomegaly Pelvic: deferred Extremities: Right BK amputation, 1+ edema in the left leg, peripheral pulses are diminished on that leg, healed radial harvesting scar on left  arm Pulses: Femoral pulses are palpable with bruits, distal pulses on the left are diminished Neurologic: Grossly normal Skin: Ecchymoses present on both hands  Labs: CBC  Recent Labs  07/28/13 1651  WBC 12.9*  RBC 4.37  HGB 13.4  HCT 42.3  PLT 239  MCV 96.8  MCH 30.7  MCHC 31.7  RDW 14.6  LYMPHSABS 4.4*  MONOABS 0.7  EOSABS 0.3  BASOSABS 0.1   CMP   Recent Labs  07/28/13 1651  NA 139  K 5.6*  CL 107  CO2 21  GLUCOSE 180*  BUN 29*  CREATININE 2.59*  CALCIUM 8.9  PROT 6.8  ALBUMIN 3.0*  AST 38*  ALT 19  ALKPHOS 61  BILITOT 0.3  GFRNONAA 17*  GFRAA 19*   BNP (last 3 results)  Recent Labs  07/28/13 1650  PROBNP 32203.0*   Cardiac Panel (last 3 results) Troponin (Point of Care Test)  Recent Labs  07/28/13 1728  TROPIPOC 0.11*   Thyroid  Lab Results  Component Value Date   TSH 2.111 04/26/2011    EKG: Sinus bradycardia with right bundle branch block, nonspecific ST changes  Radiology: Cardiomegaly with interstitial edema   IMPRESSIONS: 1. Acute pulmonary edema-the acute nature could possibly indicate renal artery stenosis 2. Hypertensive urgency 3. Coronary artery disease with previous bypass grafting 4. Previous inferior infarction 5. Hypertensive heart disease with congestive heart failure 6. Type 2 diabetes mellitus with vascular disease 7. Previous right amputation 8. Hyperlipidemia 9. Tobacco abuse with ongoing cigarette smoking 10. Severe peripheral vascular disease 11. Type II non-STEMI likely due to severe hypertension and demand ischemia  PLAN: She will have serial cardiac enzymes and be admitted to the step down unit. Initiate intravenous diuresis. She likely will need to have a nephrology consultation during this admission. Repeat echocardiogram. Continue intravenous nitroglycerin. Again discussed importance of tobacco cessation. Place on sliding scale insulin. He did follow up the recent urologic drug as this also  historically was associated with her events in 2012.  Signed: Darden PalmerW. Spencer Tilley, Jr. MD [facility]  07/28/2013, 7:27 PM

## 2013-07-28 NOTE — ED Notes (Signed)
EMS called out for chest pain that resolved by time ems arrived, then patient reported sob and was on 96%/2L.  Pt then sats dropped to 91% and placed on NRB.  Pt has history of chf/ OHS.  Pt has bp 234/94.  Pt sitting upright with orbital edema, right aka.

## 2013-07-28 NOTE — ED Notes (Signed)
NOTIFIED PHYSICIAN IN PERSON OF PATIENTS LAB RESULTS OF CG4 LACTIC ACID ,AND I-STAT TROPONIN@ 17;40 PM ,01 /13/ 2015.

## 2013-07-28 NOTE — ED Provider Notes (Addendum)
Patient's evaluated.  I discussed the case at length with Dr. Roseanne KaufmanBringle.  The patient is an apparent heart failure clinically and radiographically.  Thought secondary to hypertensive urgency.  Her pressures and course have improved on IV nitroglycerin.  Full catheters placed.  Given IV Lasix.  Cardiology consult orders in route.  EKG shows no ST elevations or acute changes.  Troponin minimally elevated at 0.11.  Hemodynamically much more stable.  For a short course on BiPAP she has been weaned to nasal cannula.  Rolland PorterMark Jerremy Maione, MD 07/28/13 1905  I saw and evaluated the patient, reviewed the resident's note and I agree with the findings and plan.  EKG Interpretation    Date/Time:  Tuesday July 28 2013 16:49:27 EST Ventricular Rate:  86 PR Interval:  216 QRS Duration: 136 QT Interval:  374 QTC Calculation: 447 R Axis:   30 Text Interpretation:  Sinus rhythm Borderline prolonged PR interval Right bundle branch block Inferior infarct, age indeterminate Probable posterior infarct, acute ED PHYSICIAN INTERPRETATION AVAILABLE IN [facility] Confirmed by TEST, RECORD ([id number]) on 07/30/2013 11:57:56 AM              Rolland PorterMark Mkayla Steele, MD 08/01/13 [phone]

## 2013-07-28 NOTE — ED Notes (Signed)
Pt c/o sudden onset SOB 3 hrs ago. Denies CP, fever, chills, cough, n/v/d. Reports has been taking all her BP meds as prescribed including today. ED MD at bedside, aware of elevated BP.  Pt very anxious, requesting med for her anxiety & nerves.

## 2013-07-28 NOTE — ED Provider Notes (Signed)
CSN: [id number]     Arrival date & time 07/28/13  1640 History   First MD Initiated Contact with Patient 07/28/13 1654     Chief Complaint  Patient presents with  . Shortness of Breath  . Chest Pain   HPI  80 y/o female with history as noted below who presents with cc of shortness of breath. She states she has been feeling fatigued during the past few days but denies fevers or coughs. Approximately 3 hours prior to arrival she developed acute onset shortness of breath. EMS reports they were called for chest pain but the patient denies this. She denies ever having any chest pain. She states she has been taking her medications as prescribed. She has had mild increased swelling in her left leg.   Past Medical History  Diagnosis Date  . Hypertension   . Hyperlipidemia   . Coronary artery disease     post CABG in 1999, with 2-vessel disease -- - LIMA graft to the LAD and a left radial artery graft to the PDA   . Old inferior wall myocardial infarction 1999  . 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 Tobacco dependency   . CKD (chronic kidney disease) stage 4, GFR 15-29 ml/min     followed by Dr. Arrie Aranoladonato  . Diastolic heart failure   . Aortic stenosis     Mild  . Type 2 diabetes mellitus with vascular disease   . CKD (chronic kidney disease), stage IV   . Hypertensive heart disease   . Anxiety   . Depression   . Cataract   . Ovarian cyst    Past Surgical History  Procedure Laterality Date  . Partial hysterectomy    . Total vaginal hysterectomy    . 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 Cardiac catheterization    . Iliac vein angioplasty / stenting  08/10/2010     Left common iliac stenting  . Coronary artery bypass graft  01/26/1998    Est. EF of 40-45% -- with 2-vessel disease -- LIMA graft to the LAD and a left radial artery graft to the PDA    Family History  Problem Relation Age of Onset  . Heart attack Father 437   History  Substance Use Topics  .  Smoking status: Current Every Day Smoker -- 0.50 packs/day for 60 years    Types: Cigarettes  . Smokeless tobacco: Never Used  . Alcohol Use: No   OB History   Grav Para Term Preterm Abortions TAB SAB Ect Mult Living                 Review of Systems  Allergies  Review of patient's allergies indicates no known allergies.  Home Medications   No current outpatient prescriptions on file. BP 180/54  Pulse 59  Temp(Src) 97.7 F (36.5 C) (Oral)  Resp 20  Ht 5\' 8"  (1.727 m)  Wt 173 lb 1 oz (78.5 kg)  BMI 26.32 kg/m2  SpO2 95% Physical Exam  Nursing note and vitals reviewed. Constitutional: She is oriented to person, place, and time. She appears well-developed and well-nourished. No distress.  HENT:  Head: Normocephalic and atraumatic.  Eyes: Conjunctivae are normal. Pupils are equal, round, and reactive to light.  Neck: Normal range of motion. Neck supple.  Cardiovascular: Normal rate and regular rhythm.  Exam reveals no gallop and no friction rub.   No murmur heard. Pulmonary/Chest: Tachypnea noted. She  has decreased breath sounds in the right lower field and the left lower field. She has rales in the right middle field and the left middle field.  Abdominal: Soft. She exhibits no distension. There is no tenderness.  Musculoskeletal: Normal range of motion. She exhibits edema (+2 pitting edema in LLE). She exhibits no tenderness.  Right AKA  Neurological: She is alert and oriented to person, place, and time. She has normal strength and normal reflexes. No cranial nerve deficit or sensory deficit.  Skin: Skin is warm and dry.  Psychiatric: She has a normal mood and affect.    ED Course  CRITICAL CARE Performed by: Shanon Ace Authorized by: Shanon Ace Total critical care time: 30 minutes Critical care time was exclusive of separately billable procedures and treating other patients. Critical care was necessary to treat or prevent imminent or life-threatening  deterioration of the following conditions: respiratory failure. Critical care was time spent personally by me on the following activities: discussions with consultants, evaluation of patient's response to treatment, examination of patient, ordering and performing treatments and interventions, ordering and review of laboratory studies, ordering and review of radiographic studies, pulse oximetry and re-evaluation of patient's condition. Subsequent provider of critical care: I assumed direction of critical care for this patient from another provider of my specialty.   (including critical care time) Labs Review Labs Reviewed  PRO B NATRIURETIC PEPTIDE - Abnormal; Notable for the following:    Pro B Natriuretic peptide (BNP) 32203.0 (*)    All other components within normal limits  CBC WITH DIFFERENTIAL - Abnormal; Notable for the following:    WBC 12.9 (*)    Lymphs Abs 4.4 (*)    All other components within normal limits  COMPREHENSIVE METABOLIC PANEL - Abnormal; Notable for the following:    Potassium 5.6 (*)    Glucose, Bld 180 (*)    BUN 29 (*)    Creatinine, Ser 2.59 (*)    Albumin 3.0 (*)    AST 38 (*)    GFR calc non Af Amer 17 (*)    GFR calc Af Amer 19 (*)    All other components within normal limits  COMPREHENSIVE METABOLIC PANEL - Abnormal; Notable for the following:    Glucose, Bld 121 (*)    BUN 30 (*)    Creatinine, Ser 2.65 (*)    Albumin 2.7 (*)    Total Bilirubin 0.2 (*)    GFR calc non Af Amer 16 (*)    GFR calc Af Amer 19 (*)    All other components within normal limits  CBC - Abnormal; Notable for the following:    Hemoglobin 11.8 (*)    All other components within normal limits  GLUCOSE, CAPILLARY - Abnormal; Notable for the following:    Glucose-Capillary 109 (*)    All other components within normal limits  POCT I-STAT 3, BLOOD GAS (G3+) - Abnormal; Notable for the following:    pH, Arterial 7.193 (*)    pCO2 arterial 57.6 (*)    Acid-base deficit 7.0 (*)     All other components within normal limits  POCT I-STAT TROPONIN I - Abnormal; Notable for the following:    Troponin i, poc 0.11 (*)    All other components within normal limits  MRSA PCR SCREENING  TSH  BASIC METABOLIC PANEL  HEMOGLOBIN A1C  CG4 I-STAT (LACTIC ACID)   Imaging Review Dg Chest Port 1 View  07/28/2013   CLINICAL DATA:  Shortness of breath.  EXAM: PORTABLE CHEST - 1 VIEW  COMPARISON:  11/02/2011.  FINDINGS: Prior CABG. Cardiomegaly with pulmonary vascular prominence and interstitial prominence consistent with congestive heart failure with interstitial pulmonary edema. Interstitial pulmonary edema this severe. Small bilateral pleural effusions. No pneumothorax. No acute osseous abnormality.  IMPRESSION: 1. Congestive heart failure with severe bilateral pulmonary interstitial edema. Small bilateral pleural effusions. Superimposed pneumonitis cannot be excluded .  2. Prior CABG.   Electronically Signed   By: Maisie Fus  Register   On: 07/28/2013 17:06    EKG Interpretation    Date/Time:    Ventricular Rate:    PR Interval:    QRS Duration:   QT Interval:    QTC Calculation:   R Axis:     Text Interpretation:              MDM   1. Hypertensive emergency   2. Pulmonary edema   3. Chest pain   4. Elevated troponin I level     Here with acute onset of shortness of breath. Moderate respiratory distress on arrival. Hypertensive initially to 256/82. Lungs with rales. CXR c/w pulmonary edema. Denies chest pain. EKG with mild ST depression in lead V2 but no reciprocal changes. Initial troponin mildly elevated. ASA given. The patient was placed on BiPAP and a nitro gtt was started. She had significant improvement in her respiratory status when her blood pressure improved. She was given albuterol nebulizer as well. She was transitioned to nasal canula when her work of breathing improved. 40 mg of lasix given as well. BNP, CXR and presentation are all c/w acute diastolic heart  failure from hypertensive emergency. She was admitted to cardiology for continued workup and management.    Shanon Ace, MD 07/28/13 [phone]

## 2013-07-28 NOTE — ED Notes (Addendum)
Trial off of Bipap. Placed on [place] 3L. POX 95%. Resp e/u, no distress. Will monitor. Pt denies CP, SOB, states breathing much better now.

## 2013-07-28 NOTE — Progress Notes (Signed)
Pt taken off Bipap for RN to give chewable pills and water. PT doing much better, RN asked to leave off Bipap at this time, RT will continue to monitor. Pt on 3L [place] Sats 96%

## 2013-07-28 NOTE — ED Notes (Signed)
Assisted pt with bedside toilet and changed breif

## 2013-07-29 DIAGNOSIS — J811 Chronic pulmonary edema: Secondary | ICD-10-CM

## 2013-07-29 DIAGNOSIS — I10 Essential (primary) hypertension: Secondary | ICD-10-CM

## 2013-07-29 DIAGNOSIS — I359 Nonrheumatic aortic valve disorder, unspecified: Secondary | ICD-10-CM

## 2013-07-29 DIAGNOSIS — N184 Chronic kidney disease, stage 4 (severe): Secondary | ICD-10-CM

## 2013-07-29 DIAGNOSIS — I739 Peripheral vascular disease, unspecified: Secondary | ICD-10-CM

## 2013-07-29 LAB — URINALYSIS, ROUTINE W REFLEX MICROSCOPIC
Bilirubin Urine: NEGATIVE
Glucose, UA: NEGATIVE mg/dL
Hgb urine dipstick: NEGATIVE
Ketones, ur: NEGATIVE mg/dL
LEUKOCYTES UA: NEGATIVE
Nitrite: NEGATIVE
PROTEIN: 100 mg/dL — AB
Specific Gravity, Urine: 1.008 (ref 1.005–1.030)
Urobilinogen, UA: 0.2 mg/dL (ref 0.0–1.0)
pH: 5 (ref 5.0–8.0)

## 2013-07-29 LAB — BASIC METABOLIC PANEL
BUN: 32 mg/dL — ABNORMAL HIGH (ref 6–23)
BUN: 33 mg/dL — ABNORMAL HIGH (ref 6–23)
CO2: 22 meq/L (ref 19–32)
CO2: 20 mEq/L (ref 19–32)
CREATININE: 2.78 mg/dL — AB (ref 0.50–1.10)
Calcium: 8.3 mg/dL — ABNORMAL LOW (ref 8.4–10.5)
Calcium: 8.7 mg/dL (ref 8.4–10.5)
Chloride: 108 mEq/L (ref 96–112)
Chloride: 107 mEq/L (ref 96–112)
Creatinine, Ser: 2.86 mg/dL — ABNORMAL HIGH (ref 0.50–1.10)
GFR calc Af Amer: 18 mL/min — ABNORMAL LOW (ref >90)
GFR calc non Af Amer: 15 mL/min — ABNORMAL LOW (ref >90)
GFR calc non Af Amer: 15 mL/min — ABNORMAL LOW (ref >90)
GFR, EST AFRICAN AMERICAN: 17 mL/min — AB (ref >90)
GLUCOSE: 128 mg/dL — AB (ref 70–99)
GLUCOSE: 138 mg/dL — AB (ref 70–99)
POTASSIUM: 5 meq/L (ref 3.7–5.3)
Potassium: 4.7 mEq/L (ref 3.7–5.3)
SODIUM: 138 meq/L (ref 137–147)
Sodium: 140 mEq/L (ref 137–147)

## 2013-07-29 LAB — URINE MICROSCOPIC-ADD ON

## 2013-07-29 LAB — HEMOGLOBIN A1C
Hgb A1c MFr Bld: 5.6 % (ref <5.7)
Mean Plasma Glucose: 114 mg/dL (ref <117)

## 2013-07-29 LAB — GLUCOSE, CAPILLARY
Glucose-Capillary: 89 mg/dL (ref 70–99)
Glucose-Capillary: 116 mg/dL — ABNORMAL HIGH (ref 70–99)
Glucose-Capillary: 163 mg/dL — ABNORMAL HIGH (ref 70–99)
Glucose-Capillary: 120 mg/dL — ABNORMAL HIGH (ref 70–99)

## 2013-07-29 LAB — TSH: TSH: 2.212 u[IU]/mL (ref 0.350–4.500)

## 2013-07-29 MED ORDER — LABETALOL HCL 200 MG PO TABS
200.0000 mg | ORAL_TABLET | Freq: Two times a day (BID) | ORAL | Status: DC
  Administered 2013-07-30 – 2013-07-31 (×3): 200 mg via ORAL
  Filled 2013-07-29 (×11): qty 1

## 2013-07-29 MED ORDER — HYDRALAZINE HCL 20 MG/ML IJ SOLN
10.0000 mg | Freq: Once | INTRAMUSCULAR | Status: AC
  Administered 2013-07-29: 10 mg via INTRAVENOUS
  Filled 2013-07-29: qty 1

## 2013-07-29 MED ORDER — ISOSORBIDE DINITRATE 10 MG PO TABS
10.0000 mg | ORAL_TABLET | Freq: Three times a day (TID) | ORAL | Status: DC
  Administered 2013-07-29: 10 mg via ORAL
  Filled 2013-07-29 (×3): qty 1

## 2013-07-29 MED ORDER — HYDROCODONE-ACETAMINOPHEN 7.5-325 MG PO TABS
1.0000 | ORAL_TABLET | Freq: Four times a day (QID) | ORAL | Status: DC | PRN
  Administered 2013-07-29 (×2): 1 via ORAL
  Administered 2013-07-30 – 2013-08-03 (×16): 2 via ORAL
  Filled 2013-07-29 (×6): qty 2
  Filled 2013-07-29: qty 1
  Filled 2013-07-29 (×10): qty 2

## 2013-07-29 MED ORDER — ISOSORBIDE MONONITRATE ER 60 MG PO TB24
60.0000 mg | ORAL_TABLET | Freq: Every day | ORAL | Status: DC
  Administered 2013-07-29: 60 mg via ORAL
  Filled 2013-07-29 (×2): qty 1

## 2013-07-29 NOTE — Clinical Documentation Improvement (Signed)
Query #1  "Type II non-STEMI likely due to severe hypertension and demand ischemia" only documented in current H&P.  Based on your independent clinical judgement, please clarify and document in the progress notes and discharge summary if the diagnosis above or an alternative diagnosis is applicable to this admission:   - Agree with Type II non-STEMI likely due to severe hypertension and demand ischemia"    - Demand Ischemia 2/2 severe hypertension and acute diastolic heart failure   - Other Condition   - Unable to Clinically Determine    Query #2   - Moderate Respiratory Distress and tachypnea in ED requiring short term Bipap   - ABG in ED with pH 7.19;  pC02 57.6   - Respiratory Rate in high 20's to low 30's in ED.   - Longterm heavy smoker  Based on your independent clinical judgement, please clarify and document in the progress notes and discharge summary if a condition below provides greater specificity regarding the patient's respiratory status:   - Acute Respiratory Failure, improved   - Other Condition   - Unable to Clinically Determine    Thank You, Jerral Ralphathy R Melika Reder ,RN  BSN CCDS Certified Clinical Documentation Specialist: [phone] [facility]- Health Information Management

## 2013-07-29 NOTE — Consult Note (Signed)
[facility] CONSULT NOTE    Date: 07/29/2013                  Patient Name:  Tricia [name]  MRN: [id number]  DOB: 1934-01-14  Age / Sex: 80 y.o., female         PCP: Kaleen Mask, MD                 Service Requesting Consult: Cardiology (Dr. Eden Emms)                 Reason for Consult: CKD / renovascular HTN, ?work-up for RAS            History of Present Illness: Patient is a 80 y.o. female with a PMHx of CKD stage IV (2/2 DM, HTN, PVD, baseline Cr ~2-3), CAD s/p MI and CABD, PVD s/p right AKA and left iliac stenting, who was admitted to [facility] on 07/28/2013 for increased fatigue and acute severe SOB. Pt states she has had about a week of increased fatigue and occasional SOB, which got suddenly worse at home the day before yesterday; she required BiPAP in the ED and had marked elevation in BNP. She got Lasix IV and has had good improvement in her symptoms. Her BP has remained markedly elevated (as high as 200+/100+) despite Coreg, hydralazine, isosorbide, and nitro drip. Cr appears roughly at baseline, with slight increase 2.65 >>> 2.86 with Lasix IV. Nephrology is consulted for concern for RAS or other worsening CKD / contribution to HTN.  Pt reports the same as above, consistent with other providers documentation. She states otherwise, she has felt well. She denies fever, chills, chest pain, abdominal pain, LE swelling. She states she does not take NSAIDs. She reports compliance with medications. She does have difficulty with incontinence and occasionally takes trospium to help with her urination, but it causes constipation and she has been out; she doesn't think her urine habits are much different than normal.  Medications: Outpatient medications: Prescriptions prior to admission  Medication Sig Dispense Refill  . calcitRIOL (ROCALTROL) 0.25 MCG capsule Take 0.25 mcg by mouth See admin instructions. Only on Monday, Wednesday, and Friday.      . carvedilol (COREG)  25 MG tablet Take 25 mg by mouth 3 (three) times daily.      . Cyanocobalamin (B-12 PO) Take 1 tablet by mouth daily.      . ergocalciferol (VITAMIN D2) 50000 UNITS capsule Take 50,000 Units by mouth every 21 ( twenty-one) days.       . furosemide (LASIX) 40 MG tablet Take 1 tablet (40 mg total) by mouth 2 (two) times daily.  180 tablet  3  . hydrALAZINE (APRESOLINE) 50 MG tablet Take 1 tablet (50 mg total) by mouth 3 (three) times daily.  90 tablet  6  . HYDROcodone-acetaminophen (NORCO) 7.5-325 MG per tablet Take 1 tablet by mouth every 6 (six) hours as needed. For pain      . rosuvastatin (CRESTOR) 40 MG tablet Take 40 mg by mouth at bedtime.        Current medications: Current Facility-Administered Medications  Medication Dose Route Frequency Provider Last Rate Last Dose  . 0.9 %  sodium chloride infusion  250 mL Intravenous PRN Othella Boyer, MD 10 mL/hr at 07/28/13 2224 250 mL at 07/28/13 2224  . acetaminophen (TYLENOL) tablet 650 mg  650 mg Oral Q4H PRN Othella Boyer, MD      . ALPRAZolam Prudy Feeler) tablet 0.25 mg  0.25 mg Oral BID PRN Othella BoyerWilliam S Tilley, MD   0.25 mg at 07/29/13 0021  . aspirin EC tablet 81 mg  81 mg Oral Daily Othella BoyerWilliam S Tilley, MD   81 mg at 07/29/13 [id number]  . atorvastatin (LIPITOR) tablet 80 mg  80 mg Oral q1800 Othella BoyerWilliam S Tilley, MD   80 mg at 07/28/13 2239  . calcitRIOL (ROCALTROL) capsule 0.25 mcg  0.25 mcg Oral Q M,W,F Othella BoyerWilliam S Tilley, MD   0.25 mcg at 07/29/13 [phone]  . carvedilol (COREG) tablet 25 mg  25 mg Oral TID Othella BoyerWilliam S Tilley, MD   25 mg at 07/29/13 1352  . enoxaparin (LOVENOX) injection 30 mg  30 mg Subcutaneous Q24H Othella BoyerWilliam S Tilley, MD   30 mg at 07/28/13 2239  . furosemide (LASIX) injection 80 mg  80 mg Intravenous Q12H Othella BoyerWilliam S Tilley, MD   80 mg at 07/29/13 [id number]  . hydrALAZINE (APRESOLINE) tablet 50 mg  50 mg Oral TID Othella BoyerWilliam S Tilley, MD   50 mg at 07/29/13 [id number]  . HYDROcodone-acetaminophen (NORCO) 7.5-325 MG per tablet 1 tablet  1 tablet Oral Q6H  PRN Othella BoyerWilliam S Tilley, MD   1 tablet at 07/29/13 1352  . insulin aspart (novoLOG) injection 0-15 Units  0-15 Units Subcutaneous TID WC Othella BoyerWilliam S Tilley, MD      . isosorbide dinitrate (ISORDIL) tablet 10 mg  10 mg Oral TID Wendall StadePeter C Nishan, MD   10 mg at 07/29/13 1352  . nitroGLYCERIN 0.2 mg/mL in dextrose 5 % infusion  10-200 mcg/min Intravenous Titrated Peter M SwazilandJordan, MD 9 mL/hr at 07/29/13 0900 30 mcg/min at 07/29/13 0900  . sodium chloride 0.9 % injection 3 mL  3 mL Intravenous Q12H Othella BoyerWilliam S Tilley, MD   3 mL at 07/29/13 1000  . sodium chloride 0.9 % injection 3 mL  3 mL Intravenous PRN Othella BoyerWilliam S Tilley, MD          Allergies: No Known Allergies    Past Medical History: Past Medical History  Diagnosis Date  . Hypertension   . Hyperlipidemia   . Coronary artery disease     post CABG in 1999, with 2-vessel disease -- - LIMA graft to the LAD and a left radial artery graft to the PDA   . Old inferior wall myocardial infarction 1999  . 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 Tobacco dependency   . CKD (chronic kidney disease) stage 4, GFR 15-29 ml/min     followed by Dr. Arrie Aranoladonato  . Diastolic heart failure   . Aortic stenosis     Mild  . Type 2 diabetes mellitus with vascular disease   . CKD (chronic kidney disease), stage IV   . Hypertensive heart disease   . Anxiety   . Depression   . Cataract   . Ovarian cyst      Past Surgical History: Past Surgical History  Procedure Laterality Date  . Partial hysterectomy    . Total vaginal hysterectomy    . 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 Cardiac catheterization    . Iliac vein angioplasty / stenting  08/10/2010     Left common iliac stenting  . Coronary artery bypass graft  01/26/1998    Est. EF of 40-45% -- with 2-vessel disease -- LIMA graft to the LAD and a left radial artery graft to the PDA      Family History: Family History  Problem Relation  Age of Onset  . Heart attack Father 25      Social History: History   Social History  . Marital Status: Widowed    Spouse Name: N/A    Number of Children: 4  . Years of Education: N/A   Occupational History  .     Social History Main Topics  . Smoking status: Current Every Day Smoker -- 0.50 packs/day for 60 years    Types: Cigarettes  . Smokeless tobacco: Never Used  . Alcohol Use: No  . Drug Use: No  . Sexual Activity: No   Other Topics Concern  . Not on file   Social History Narrative   Lives with son              Review of Systems: As per HPI. Otherwise, full 12-system ROS was reviewed and all negative.  Vital Signs: Blood pressure 195/42, pulse 55, temperature 98.2 F (36.8 C), temperature source Oral, resp. rate 14, height 5\' 8"  (1.727 m), weight 176 lb 2.4 oz (79.9 kg), SpO2 96.00%.  Weight trends: Filed Weights   07/28/13 1822 07/28/13 1953 07/29/13 0500  Weight: 175 lb (79.379 kg) 173 lb 1 oz (78.5 kg) 176 lb 2.4 oz (79.9 kg)    Physical Exam: Gen: chronically-appearing adult female in NAD HEENT: [place]/AT, MMM, neck supple Cardiovasc: mild brady, regular rhythm, soft systolic ejection murmur  Faint diffuse bruits in abdomen, neck Pulm: CTAB, no wheezes, normal WOB  Abd: soft, nondistended, BS+, no HSM Ext: warm, well-perfused, but poor distal pulses, s/p right AKA  Left LE with mild edema at ankle Neuro/Psych: alert/oriented, sensation grossly intact  mood relatively euthymic with congruent affect  Lab results: Basic Metabolic Panel:  Recent Labs Lab 07/28/13 2016 07/29/13 0227 07/29/13 1145  NA 139 140 138  K 5.0 4.7 5.0  CL 106 108 107  CO2 22 22 20   GLUCOSE 121* 128* 138*  BUN 30* 32* 33*  CREATININE 2.65* 2.78* 2.86*  CALCIUM 8.8 8.3* 8.7    Liver Function Tests:  Recent Labs Lab 07/28/13 1651 07/28/13 2016  AST 38* 31  ALT 19 17  ALKPHOS 61 51  BILITOT 0.3 0.2*  PROT 6.8 6.3  ALBUMIN 3.0* 2.7*   No results found for this basename: LIPASE, AMYLASE,  in the  last 168 hours No results found for this basename: AMMONIA,  in the last 168 hours  CBC:  Recent Labs Lab 07/28/13 1651 07/28/13 2016  WBC 12.9* 9.0  NEUTROABS 7.4  --   HGB 13.4 11.8*  HCT 42.3 37.1  MCV 96.8 95.9  PLT 239 195    Cardiac Enzymes: No results found for this basename: CKTOTAL, CKMB, CKMBINDEX, TROPONINI,  in the last 168 hours  BNP: No components found with this basename: POCBNP,   CBG:  Recent Labs Lab 07/28/13 2151 07/29/13 0743 07/29/13 1229  GLUCAP 109* 89 116*    Microbiology: Results for orders placed during the hospital encounter of 07/28/13  MRSA PCR SCREENING     Status: None   Collection Time    07/28/13  9:52 PM      Result Value Range Status   MRSA by PCR NEGATIVE  NEGATIVE Final   Comment:            The GeneXpert MRSA Assay (FDA     approved for NASAL specimens     only), is one component of a     comprehensive MRSA colonization     surveillance program. It is not  intended to diagnose MRSA     infection nor to guide or     monitor treatment for     MRSA infections.    Coagulation Studies: No results found for this basename: LABPROT, INR,  in the last 72 hours  Urinalysis: No results found for this basename: COLORURINE, APPERANCEUR, LABSPEC, PHURINE, GLUCOSEU, HGBUR, BILIRUBINUR, KETONESUR, PROTEINUR, UROBILINOGEN, NITRITE, LEUKOCYTESUR,  in the last 72 hours    Imaging: Dg Chest Port 1 View  07/28/2013   CLINICAL DATA:  Shortness of breath.  EXAM: PORTABLE CHEST - 1 VIEW  COMPARISON:  11/02/2011.  FINDINGS: Prior CABG. Cardiomegaly with pulmonary vascular prominence and interstitial prominence consistent with congestive heart failure with interstitial pulmonary edema. Interstitial pulmonary edema this severe. Small bilateral pleural effusions. No pneumothorax. No acute osseous abnormality.  IMPRESSION: 1. Congestive heart failure with severe bilateral pulmonary interstitial edema. Small bilateral pleural effusions.  Superimposed pneumonitis cannot be excluded .  2. Prior CABG.   Electronically Signed   By: Maisie Fus  Register   On: 07/28/2013 17:06     Assessment & Plan: Pt is a 80 y.o. yo female with a PMHx of CKD stage IV (2/2 DM, HTN, PVD, baseline Cr ~2-3), CAD s/p MI and CABD, PVD s/p right AKA and left iliac stenting, who was admitted to [facility] on 07/28/2013 for increased fatigue and acute severe SOB. Nephrology is consulted for concern for RAS or other worsening CKD / contribution to HTN.  1. CKD with acute fluid overload / pulmonary edema - multifactorial (chronic HTN, PVD, DM), but renal artery duplex not suggestive of RAS. Echo from 2 years ago suggests medical renal disease. Cr appears roughly at baseline, tolerating diuresis well. - continue Lasix 80 mg IV BID for now; consider change to PO as volume overload improves as long as UOP maintains good - monitor Cr and electrolytes daily, replace K as needed - attempt strict UOP; pt incontinent, but no need for Foley, yet - change diet to renal, relaxed to 1500 mL fluid restrict - needs review of outpt records from Dr. Abel Presto, as well; no acute need for full renal US - no acute need for dialysis and would likely be a very poor long term candidate, regardless  2. Chronic HTN, poor control - uncertain exact cause of acute elevation. Also likely multifactorial, as above, with diffuse PVD. - change Coreg to labetalol 200 mg BID, Isordil to Imdur 60; may need to titrate these two - nitro drip per cardiology - otherwise monitor kidney function / fluid overload as above  3. Acute exacerbation of CHF / volume overload - as above; echo pending; otherwise monitor pulmonary status / renal function with diuresis 4. CAD / PVD / sinus bradycardia - per cardiology 5. DM - insulin per primary service 5. Tobacco use - strongly recommended complete cessation  DVT PPX - Lovenox  Please see also pending attending cosign for any edits / additions.   Bobbye Morton, MD PGY-2, [facility] 07/29/2013, 2:27 PM

## 2013-07-29 NOTE — Progress Notes (Signed)
Bilateral renal artery lduplex completed.  No evidence of significant renal artery stenosis.  Abnormal intrarenal resistive indices noted.

## 2013-07-29 NOTE — Progress Notes (Signed)
  Echocardiogram 2D Echocardiogram has been performed.  Tricia [name] 07/29/2013, 1:41 PM

## 2013-07-29 NOTE — Progress Notes (Addendum)
Patient ID: Tricia [name], female   DOB: 13-Jul-1934, 80 y.o.   MRN: [id number]    Subjective:  Stuck on bedside commode Dyspnea present but improved   Objective:  Filed Vitals:   07/29/13 0200 07/29/13 0400 07/29/13 0500 07/29/13 0740  BP: 147/37  166/40 203/136  Pulse: 57 56  59  Temp:  98 F (36.7 C)  98.2 F (36.8 C)  TempSrc:  Oral  Oral  Resp: 16 13  16   Height:      Weight:   176 lb 2.4 oz (79.9 kg)   SpO2: 97% 96%  96%    Intake/Output from previous day:  Intake/Output Summary (Last 24 hours) at 07/29/13 1000 Last data filed at 07/29/13 0700  Gross per 24 hour  Intake 640.95 ml  Output      0 ml  Net 640.95 ml    Physical Exam: Affect appropriate Chronically ill obese female  HEENT: normal Neck supple with no adenopathy JVP normal no bruits no thyromegaly Lungs basilar rales wheezing and good diaphragmatic motion Heart:  S1/S2 SEM  murmur, no rub, gallop or click PMI normal Abdomen: benighn, BS positve, no tenderness, no AAA Femoral bruits .  No HSM or HJR R amputation decreased LLE pulses  Plus one LLE  edema Neuro non-focal Skin warm and dry No muscular weakness   Lab Results: Basic Metabolic Panel:  Recent Labs  40/98/1101/13/15 2016 07/29/13 0227  NA 139 140  K 5.0 4.7  CL 106 108  CO2 22 22  GLUCOSE 121* 128*  BUN 30* 32*  CREATININE 2.65* 2.78*  CALCIUM 8.8 8.3*   Liver Function Tests:  Recent Labs  07/28/13 1651 07/28/13 2016  AST 38* 31  ALT 19 17  ALKPHOS 61 51  BILITOT 0.3 0.2*  PROT 6.8 6.3  ALBUMIN 3.0* 2.7*   No results found for this basename: LIPASE, AMYLASE,  in the last 72 hours CBC:  Recent Labs  07/28/13 1651 07/28/13 2016  WBC 12.9* 9.0  NEUTROABS 7.4  --   HGB 13.4 11.8*  HCT 42.3 37.1  MCV 96.8 95.9  PLT 239 195   Hemoglobin A1C:  Recent Labs  07/28/13 2016  HGBA1C 5.6   Thyroid Function Tests:  Recent Labs  07/28/13 2016  TSH 2.212    Imaging: Dg Chest Port 1 View  07/28/2013   CLINICAL  DATA:  Shortness of breath.  EXAM: PORTABLE CHEST - 1 VIEW  COMPARISON:  11/02/2011.  FINDINGS: Prior CABG. Cardiomegaly with pulmonary vascular prominence and interstitial prominence consistent with congestive heart failure with interstitial pulmonary edema. Interstitial pulmonary edema this severe. Small bilateral pleural effusions. No pneumothorax. No acute osseous abnormality.  IMPRESSION: 1. Congestive heart failure with severe bilateral pulmonary interstitial edema. Small bilateral pleural effusions. Superimposed pneumonitis cannot be excluded .  2. Prior CABG.   Electronically Signed   By: Maisie Fushomas  Register   On: 07/28/2013 17:06    Cardiac Studies:  ECG:  SR RBBB no acute changes    Telemetry:  SR no afib   Echo: pending  Medications:   . aspirin EC  81 mg Oral Daily  . atorvastatin  80 mg Oral q1800  . calcitRIOL  0.25 mcg Oral Q M,W,F  . carvedilol  25 mg Oral TID  . enoxaparin (LOVENOX) injection  30 mg Subcutaneous Q24H  . furosemide  80 mg Intravenous Q12H  . hydrALAZINE  50 mg Oral TID  . insulin aspart  0-15 Units Subcutaneous TID WC  .  sodium chloride  3 mL Intravenous Q12H     . nitroGLYCERIN 20 mcg/min (07/29/13 0000)    Assessment/Plan:  CHF:  In setting of HTN and CRF  Continue lasix 80mg  iv bid  Echo pending  CRF:  Don;t see w/u for RAS  Will order [place] and have Dr Lorre Munroe service see HTN:  Renovascular with known PVD  Continue diretics hydralazine add nitrates  PVD:  R AKA still smoking counseled but little motivation to quit   Charlton Haws 07/29/2013, 10:00 AM

## 2013-07-29 NOTE — Care Management Note (Signed)
    Page 1 of 1   07/29/2013     8:44:37 AM   CARE MANAGEMENT NOTE 07/29/2013  Patient:  Tricia [name],Tricia [name]   Account Number:  [id number]  Date Initiated:  07/29/2013  Documentation initiated by:  Junius CreamerWELL,DEBBIE  Subjective/Objective Assessment:   adm w pul edema, htn     Action/Plan:   lives w husband, pcp dr Andrey Campanilewilson elkins   Anticipated DC Date:     Anticipated DC Plan:           Choice offered to / List presented to:             Status of service:   Medicare Important Message given?   (If response is "NO", the following Medicare IM given date fields will be blank) Date Medicare IM given:   Date Additional Medicare IM given:    Discharge Disposition:    Per UR Regulation:  Reviewed for med. necessity/level of care/duration of stay  If discussed at Long Length of Stay Meetings, dates discussed:    Comments:

## 2013-07-29 NOTE — Consult Note (Signed)
I have seen and examined this patient and agree with the plan of care  [facility],Sindee Stucker W 07/29/2013, 3:41 PM

## 2013-07-30 LAB — RENAL FUNCTION PANEL
ALBUMIN: 2.4 g/dL — AB (ref 3.5–5.2)
BUN: 36 mg/dL — ABNORMAL HIGH (ref 6–23)
CALCIUM: 8.5 mg/dL (ref 8.4–10.5)
CO2: 22 mEq/L (ref 19–32)
Chloride: 106 mEq/L (ref 96–112)
Creatinine, Ser: 3.02 mg/dL — ABNORMAL HIGH (ref 0.50–1.10)
GFR calc non Af Amer: 14 mL/min — ABNORMAL LOW (ref >90)
GFR, EST AFRICAN AMERICAN: 16 mL/min — AB (ref >90)
Glucose, Bld: 94 mg/dL (ref 70–99)
PHOSPHORUS: 3.4 mg/dL (ref 2.3–4.6)
Potassium: 4.5 mEq/L (ref 3.7–5.3)
SODIUM: 141 meq/L (ref 137–147)

## 2013-07-30 LAB — GLUCOSE, CAPILLARY
GLUCOSE-CAPILLARY: 121 mg/dL — AB (ref 70–99)
Glucose-Capillary: 88 mg/dL (ref 70–99)
Glucose-Capillary: 128 mg/dL — ABNORMAL HIGH (ref 70–99)
Glucose-Capillary: 92 mg/dL (ref 70–99)

## 2013-07-30 MED ORDER — CLONIDINE HCL 0.1 MG PO TABS
0.1000 mg | ORAL_TABLET | Freq: Two times a day (BID) | ORAL | Status: DC
  Administered 2013-07-30 – 2013-08-01 (×5): 0.1 mg via ORAL
  Filled 2013-07-30 (×6): qty 1

## 2013-07-30 MED ORDER — ISOSORBIDE MONONITRATE ER 60 MG PO TB24
120.0000 mg | ORAL_TABLET | Freq: Every day | ORAL | Status: DC
  Administered 2013-07-30 – 2013-08-03 (×5): 120 mg via ORAL
  Filled 2013-07-30 (×5): qty 2

## 2013-07-30 MED ORDER — FUROSEMIDE 10 MG/ML IJ SOLN: 40.0000 mg | Freq: Two times a day (BID) | INTRAMUSCULAR | Status: DC

## 2013-07-30 MED ORDER — FUROSEMIDE 40 MG PO TABS
40.0000 mg | ORAL_TABLET | Freq: Two times a day (BID) | ORAL | Status: DC
  Administered 2013-07-30 – 2013-08-03 (×9): 40 mg via ORAL
  Filled 2013-07-30 (×11): qty 1

## 2013-07-30 MED ORDER — FUROSEMIDE 10 MG/ML IJ SOLN
40.0000 mg | Freq: Two times a day (BID) | INTRAMUSCULAR | Status: DC
Start: 2013-07-30 — End: 2013-07-30

## 2013-07-30 NOTE — Progress Notes (Signed)
[facility] Progress Note   Subjective:   Pt feels some better, "so so," this morning. Breathing is improved and having good urine output. Still feeling somewhat weak all over.   Objective:   BP 202/42  Pulse 61  Temp(Src) 98.4 F (36.9 C) (Oral)  Resp 17  Ht 5\' 8"  (1.727 m)  Wt 170 lb 3.1 oz (77.2 kg)  BMI 25.88 kg/m2  SpO2 95%  Physical Exam: Gen: chronically-appearing adult female in NAD  Cardiovasc: mild brady, regular rhythm, soft systolic ejection murmur   Faint diffuse bruits in abdomen, neck  Pulm: CTAB, no wheezes, normal WOB  Abd: soft, nondistended, BS+, no HSM  Ext: warm, well-perfused, but poor distal pulses, s/p right AKA   Left LE with mild edema at ankle   Bilat upper extremities with ecchymoses, mild puffiness Neuro/Psych: alert/oriented, sensation grossly intact   mood relatively euthymic with congruent affect  Labs: BMET  Recent Labs Lab 07/28/13 1651 07/28/13 2016 07/29/13 0227 07/29/13 1145 07/30/13 0241  NA 139 139 140 138 141  K 5.6* 5.0 4.7 5.0 4.5  CL 107 106 108 107 106  CO2 21 22 22 20 22   GLUCOSE 180* 121* 128* 138* 94  BUN 29* 30* 32* 33* 36*  CREATININE 2.59* 2.65* 2.78* 2.86* 3.02*  CALCIUM 8.9 8.8 8.3* 8.7 8.5  PHOS  --   --   --   --  3.4   CBC  Recent Labs Lab 07/28/13 1651 07/28/13 2016  WBC 12.9* 9.0  NEUTROABS 7.4  --   HGB 13.4 11.8*  HCT 42.3 37.1  MCV 96.8 95.9  PLT 239 195    Medications:    . aspirin EC  81 mg Oral Daily  . atorvastatin  80 mg Oral q1800  . calcitRIOL  0.25 mcg Oral Q M,W,F  . enoxaparin (LOVENOX) injection  30 mg Subcutaneous Q24H  . furosemide  40 mg Intravenous Q12H  . hydrALAZINE  50 mg Oral TID  . insulin aspart  0-15 Units Subcutaneous TID WC  . isosorbide mononitrate  120 mg Oral Daily  . labetalol  200 mg Oral BID  . sodium chloride  3 mL Intravenous Q12H     Assessment/ Plan:   Pt is a 80 y.o. yo female with a PMHx of CKD stage IV (2/2 DM, HTN, PVD,  baseline Cr ~2-3), CAD s/p MI and CABD, PVD s/p right AKA and left iliac stenting, who was admitted to [facility] on 07/28/2013 for increased fatigue and acute severe SOB. Nephrology is consulted for concern for RAS or other worsening CKD / contribution to HTN.   1. CKD with acute fluid overload / pulmonary edema - multifactorial (chronic HTN, PVD, DM), but renal artery duplex not suggestive of RAS. Echo from 2 years ago suggests medical renal disease. Cr bump to around 3 this morning, but pt has good UOP and Cr still appears relatively at baseline - reduce Lasix to 40 mg IV BID, at least for this morning - monitor Cr and electrolytes daily, replace K as needed  - attempt strict UOP; pt incontinent at baseline - continue renal diet, 1500 mL fluid restrict - no acute need for dialysis and would likely be a very poor long term candidate, regardless   2. Chronic HTN, poor control - uncertain exact cause of acute elevation. Also likely multifactorial, as above, with diffuse PVD.  - changed Coreg to labetalol 200 mg BID, but has been persistently brady and has been held - increase Imdur  to 120 - titrate / adjust meds and nitro drip per cardiology  - otherwise monitor kidney function / fluid overload as above   3. Acute exacerbation of CHF / volume overload - as above; echo essentially unchanged from two years ago; otherwise monitor pulmonary status / renal function with diuresis  4. CAD / PVD / sinus bradycardia - per cardiology  5. DM - insulin per primary service  6. Tobacco use - strongly recommended complete cessation  Please see also pending attending cosign for edits / additions.  Bobbye Mortonhristopher M Gemini Bunte, MD PGY-2, [facility] 07/30/2013, 8:57 AM

## 2013-07-30 NOTE — Progress Notes (Signed)
Patient ID: Ursula BeathMargie E Clum, female   DOB: 03/11/1934, 80 y.o.   MRN: [id number]    Subjective:  Dyspnea improved  BP still high   Objective:  Filed Vitals:   07/30/13 0345 07/30/13 0400 07/30/13 0500 07/30/13 0743  BP:  188/45  202/42  Pulse:  51  61  Temp: 98.7 F (37.1 C)   98.4 F (36.9 C)  TempSrc: Oral   Oral  Resp:  13  17  Height:      Weight:   170 lb 3.1 oz (77.2 kg)   SpO2:  93%  95%    Intake/Output from previous day:  Intake/Output Summary (Last 24 hours) at 07/30/13 0913 Last data filed at 07/30/13 0800  Gross per 24 hour  Intake    778 ml  Output   2350 ml  Net  -1572 ml    Physical Exam: Affect appropriate Chronically ill obese female  HEENT: normal Neck supple with no adenopathy JVP normal no bruits no thyromegaly Lungs basilar rales wheezing and good diaphragmatic motion Heart:  S1/S2 SEM  murmur, no rub, gallop or click PMI normal Abdomen: benighn, BS positve, no tenderness, no AAA Femoral bruits .  No HSM or HJR R amputation decreased LLE pulses  Plus one LLE  edema Neuro non-focal Skin warm and dry No muscular weakness   Lab Results: Basic Metabolic Panel:  Recent Labs  95/28/4101/14/15 1145 07/30/13 0241  NA 138 141  K 5.0 4.5  CL 107 106  CO2 20 22  GLUCOSE 138* 94  BUN 33* 36*  CREATININE 2.86* 3.02*  CALCIUM 8.7 8.5  PHOS  --  3.4   Liver Function Tests:  Recent Labs  07/28/13 1651 07/28/13 2016 07/30/13 0241  AST 38* 31  --   ALT 19 17  --   ALKPHOS 61 51  --   BILITOT 0.3 0.2*  --   PROT 6.8 6.3  --   ALBUMIN 3.0* 2.7* 2.4*  CBC:  Recent Labs  07/28/13 1651 07/28/13 2016  WBC 12.9* 9.0  NEUTROABS 7.4  --   HGB 13.4 11.8*  HCT 42.3 37.1  MCV 96.8 95.9  PLT 239 195   Hemoglobin A1C:  Recent Labs  07/28/13 2016  HGBA1C 5.6   Thyroid Function Tests:  Recent Labs  07/28/13 2016  TSH 2.212    Imaging: Dg Chest Port 1 View  07/28/2013   CLINICAL DATA:  Shortness of breath.  EXAM: PORTABLE CHEST - 1  VIEW  COMPARISON:  11/02/2011.  FINDINGS: Prior CABG. Cardiomegaly with pulmonary vascular prominence and interstitial prominence consistent with congestive heart failure with interstitial pulmonary edema. Interstitial pulmonary edema this severe. Small bilateral pleural effusions. No pneumothorax. No acute osseous abnormality.  IMPRESSION: 1. Congestive heart failure with severe bilateral pulmonary interstitial edema. Small bilateral pleural effusions. Superimposed pneumonitis cannot be excluded .  2. Prior CABG.   Electronically Signed   By: Maisie Fushomas  Register   On: 07/28/2013 17:06    Cardiac Studies:  ECG:  SR RBBB no acute changes    Telemetry:  SR no afib   Echo: EF 50-55%  Small gradient across AV no AS   Medications:   . aspirin EC  81 mg Oral Daily  . atorvastatin  80 mg Oral q1800  . calcitRIOL  0.25 mcg Oral Q M,W,F  . enoxaparin (LOVENOX) injection  30 mg Subcutaneous Q24H  . furosemide  40 mg Intravenous Q12H  . hydrALAZINE  50 mg Oral TID  . insulin  aspart  0-15 Units Subcutaneous TID WC  . isosorbide mononitrate  120 mg Oral Daily  . labetalol  200 mg Oral BID  . sodium chloride  3 mL Intravenous Q12H        Assessment/Plan:  CHF:  In setting of HTN and CRF  Change to home dose PO lasix  EF relatively preserved by echo CRF:  Don;t see w/u for RAS  Will order [place] and have Dr Lorre Munroe service see HTN:  Renovascular with known PVD  Continue diretics hydralazine,nitrates add clonidine   PVD:  R AKA still smoking counseled but little motivation to quit   Charlton Haws 07/30/2013, 9:13 AM

## 2013-07-30 NOTE — Progress Notes (Signed)
Patient has labetalol 200mg  due tonight. Her HR is sustained in mid to upper 50s, with SBP as high as low 200s. called B. Sharol HarnessSimmons, [place] for cardiology about further orders. Labetalol held and order for hydralazine 10mg  iv obtained. Patient also stated that at home her prescription is for 1-2 Norco pills every 6 hrs and that she is in a lot of pain. Order for pain meds also obtained.

## 2013-07-31 DIAGNOSIS — I2510 Atherosclerotic heart disease of native coronary artery without angina pectoris: Secondary | ICD-10-CM

## 2013-07-31 LAB — RENAL FUNCTION PANEL
Albumin: 2.4 g/dL — ABNORMAL LOW (ref 3.5–5.2)
BUN: 38 mg/dL — ABNORMAL HIGH (ref 6–23)
CO2: 24 meq/L (ref 19–32)
Calcium: 8.5 mg/dL (ref 8.4–10.5)
Chloride: 106 mEq/L (ref 96–112)
Creatinine, Ser: 3.41 mg/dL — ABNORMAL HIGH (ref 0.50–1.10)
GFR calc non Af Amer: 12 mL/min — ABNORMAL LOW (ref >90)
GFR, EST AFRICAN AMERICAN: 14 mL/min — AB (ref >90)
GLUCOSE: 110 mg/dL — AB (ref 70–99)
Phosphorus: 3.7 mg/dL (ref 2.3–4.6)
Potassium: 4.5 mEq/L (ref 3.7–5.3)
SODIUM: 140 meq/L (ref 137–147)

## 2013-07-31 LAB — GLUCOSE, CAPILLARY
GLUCOSE-CAPILLARY: 111 mg/dL — AB (ref 70–99)
Glucose-Capillary: 122 mg/dL — ABNORMAL HIGH (ref 70–99)
Glucose-Capillary: 125 mg/dL — ABNORMAL HIGH (ref 70–99)
Glucose-Capillary: 93 mg/dL (ref 70–99)

## 2013-07-31 NOTE — Progress Notes (Signed)
I have seen and examined this patient and agree with the plan of care. dscussed with Dr Eden EmmsNishan  Will sign off on patient for now [facility],Jalal Rauch W 07/31/2013, 12:18 PM

## 2013-07-31 NOTE — Progress Notes (Signed)
Patient ID: Tricia [name], female   DOB: 11/26/1933, 80 y.o.   MRN: [id number]    Subjective:  Dyspnea improved  BP better with clonidine   Objective:  Filed Vitals:   07/31/13 0300 07/31/13 0400 07/31/13 0500 07/31/13 0805  BP:   150/29 177/35  Pulse: 50 50  52  Temp:   97.5 F (36.4 C) 98.2 F (36.8 C)  TempSrc:   Oral Oral  Resp:  12    Height:      Weight:   171 lb 15.3 oz (78 kg)   SpO2:  97%  93%    Intake/Output from previous day:  Intake/Output Summary (Last 24 hours) at 07/31/13 1000 Last data filed at 07/31/13 0900  Gross per 24 hour  Intake   1340 ml  Output   1670 ml  Net   -330 ml    Physical Exam: Affect appropriate Chronically ill obese female  HEENT: normal Neck supple with no adenopathy JVP normal no bruits no thyromegaly Lungs basilar rales wheezing and good diaphragmatic motion Heart:  S1/S2 SEM  murmur, no rub, gallop or click PMI normal Abdomen: benighn, BS positve, no tenderness, no AAA Femoral bruits .  No HSM or HJR R amputation decreased LLE pulses  Plus one LLE  edema Neuro non-focal Skin warm and dry No muscular weakness   Lab Results: Basic Metabolic Panel:  Recent Labs  40/98/11 0241 07/31/13 0320  NA 141 140  K 4.5 4.5  CL 106 106  CO2 22 24  GLUCOSE 94 110*  BUN 36* 38*  CREATININE 3.02* 3.41*  CALCIUM 8.5 8.5  PHOS 3.4 3.7   Liver Function Tests:  Recent Labs  07/28/13 1651 07/28/13 2016 07/30/13 0241 07/31/13 0320  AST 38* 31  --   --   ALT 19 17  --   --   ALKPHOS 61 51  --   --   BILITOT 0.3 0.2*  --   --   PROT 6.8 6.3  --   --   ALBUMIN 3.0* 2.7* 2.4* 2.4*  CBC:  Recent Labs  07/28/13 1651 07/28/13 2016  WBC 12.9* 9.0  NEUTROABS 7.4  --   HGB 13.4 11.8*  HCT 42.3 37.1  MCV 96.8 95.9  PLT 239 195   Hemoglobin A1C:  Recent Labs  07/28/13 2016  HGBA1C 5.6   Thyroid Function Tests:  Recent Labs  07/28/13 2016  TSH 2.212    Imaging: No results found.  Cardiac Studies:  ECG:   SR RBBB no acute changes    Telemetry:  SR no afib   Echo: EF 50-55%  Small gradient across AV no AS   Medications:   . aspirin EC  81 mg Oral Daily  . atorvastatin  80 mg Oral q1800  . calcitRIOL  0.25 mcg Oral Q M,W,F  . cloNIDine  0.1 mg Oral BID  . enoxaparin (LOVENOX) injection  30 mg Subcutaneous Q24H  . furosemide  40 mg Oral BID  . hydrALAZINE  50 mg Oral TID  . insulin aspart  0-15 Units Subcutaneous TID WC  . isosorbide mononitrate  120 mg Oral Daily  . labetalol  200 mg Oral BID  . sodium chloride  3 mL Intravenous Q12H        Assessment/Plan:  CHF:  In setting of HTN and CRF  Change to home dose PO lasix  EF relatively preserved by echo CRF:  F/U renal  Duplex negative for RAS  HTN:  Renovascular with  known PVD  Continue diretics hydralazine,nitrates add clonidine   PVD:  R AKA still smoking counseled but little motivation to quit   Transfer to floor d/c am   Tricia [name] 07/31/2013, 10:00 AM

## 2013-07-31 NOTE — Progress Notes (Signed)
I have seen and examined this patient and agree with the plan of care   [facility],Tarek Cravens W 07/31/2013, 7:31 AM

## 2013-07-31 NOTE — Progress Notes (Signed)
[facility] Progress Note   Subjective:   Patient feeling exhausted. She denies SOB this am.   Urine Output markedly improved from yesterday.   Objective:   BP 150/29  Pulse 50  Temp(Src) 97.5 F (36.4 C) (Oral)  Resp 12  Ht 5\' 8"  (1.727 m)  Wt 171 lb 15.3 oz (78 kg)  BMI 26.15 kg/m2  SpO2 97% I/O last 3 completed shifts: In: 1630 [P.O.:1630] Out: 2720 [Urine:2720]    Physical Exam: Gen: chronically-appearing adult female in NAD  Cardiovasc: Bradycardia, regular rhythm, soft systolic ejection murmur  Pulm: CTAB, no wheezes, normal WOB  Abd: soft, nontender, nondistended.  Ext:  s/p right AKA   Left LE with trace edema.  Neuro: AO x 3. No focal deficits.   Labs: BMET  Recent Labs Lab 07/28/13 1651 07/28/13 2016 07/29/13 0227 07/29/13 1145 07/30/13 0241 07/31/13 0320  NA 139 139 140 138 141 140  K 5.6* 5.0 4.7 5.0 4.5 4.5  CL 107 106 108 107 106 106  CO2 21 22 22 20 22 24   GLUCOSE 180* 121* 128* 138* 94 110*  BUN 29* 30* 32* 33* 36* 38*  CREATININE 2.59* 2.65* 2.78* 2.86* 3.02* 3.41*  CALCIUM 8.9 8.8 8.3* 8.7 8.5 8.5  PHOS  --   --   --   --  3.4 3.7   CBC  Recent Labs Lab 07/28/13 1651 07/28/13 2016  WBC 12.9* 9.0  NEUTROABS 7.4  --   HGB 13.4 11.8*  HCT 42.3 37.1  MCV 96.8 95.9  PLT 239 195    Medications:    . aspirin EC  81 mg Oral Daily  . atorvastatin  80 mg Oral q1800  . calcitRIOL  0.25 mcg Oral Q M,W,F  . cloNIDine  0.1 mg Oral BID  . enoxaparin (LOVENOX) injection  30 mg Subcutaneous Q24H  . furosemide  40 mg Oral BID  . hydrALAZINE  50 mg Oral TID  . insulin aspart  0-15 Units Subcutaneous TID WC  . isosorbide mononitrate  120 mg Oral Daily  . labetalol  200 mg Oral BID  . sodium chloride  3 mL Intravenous Q12H     Assessment/ Plan:   Pt is a 80 y.o. yo female with a PMHx of CKD stage IV (2/2 DM, HTN, PVD, baseline Cr ~2-3), CAD s/p MI and CABG, PVD s/p right AKA and left iliac stenting, who was admitted to  [facility] on 07/28/2013 for increased fatigue and acute severe SOB. Nephrology is consulted for concern for RAS and worsening renal function.  1. CKD stage IV with acute fluid overload / pulmonary edema - multifactorial (chronic HTN, PVD, DM), but renal artery duplex not suggestive of RAS. Echo from 2 years ago suggests medical renal disease.   Baseline Creatinine 2.1 - 3.0. - Creatinine 3.41 this am (increased from 3.02); Good urine output over past 24 hours.  Net Output: -540 mL. - Continue home Lasix 40 mg BID.  - continue renal diet, 1500 mL fluid restrict; Monitor creatinine and electrolytes - no acute need for dialysis - Will sign off today.  2. Chronic HTN, poor control - uncertain exact cause of acute elevation. Also likely multifactorial, as above, with diffuse PVD.  - Pressures are labile.  Cardiology following. - Patient currently on Hydralazine 50 mg TID, Labetalol 200 mg BID and Clonidine 0.1 mg BID.  Could consider increase in clonidine to TID if pressures continue to be elevated.  3. Acute exacerbation of CHF / volume overload -  Lasix as above; Cardiology following.  Echo essentially unchanged from two years ago; Will monitor volume status and BMP in setting of diuresis. 4. CAD / PVD / sinus bradycardia - per cardiology  5. DM - insulin per primary service  6. Tobacco use - Tobacco cessation.  Please see attending cosign for edits / additions.  Tommie SamsJayce G Grahm Etsitty, DO PGY-2, [facility] 07/31/2013, 7:23 AM

## 2013-08-01 LAB — RENAL FUNCTION PANEL
ALBUMIN: 2.5 g/dL — AB (ref 3.5–5.2)
BUN: 35 mg/dL — AB (ref 6–23)
CALCIUM: 8.5 mg/dL (ref 8.4–10.5)
CO2: 24 mEq/L (ref 19–32)
Chloride: 105 mEq/L (ref 96–112)
Creatinine, Ser: 3.32 mg/dL — ABNORMAL HIGH (ref 0.50–1.10)
GFR calc Af Amer: 14 mL/min — ABNORMAL LOW (ref >90)
GFR calc non Af Amer: 12 mL/min — ABNORMAL LOW (ref >90)
Glucose, Bld: 96 mg/dL (ref 70–99)
PHOSPHORUS: 3.7 mg/dL (ref 2.3–4.6)
POTASSIUM: 4.7 meq/L (ref 3.7–5.3)
Sodium: 140 mEq/L (ref 137–147)

## 2013-08-01 LAB — GLUCOSE, CAPILLARY
GLUCOSE-CAPILLARY: 105 mg/dL — AB (ref 70–99)
GLUCOSE-CAPILLARY: 112 mg/dL — AB (ref 70–99)
Glucose-Capillary: 123 mg/dL — ABNORMAL HIGH (ref 70–99)
Glucose-Capillary: 101 mg/dL — ABNORMAL HIGH (ref 70–99)

## 2013-08-01 MED ORDER — CLONIDINE HCL 0.2 MG PO TABS
0.2000 mg | ORAL_TABLET | Freq: Two times a day (BID) | ORAL | Status: DC
  Administered 2013-08-01 – 2013-08-03 (×3): 0.2 mg via ORAL
  Filled 2013-08-01 (×5): qty 1

## 2013-08-01 NOTE — Progress Notes (Signed)
08/01/2013 1615 Nursing note  PT. BP noted at 163/39. HR SB 53. Joni ReiningKathryn Lawrence NP on floor and made aware. Verbal orders received ok to administer scheduled Hydralazine. Orders enacted and pt. Updated on plan of care. Will continue to monitor patient.  Tricia [name], Blanchard KelchStephanie Ingold

## 2013-08-01 NOTE — Progress Notes (Signed)
Subjective:  Breathing is much better. Still feeling significantly fatigued.  Objective:  Vital Signs in the last 24 hours: BP 177/57  Pulse 55  Temp(Src) 97.9 F (36.6 C) (Oral)  Resp 18  Ht 5\' 8"  (1.727 m)  Wt 77.1 kg (169 lb 15.6 oz)  BMI 25.85 kg/m2  SpO2 97%  Physical Exam: Elderly obese female in no acute distress Lungs:  Clear Cardiac:  Regular rhythm, normal S1 and S2, no S3, systolic ejection murmur Extremities:  Right amputation present  Intake/Output from previous day: 01/16 0701 - 01/17 0700 In: 400 [P.O.:400] Out: 850 [Urine:850]  Weight Filed Weights   07/30/13 0500 07/31/13 0500 08/01/13 0433  Weight: 77.2 kg (170 lb 3.1 oz) 78 kg (171 lb 15.3 oz) 77.1 kg (169 lb 15.6 oz)    Lab Results: Basic Metabolic Panel:  Recent Labs  16/04/9600/16/15 0320 08/01/13 0403  NA 140 140  K 4.5 4.7  CL 106 105  CO2 24 24  GLUCOSE 110* 96  BUN 38* 35*  CREATININE 3.41* 3.32*   Telemetry: Reviewed   Assessment/Plan: 1. Accelerated hypertension with blood pressure still elevated 2. Severe peripheral last for disease 3. Pulmonary edema-resolved 4. Acute diastolic heart failure improved 5. Acute on chronic renal insufficiency  Recommendations:  Her blood pressure is still significantly elevated. I will increase her clonidine at the present time. Hold on discharge for now.      Darden PalmerW. Spencer Tilley, Jr.  MD [facility]  08/01/2013, 1:42 PM

## 2013-08-02 LAB — RENAL FUNCTION PANEL
Albumin: 2.5 g/dL — ABNORMAL LOW (ref 3.5–5.2)
BUN: 40 mg/dL — ABNORMAL HIGH (ref 6–23)
CALCIUM: 8.3 mg/dL — AB (ref 8.4–10.5)
CHLORIDE: 102 meq/L (ref 96–112)
CO2: 24 meq/L (ref 19–32)
CREATININE: 3.4 mg/dL — AB (ref 0.50–1.10)
GFR calc Af Amer: 14 mL/min — ABNORMAL LOW (ref >90)
GFR calc non Af Amer: 12 mL/min — ABNORMAL LOW (ref >90)
GLUCOSE: 105 mg/dL — AB (ref 70–99)
Phosphorus: 4.1 mg/dL (ref 2.3–4.6)
Potassium: 4.8 mEq/L (ref 3.7–5.3)
Sodium: 139 mEq/L (ref 137–147)

## 2013-08-02 LAB — GLUCOSE, CAPILLARY
GLUCOSE-CAPILLARY: 104 mg/dL — AB (ref 70–99)
Glucose-Capillary: 168 mg/dL — ABNORMAL HIGH (ref 70–99)
Glucose-Capillary: 100 mg/dL — ABNORMAL HIGH (ref 70–99)
Glucose-Capillary: 100 mg/dL — ABNORMAL HIGH (ref 70–99)

## 2013-08-02 MED ORDER — HYDRALAZINE HCL 50 MG PO TABS
75.0000 mg | ORAL_TABLET | Freq: Three times a day (TID) | ORAL | Status: DC
  Administered 2013-08-02 – 2013-08-03 (×3): 75 mg via ORAL
  Filled 2013-08-02 (×5): qty 1

## 2013-08-02 NOTE — Progress Notes (Signed)
Subjective:  Breathing is much better. Still feeling significantly fatigued, feels dopey and not comfortable with going home.  Objective:  Vital Signs in the last 24 hours: BP 180/54  Pulse 51  Temp(Src) 97.9 F (36.6 C) (Oral)  Resp 18  Ht 5\' 8"  (1.727 m)  Wt 77.8 kg (171 lb 8.3 oz)  BMI 26.09 kg/m2  SpO2 92%  Physical Exam: Elderly obese female in no acute distress Lungs:  Clear Cardiac:  Regular rhythm, normal S1 and S2, no S3, systolic ejection murmur Extremities:  Right amputation present  Intake/Output from previous day: 01/17 0701 - 01/18 0700 In: 1920 [P.O.:1920] Out: 2750 [Urine:2750]  Weight Filed Weights   07/31/13 0500 08/01/13 0433 08/02/13 0037  Weight: 78 kg (171 lb 15.3 oz) 77.1 kg (169 lb 15.6 oz) 77.8 kg (171 lb 8.3 oz)    Lab Results: Basic Metabolic Panel:  Recent Labs  16/04/9600/17/15 0403 08/02/13 0450  NA 140 139  K 4.7 4.8  CL 105 102  CO2 24 24  GLUCOSE 96 105*  BUN 35* 40*  CREATININE 3.32* 3.40*   Telemetry: Reviewed   Assessment/Plan: 1. Accelerated hypertension with blood pressure still elevated 2. Severe peripheral last for disease 3. Pulmonary edema-resolved 4. Acute diastolic heart failure improved 5. Acute on chronic renal insufficiency  Recommendations:  Hold discharge for now.  Increase Hydralazine.  May need to try a different agent for BP control.     Darden PalmerW. Spencer Oluwatosin Higginson, Jr.  MD [facility]  08/02/2013, 12:33 PM

## 2013-08-03 DIAGNOSIS — I5032 Chronic diastolic (congestive) heart failure: Secondary | ICD-10-CM | POA: Diagnosis present

## 2013-08-03 DIAGNOSIS — I5033 Acute on chronic diastolic (congestive) heart failure: Secondary | ICD-10-CM

## 2013-08-03 DIAGNOSIS — F17200 Nicotine dependence, unspecified, uncomplicated: Secondary | ICD-10-CM

## 2013-08-03 DIAGNOSIS — E1159 Type 2 diabetes mellitus with other circulatory complications: Secondary | ICD-10-CM

## 2013-08-03 DIAGNOSIS — I161 Hypertensive emergency: Secondary | ICD-10-CM | POA: Diagnosis present

## 2013-08-03 DIAGNOSIS — I509 Heart failure, unspecified: Secondary | ICD-10-CM

## 2013-08-03 DIAGNOSIS — I798 Other disorders of arteries, arterioles and capillaries in diseases classified elsewhere: Secondary | ICD-10-CM

## 2013-08-03 LAB — RENAL FUNCTION PANEL
ALBUMIN: 2.5 g/dL — AB (ref 3.5–5.2)
BUN: 42 mg/dL — AB (ref 6–23)
CALCIUM: 8.4 mg/dL (ref 8.4–10.5)
CO2: 23 mEq/L (ref 19–32)
CREATININE: 3.22 mg/dL — AB (ref 0.50–1.10)
Chloride: 102 mEq/L (ref 96–112)
GFR calc Af Amer: 15 mL/min — ABNORMAL LOW (ref >90)
GFR calc non Af Amer: 13 mL/min — ABNORMAL LOW (ref >90)
GLUCOSE: 102 mg/dL — AB (ref 70–99)
PHOSPHORUS: 4.3 mg/dL (ref 2.3–4.6)
Potassium: 5.1 mEq/L (ref 3.7–5.3)
Sodium: 137 mEq/L (ref 137–147)

## 2013-08-03 LAB — GLUCOSE, CAPILLARY
GLUCOSE-CAPILLARY: 97 mg/dL (ref 70–99)
GLUCOSE-CAPILLARY: 121 mg/dL — AB (ref 70–99)

## 2013-08-03 MED ORDER — ASPIRIN 81 MG PO TBEC: 81.0000 mg | DELAYED_RELEASE_TABLET | Freq: Every day | ORAL | Status: AC

## 2013-08-03 MED ORDER — LABETALOL HCL 200 MG PO TABS: 200.0000 mg | ORAL_TABLET | Freq: Two times a day (BID) | ORAL | Status: DC

## 2013-08-03 MED ORDER — ALPRAZOLAM 0.25 MG PO TABS: 0.2500 mg | ORAL_TABLET | Freq: Two times a day (BID) | ORAL | Status: DC | PRN

## 2013-08-03 MED ORDER — ISOSORBIDE MONONITRATE ER 120 MG PO TB24: 120.0000 mg | ORAL_TABLET | Freq: Every day | ORAL | Status: DC

## 2013-08-03 MED ORDER — CLONIDINE HCL 0.2 MG PO TABS: 0.2000 mg | ORAL_TABLET | Freq: Two times a day (BID) | ORAL | Status: DC

## 2013-08-03 MED ORDER — HYDRALAZINE HCL 50 MG PO TABS: 75.0000 mg | ORAL_TABLET | Freq: Three times a day (TID) | ORAL | Status: DC

## 2013-08-03 NOTE — Progress Notes (Signed)
Patient experienced a 1.94 second pause at approximately 4:23 am, patient was asleep and was otherwise asymptomatic. Heart rate continues brady, will continue to monitor.

## 2013-08-03 NOTE — Discharge Summary (Signed)
Patient seen and examined and history reviewed. Agree with above findings and plan. See my earlier rounding note.  Theron Aristaeter JordanMD 08/03/2013 1:00 PM

## 2013-08-03 NOTE — Discharge Summary (Signed)
Physician Discharge Summary  Patient ID: Tricia [name] MRN: [id number] DOB/AGE: Sep 28, 1933 80 y.o.  Admit date: 07/28/2013 Discharge date: 08/03/2013  Admission Diagnoses: Acute on Chronic Diastolic CHF  Discharge Diagnoses:  Principal Problem:   Acute diastolic CHF (congestive heart failure) Active Problems:   Peripheral vascular disease   CKD (chronic kidney disease), stage IV   Tobacco dependency   Type 2 diabetes mellitus with vascular disease   Pulmonary edema   Hypertensive emergency   Discharged Condition: stable  Hospital Course: The patient is a 80 year old female, followed by Dr. Peter Swaziland, who has a previous history of an inferior infarction, s/p CABG x 2 in 1999, diastolic heart failure, diabetes, Stage IV CKD, severe peripheral vascular disease, hypertensive heart disease and continued tobacco abuse, who presented to [facility] on 07/28/13 with a complaint of shortness of breath and increased fatigue. Work-up in the ER revealed that she had pulmonary edema and was markedly hypertensive with a BP of 234/94. Her BNP was elevated at 32,203. Her EKG was w/o acute changes.  She was treated with intravenous nitroglycerin, BiPAP and intravenous Lasix with improvement in her symptoms. She was admitted to stepdown and was continued on IV diuretics. Serial cardiac enzymes were cycled and were negative x 3.  A repeat echocardiogram revealed increased wall thickness, compared to prior study, in a pattern of mild LVH. Systolic function was normal. The estimated ejection fraction was in the range of 50-55%. She was noted to have mild AR and MR. The atrium was moderately dilated. Nephrology was consulted and provided recommendations on diuretic therapy and assisted with her antihypertensive regimen. She had good diuresis ~3.8 L total. She was transitioned from IV to 40 mg PO Lasix BID. For her hypertension, her BB was changed from Coreg to Labetalol. Hydralazine was titrated from 50 mg to 75 mg  TID. Her Imdur was also increased to 120 mg and she was started on clonidine BID. Her blood pressure improved. She had no further issues. She was last seen and examined by Dr. Swaziland, who determined she was stable for discharge home. She was instructed to perform daily BP checks at home until seen in follow up. She was advised at adhere to a low sodium diet and to quit smoking. She will follow up in clinic on 08/13/12 with Tereso Newcomer, PA-C.    Consults: nephrology  Significant Diagnostic Studies:   2D echo 07/29/13 Study Conclusions  - Left ventricle: Posterior basal hypokinesis The cavity size was mildly dilated. Wall thickness was increased in a pattern of mild LVH. Systolic function was normal. The estimated ejection fraction was in the range of 50% to 55%. - Aortic valve: Small gradient across the valve without significant stenosis Mild regurgitation. - Mitral valve: Mild regurgitation. - Left atrium: The atrium was moderately dilated.   Treatments: See Hospital Course  Discharge Exam: Blood pressure 132/57, pulse 50, temperature 97.7 F (36.5 C), temperature source Oral, resp. rate 20, height 5\' 8"  (1.727 m), weight 168 lb 11.2 oz (76.522 kg), SpO2 93.00%.  Disposition: 01-Home or Self Care      Discharge Orders   Future Appointments Provider Department Dept Phone   08/13/2013 11:45 AM Beatrice Lecher, PA-C [facility] [phone]   Future Orders Complete By Expires   Diet - low sodium heart healthy  As directed    Increase activity slowly  As directed        Medication List    STOP taking these medications  carvedilol 25 MG tablet  Commonly known as:  COREG      TAKE these medications       aspirin 81 MG EC tablet  Take 1 tablet (81 mg total) by mouth daily.     B-12 PO  Take 1 tablet by mouth daily.     calcitRIOL 0.25 MCG capsule  Commonly known as:  ROCALTROL  Take 0.25 mcg by mouth See admin instructions. Only on Monday,  Wednesday, and Friday.     cloNIDine 0.2 MG tablet  Commonly known as:  CATAPRES  Take 1 tablet (0.2 mg total) by mouth 2 (two) times daily.     ergocalciferol 50000 UNITS capsule  Commonly known as:  VITAMIN D2  Take 50,000 Units by mouth every 21 ( twenty-one) days.     furosemide 40 MG tablet  Commonly known as:  LASIX  Take 1 tablet (40 mg total) by mouth 2 (two) times daily.     hydrALAZINE 50 MG tablet  Commonly known as:  APRESOLINE  Take 1.5 tablets (75 mg total) by mouth 3 (three) times daily.     HYDROcodone-acetaminophen 7.5-325 MG per tablet  Commonly known as:  NORCO  Take 1 tablet by mouth every 6 (six) hours as needed. For pain     isosorbide mononitrate 120 MG 24 hr tablet  Commonly known as:  IMDUR  Take 1 tablet (120 mg total) by mouth daily.     labetalol 200 MG tablet  Commonly known as:  NORMODYNE  Take 1 tablet (200 mg total) by mouth 2 (two) times daily.     rosuvastatin 40 MG tablet  Commonly known as:  CRESTOR  Take 40 mg by mouth at bedtime.       Follow-up Information   Follow up with Tereso NewcomerScott Weaver, PA-C On 08/13/2013. (11:45am )    Specialty:  Physician Assistant   Contact information:   [address] [place] [address] [phone]      TIME SPENT ON DISCHARGE, INCLUDING PHYSICIAN TIME: > 30 MINUTES  Signed: Allayne ButcherBRITTAINY M. Marlowe Cinquemani, PA-C 08/03/2013, 9:35 AM

## 2013-08-03 NOTE — Discharge Instructions (Signed)
Check your blood pressure at home daily. Eat a low sodium diet. Quit smoking. Follow-up in office in 1-2 weeks.

## 2013-08-03 NOTE — Progress Notes (Signed)
TELEMETRY: Reviewed telemetry pt in NSR with occ PAC: Filed Vitals:   08/02/13 0945 08/02/13 1304 08/02/13 2040 08/03/13 0400  BP: 180/54 143/62 162/52 132/57  Pulse:  54 53 50  Temp:  97.4 F (36.3 C) 98.1 F (36.7 C) 97.7 F (36.5 C)  TempSrc:  Oral Oral Oral  Resp:  18 19 20   Height:      Weight:    168 lb 11.2 oz (76.522 kg)  SpO2:  92% 96% 93%    Intake/Output Summary (Last 24 hours) at 08/03/13 0856 Last data filed at 08/03/13 0435  Gross per 24 hour  Intake    720 ml  Output   2300 ml  Net  -1580 ml    SUBJECTIVE Feels well. Denies SOB. No chest pain. Still feels fatigued but felt very fatigued for 10 days prior to admit.  LABS: Basic Metabolic Panel:  Recent Labs  40/98/1101/18/15 0450 08/03/13 0328  NA 139 137  K 4.8 5.1  CL 102 102  CO2 24 23  GLUCOSE 105* 102*  BUN 40* 42*  CREATININE 3.40* 3.22*  CALCIUM 8.3* 8.4  PHOS 4.1 4.3   Liver Function Tests:  Recent Labs  08/02/13 0450 08/03/13 0328  ALBUMIN 2.5* 2.5*    Radiology/Studies:  Dg Chest Port 1 View  07/28/2013   CLINICAL DATA:  Shortness of breath.  EXAM: PORTABLE CHEST - 1 VIEW  COMPARISON:  11/02/2011.  FINDINGS: Prior CABG. Cardiomegaly with pulmonary vascular prominence and interstitial prominence consistent with congestive heart failure with interstitial pulmonary edema. Interstitial pulmonary edema this severe. Small bilateral pleural effusions. No pneumothorax. No acute osseous abnormality.  IMPRESSION: 1. Congestive heart failure with severe bilateral pulmonary interstitial edema. Small bilateral pleural effusions. Superimposed pneumonitis cannot be excluded .  2. Prior CABG.   Electronically Signed   By: Maisie Fushomas  Register   On: 07/28/2013 17:06   Ecg: 07/29/13 NSR with RBBB and old inferior infarct.  Echo: Study Conclusions  - Left ventricle: Posterior basal hypokinesis The cavity size was mildly dilated. Wall thickness was increased in a pattern of mild LVH. Systolic function was  normal. The estimated ejection fraction was in the range of 50% to 55%. - Aortic valve: Small gradient across the valve without significant stenosis Mild regurgitation. - Mitral valve: Mild regurgitation. - Left atrium: The atrium was moderately dilated  Renal artery duplex:Summary:  - No evidence of renal artery stenosis noted bilaterally. - Bilateral normal intrarenal resistive indices. - Right RAR is 0.73 and left is 0.67.    PHYSICAL EXAM General: Well developed, chronically ill appearing, in no acute distress. Head: Normocephalic, atraumatic, sclera non-icteric, no xanthomas, nares are without discharge. Neck: Negative for carotid bruits. JVD not elevated. Lungs: Few crackles. Breathing is unlabored. No wheezing. Heart: RRR S1 S2 with 1/6 SEM Abdomen: Soft, non-tender, non-distended with normoactive bowel sounds. No hepatomegaly. No rebound/guarding. No obvious abdominal masses. Extremities: No clubbing, cyanosis or edema.  Decreased pulse LLE. Right leg amputee. Neuro: Alert and oriented X 3. Moves all extremities spontaneously. Psych:  Responds to questions appropriately with a normal affect.  ASSESSMENT AND PLAN: 1. Acute diastolic CHF. Diuresed 7 lbs since admission with stable renal indices. Continue Lasix 40 mg bid. 2. Hypertensive emergency with CHF. BP is much better today. Now on Clonidine. Coreg switched to labetolol. Hydralazine increased to 75 mg tid.  3. CKD stage 4. 4. PAD s/p right AKA 5. Tobacco abuse. Counseled on smoking cessation.  Disposition: stable for DC today. Will monitor  BP daily at home. Will need follow up in 1-2 weeks. Sodium restriction. Stop smoking.  Principal Problem:   Acute diastolic CHF (congestive heart failure) Active Problems:   Peripheral vascular disease   CKD (chronic kidney disease), stage IV   Tobacco dependency   Type 2 diabetes mellitus with vascular disease   Pulmonary edema   Hypertensive emergency    Signed, Peter  Swaziland MD,FACC 08/03/2013 8:56 AM

## 2013-08-04 NOTE — Progress Notes (Signed)
Pt/family given discharge instructions, medication lists, follow up appointments, and when to call the doctor.  Pt/family verbalizes understanding. Willy Vorce McClintock    

## 2013-08-13 ENCOUNTER — Encounter: Payer: Medicare Other | Admitting: Physician Assistant

## 2013-08-19 ENCOUNTER — Encounter: Payer: Self-pay | Admitting: Physician Assistant

## 2013-08-19 ENCOUNTER — Ambulatory Visit (INDEPENDENT_AMBULATORY_CARE_PROVIDER_SITE_OTHER): Payer: Medicare Other | Admitting: Physician Assistant

## 2013-08-19 VITALS — BP 142/50 | HR 65 | Ht 68.0 in | Wt 170.0 lb

## 2013-08-19 DIAGNOSIS — N184 Chronic kidney disease, stage 4 (severe): Secondary | ICD-10-CM

## 2013-08-19 DIAGNOSIS — I5031 Acute diastolic (congestive) heart failure: Secondary | ICD-10-CM

## 2013-08-19 DIAGNOSIS — F17200 Nicotine dependence, unspecified, uncomplicated: Secondary | ICD-10-CM

## 2013-08-19 DIAGNOSIS — I509 Heart failure, unspecified: Secondary | ICD-10-CM

## 2013-08-19 DIAGNOSIS — I161 Hypertensive emergency: Secondary | ICD-10-CM

## 2013-08-19 DIAGNOSIS — I10 Essential (primary) hypertension: Secondary | ICD-10-CM

## 2013-08-19 NOTE — Assessment & Plan Note (Signed)
Blood pressure is compensated today. Seems to go up with anxiety. She is to contact her primary care for Xanax refills.

## 2013-08-19 NOTE — Patient Instructions (Signed)
Your physician recommends that you schedule a follow-up appointment in: 2 months with Dr. SwazilandJordan  Your physician recommends that you continue on your current medications as directed. Please refer to the Current Medication list given to you today.  Your physician discussed the hazards of tobacco use. STOP SMOKING  ,

## 2013-08-19 NOTE — Assessment & Plan Note (Signed)
Scheduled to see Dr. Arrie Aranoladonato and blood work on Monday.

## 2013-08-19 NOTE — Assessment & Plan Note (Signed)
Patient's heart failure is well compensated today. Her blood pressure is also stable. She says she is due for blood work by Dr. Arrie Aranoladonato on Monday so I will not do blood work today.

## 2013-08-19 NOTE — Assessment & Plan Note (Signed)
Smoking cessation discussed 

## 2013-08-19 NOTE — Progress Notes (Signed)
HPI:  This is a 80 year old female patient of Dr. Peter SwazilandJordan who was recently hospitalized with hypertensive emergency and acute on chronic diastolic heart failure. She was treated with diuretics and BiPAP. Cardiac enzymes are negative. 2-D echo revealed increased wall thickness compared to prior study with mild LVH normal systolic function, EF 50-55%. She had mild AI and MR. Nephrology saw her and recommended diuretic therapy as well as an eye hypertensive regimen.  She also has a history of stage IV chronic kidney disease, diabetes mellitus, peripheral vascular disease, tobacco abuse, and hypertension.  Patient comes in today not feeling very well. She says she is fatigued, anxious, and shaky. She says she's never had anxiety before and the Xanax has really helped her but she's run out of this. She does have a lot going on in her life and she's tried to cut back on her cigarette smoking dramatically. She denies any chest pain, palpitations, dyspnea, orthopnea, dizziness, or presyncope. Her blood pressures are up and down at home but usually high if she becomes anxious. They get as high the 198/60.  No Known Allergies  Current Outpatient Prescriptions on File Prior to Visit: ALPRAZolam (XANAX) 0.25 MG tablet, Take 1 tablet (0.25 mg total) by mouth 2 (two) times daily as needed for anxiety., Disp: 30 tablet, Rfl: 0 aspirin EC 81 MG EC tablet, Take 1 tablet (81 mg total) by mouth daily., Disp: , Rfl:  calcitRIOL (ROCALTROL) 0.25 MCG capsule, Take 0.25 mcg by mouth See admin instructions. Only on Monday, Wednesday, and Friday., Disp: , Rfl:  cloNIDine (CATAPRES) 0.2 MG tablet, Take 1 tablet (0.2 mg total) by mouth 2 (two) times daily., Disp: 60 tablet, Rfl: 5 Cyanocobalamin (B-12 PO), Take 1 tablet by mouth daily., Disp: , Rfl:  ergocalciferol (VITAMIN D2) 50000 UNITS capsule, Take 50,000 Units by mouth every 21 ( twenty-one) days. , Disp: , Rfl:  furosemide (LASIX) 40 MG tablet, Take 1 tablet (40 mg  total) by mouth 2 (two) times daily., Disp: 180 tablet, Rfl: 3 hydrALAZINE (APRESOLINE) 50 MG tablet, Take 1.5 tablets (75 mg total) by mouth 3 (three) times daily., Disp: 135 tablet, Rfl: 6 HYDROcodone-acetaminophen (NORCO) 7.5-325 MG per tablet, Take 1 tablet by mouth every 6 (six) hours as needed. For pain, Disp: , Rfl:  isosorbide mononitrate (IMDUR) 120 MG 24 hr tablet, Take 1 tablet (120 mg total) by mouth daily., Disp: 30 tablet, Rfl: 5 labetalol (NORMODYNE) 200 MG tablet, Take 1 tablet (200 mg total) by mouth 2 (two) times daily., Disp: 60 tablet, Rfl: 5 rosuvastatin (CRESTOR) 40 MG tablet, Take 40 mg by mouth at bedtime., Disp: , Rfl:   No current facility-administered medications on file prior to visit.   Past Medical History:   Hypertension                                                 Hyperlipidemia                                               Coronary artery disease  Comment:post CABG in 1999, with 2-vessel disease -- -               LIMA graft to the LAD and a left radial artery               graft to the PDA    Old inferior wall myocardial infarction         1999         Peripheral vascular disease                                    Comment:post AKA on right   Tobacco dependency                                           CKD (chronic kidney disease) stage 4, GFR 15-2*                Comment:followed by Dr. Arrie Aran   Diastolic heart failure                                      Aortic stenosis                                                Comment:Mild   Type 2 diabetes mellitus with vascular disease               CKD (chronic kidney disease), stage IV                       Hypertensive heart disease                                   Anxiety                                                      Depression                                                   Cataract                                                     Ovarian cyst                                                 Past Surgical History:   PARTIAL HYSTERECTOMY  TOTAL VAGINAL HYSTERECTOMY                                    LEG AMPUTATION BELOW KNEE                        1996           Comment:right   CARDIAC CATHETERIZATION                                       ILIAC VEIN ANGIOPLASTY / STENTING                01/26/201*     Comment:Left common iliac stenting   CORONARY ARTERY BYPASS GRAFT                     01/26/1998     Comment:Est. EF of 40-45% -- with 2-vessel disease --               LIMA graft to the LAD and a left radial artery               graft to the PDA   Review of patient's family history indicates:   Heart attack                   Father                   Social History   Marital Status: Widowed             Spouse Name:                      Years of Education:                 Number of children: 4           Occupational History Occupation          Psychiatric nurse                Social History Main Topics   Smoking Status: Current Every Day Smoker        Packs/Day: 0.50  Years: 60        Types: Cigarettes   Smokeless Status: Never Used                       Alcohol Use: No             Drug Use: No             Sexual Activity: No                 Other Topics            Concern   None on file  Social History Narrative   Lives with son       ROS: See history of present illness otherwise negative   PHYSICAL EXAM: Elderly, in a wheelchair, in no acute distress. Neck: No JVD, HJR, Bruit, or thyroid enlargement  Lungs: Decreased breath sounds but No tachypnea, clear without wheezing, rales, or rhonchi  Cardiovascular: RRR, PMI not displaced, 2/6 diastolic murmur at the left sternal border and 2/6 systolic murmur at the  apex, no bruit, thrill, or heave.  Abdomen: BS normal. Soft without organomegaly, masses, lesions or tenderness.  Extremities: Right AKA left lower  extremity without cyanosis, clubbing or edema. Decreased distal pulses on left  SKin: Warm, no lesions or rashes   Musculoskeletal: No deformities  Neuro: no focal signs  BP 142/50  Pulse 65  Ht 5\' 8"  (1.727 m)  Wt 170 lb (77.111 kg)  BMI 25.85 kg/m2    EKG: Normal sinus rhythm with first degree AV block, right bundle branch block inferior infarct, no acute change  2D echo 07/29/13 Study Conclusions  - Left ventricle: Posterior basal hypokinesis The cavity size was mildly dilated. Wall thickness was increased in a pattern of mild LVH. Systolic function was normal. The estimated ejection fraction was in the range of 50% to 55%. - Aortic valve: Small gradient across the valve without significant stenosis Mild regurgitation. - Mitral valve: Mild regurgitation. - Left atrium: The atrium was moderately dilated.

## 2013-09-02 ENCOUNTER — Telehealth: Payer: Self-pay | Admitting: Cardiology

## 2013-09-02 NOTE — Telephone Encounter (Signed)
Spoke with Dr. Windle GuardWilson Elkins by phone today. Tricia PetersMargie [name] called with complaints of tremors. This started after her last hospitalization at which time her hydralazine was increased and she was placed on Clonidine. Both of which can cause tremor. When seen on February 4 by Tricia [name] her BP was 142/50. We agreed to reduce Hydralazine back to her prior dose of 50 mg tid and reduce clonidine to 0.1 mg bid and she if her tremor improves. She was taking Xanax for her tremors but it sounds like this is more of a medication side effect. Will arrange follow up with me in a couple of weeks.  Tricia [name] SwazilandJordan MD, [facility]ACC

## 2013-09-17 ENCOUNTER — Telehealth: Payer: Self-pay

## 2013-09-17 NOTE — Telephone Encounter (Signed)
Spoke to patient she stated she is not taking Clonidine.Stated is taking Hydralazine 50 mg 1 1/2 tablets three times a day,Lasix 40 mg am,20 mg pm.B/P 150/60,170/60 pulse 60 to 62 beats/min.Stated she continues to have tremors.Dr.Jordan advised don't think medication causing tremors.Advised to see Dr.Elkins.Advised to keep appointment with Dr.Jordan 10/02/13.

## 2013-10-02 ENCOUNTER — Encounter: Payer: Self-pay | Admitting: Cardiology

## 2013-10-02 ENCOUNTER — Ambulatory Visit (INDEPENDENT_AMBULATORY_CARE_PROVIDER_SITE_OTHER): Payer: Medicare Other | Admitting: Cardiology

## 2013-10-02 VITALS — BP 160/54 | HR 55 | Ht 68.0 in | Wt 169.8 lb

## 2013-10-02 DIAGNOSIS — I119 Hypertensive heart disease without heart failure: Secondary | ICD-10-CM

## 2013-10-02 DIAGNOSIS — I5032 Chronic diastolic (congestive) heart failure: Secondary | ICD-10-CM

## 2013-10-02 DIAGNOSIS — E785 Hyperlipidemia, unspecified: Secondary | ICD-10-CM

## 2013-10-02 DIAGNOSIS — I509 Heart failure, unspecified: Secondary | ICD-10-CM

## 2013-10-02 DIAGNOSIS — I2510 Atherosclerotic heart disease of native coronary artery without angina pectoris: Secondary | ICD-10-CM

## 2013-10-02 DIAGNOSIS — N184 Chronic kidney disease, stage 4 (severe): Secondary | ICD-10-CM

## 2013-10-02 DIAGNOSIS — I739 Peripheral vascular disease, unspecified: Secondary | ICD-10-CM

## 2013-10-02 NOTE — Patient Instructions (Signed)
Continue your curren therapy  I will get a copy of your lab work from Dr. Jeannetta NapElkins and Dr. Geanie Berlinolodanato  I will see you in 3 months.

## 2013-10-03 NOTE — Progress Notes (Signed)
Tricia [name] Date of Birth: 10/19/1933 Medical Record #[id number]  History of Present Illness: Delray AltMargie is seen today for a followup visit. She was hospitalized in January with hypertensive emergency and acute on chronic diastolic heart failure. She was treated with diuretics and BiPAP. Her BP medication was increased with addition of clonidine and increase hydralazine.  2-D echo revealed increased wall thickness compared to prior study with mild LVH normal systolic function, EF 50-55%. She had mild AI and MR.  She also has a history of stage IV chronic kidney disease, diabetes mellitus, peripheral vascular disease, tobacco abuse, and hypertension. Since DC she developed significant tremulousness. This was felt to be related to clonidine and this was discontinued. He hydralazine was also reduced. BP remained high so she went back up on hydralazine to 75 mg tid. She states the tremulousness is much better. She does complain of fatigue and feeling sleepy-headed. She reports her BP has been under good control at home. She does increased bruisability.   Current Outpatient Prescriptions on File Prior to Visit  Medication Sig Dispense Refill  . aspirin EC 81 MG EC tablet Take 1 tablet (81 mg total) by mouth daily.      . Cyanocobalamin (B-12 PO) Take 1 tablet by mouth daily.      . ergocalciferol (VITAMIN D2) 50000 UNITS capsule Take 50,000 Units by mouth every 21 ( twenty-one) days.       . furosemide (LASIX) 40 MG tablet Take 1 tablet (40 mg total) by mouth 2 (two) times daily.  180 tablet  3  . hydrALAZINE (APRESOLINE) 50 MG tablet Take 1.5 tablets (75 mg total) by mouth 3 (three) times daily.  135 tablet  6  . HYDROcodone-acetaminophen (NORCO) 7.5-325 MG per tablet Take 1 tablet by mouth every 6 (six) hours as needed. For pain      . isosorbide mononitrate (IMDUR) 120 MG 24 hr tablet Take 1 tablet (120 mg total) by mouth daily.  30 tablet  5  . labetalol (NORMODYNE) 200 MG tablet Take 1 tablet  (200 mg total) by mouth 2 (two) times daily.  60 tablet  5  . rosuvastatin (CRESTOR) 40 MG tablet Take 40 mg by mouth at bedtime.      . Trospium Chloride 60 MG CP24        No current facility-administered medications on file prior to visit.    No Known Allergies  Past Medical History  Diagnosis Date  . Hypertension   . Hyperlipidemia   . Coronary artery disease     post CABG in 1999, with 2-vessel disease -- - LIMA graft to the LAD and a left radial artery graft to the PDA   . Old inferior wall myocardial infarction 1999  . 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 Tobacco dependency   . CKD (chronic kidney disease) stage 4, GFR 15-29 ml/min     followed by Dr. Arrie Aranoladonato  . Diastolic heart failure   . Aortic stenosis     Mild  . Type 2 diabetes mellitus with vascular disease   . CKD (chronic kidney disease), stage IV   . Hypertensive heart disease   . Anxiety   . Depression   . Cataract   . Ovarian cyst     Past Surgical History  Procedure Laterality Date  . Partial hysterectomy    . Total vaginal hysterectomy    . 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  Cardiac catheterization    . Iliac vein angioplasty / stenting  08/10/2010     Left common iliac stenting  . Coronary artery bypass graft  01/26/1998    Est. EF of 40-45% -- with 2-vessel disease -- LIMA graft to the LAD and a left radial artery graft to the PDA     History  Smoking status  . Current Every Day Smoker -- 0.50 packs/day for 60 years  . Types: Cigarettes  Smokeless tobacco  . Never Used    History  Alcohol Use No    Family History  Problem Relation Age of Onset  . Heart attack Father 5    Review of Systems: The review of systems is per the HPI.  All other systems were reviewed and are negative.  Physical Exam: BP 160/54  Pulse 55  Ht 5\' 8"  (1.727 m)  Wt 169 lb 12.8 oz (77.021 kg)  BMI 25.82 kg/m2  SpO2 98% Patient appears chronically ill but is plleasant and in  no acute distress. Skin is warm and dry. Color is normal.  She is ecchymotic on her arms. HEENT is unremarkable. Normocephalic/atraumatic. PERRL. Sclera are nonicteric. Neck is supple. No masses. No JVD. Lungs are clear. Cardiac exam shows a regular rate and rhythm. Harsh outflow murmur noted. Abdomen is soft. Left extremity is without edema. Prosthesis on the right.   She is in a wheelchair. ROM are intact. No gross neurologic deficits noted.  LABORATORY DATA:  From [facility] 08/24/13: BUN-64, creatinine-3.88. Glucose 111. Other chemistries are normal. Hgb 11.1, platelets 237K.  Assessment / Plan: 1. Coronary disease status post CABG in 1999. She remains asymptomatic. Continue risk factor modification. Current antianginal therapy includes aspirin and carvedilol.  2. PAD. Status post right AKA. Status post stenting of the left iliac with a covered stent. Continue ASA 81 mg daily.  History of intolerance to Plavix.  3. Tobacco dependence. I have again encouraged patient to stop smoking.  4. Hypertension with hypertensive heart disease - BP control acceptable. Will stay off clonidine and continue higher dose of hydralazine.  5. Chronic kidney disease stage IV. Followed by nephrology.  6. Acute diastolic CHF- volume status improved. Continue current diuretic Rx.

## 2013-12-28 ENCOUNTER — Ambulatory Visit: Payer: Medicare Other | Admitting: Cardiology

## 2014-01-01 ENCOUNTER — Encounter (HOSPITAL_COMMUNITY): Payer: Self-pay | Admitting: Emergency Medicine

## 2014-01-01 ENCOUNTER — Inpatient Hospital Stay (HOSPITAL_COMMUNITY)
Admission: EM | Admit: 2014-01-01 | Discharge: 2014-01-19 | DRG: 853 | Disposition: A | Discharge status: Skilled Nursing Facility | Payer: Medicare Other | Attending: Internal Medicine | Admitting: Internal Medicine

## 2014-01-01 ENCOUNTER — Inpatient Hospital Stay (HOSPITAL_COMMUNITY): Payer: Medicare Other

## 2014-01-01 ENCOUNTER — Emergency Department (HOSPITAL_COMMUNITY): Payer: Medicare Other

## 2014-01-01 DIAGNOSIS — G929 Unspecified toxic encephalopathy: Secondary | ICD-10-CM | POA: Diagnosis present

## 2014-01-01 DIAGNOSIS — R609 Edema, unspecified: Secondary | ICD-10-CM

## 2014-01-01 DIAGNOSIS — A419 Sepsis, unspecified organism: Secondary | ICD-10-CM | POA: Diagnosis present

## 2014-01-01 DIAGNOSIS — Z66 Do not resuscitate: Secondary | ICD-10-CM | POA: Diagnosis not present

## 2014-01-01 DIAGNOSIS — F329 Major depressive disorder, single episode, unspecified: Secondary | ICD-10-CM | POA: Diagnosis present

## 2014-01-01 DIAGNOSIS — T398X5A Adverse effect of other nonopioid analgesics and antipyretics, not elsewhere classified, initial encounter: Secondary | ICD-10-CM | POA: Diagnosis not present

## 2014-01-01 DIAGNOSIS — E872 Acidosis, unspecified: Secondary | ICD-10-CM | POA: Diagnosis present

## 2014-01-01 DIAGNOSIS — S41109A Unspecified open wound of unspecified upper arm, initial encounter: Secondary | ICD-10-CM

## 2014-01-01 DIAGNOSIS — I5032 Chronic diastolic (congestive) heart failure: Secondary | ICD-10-CM | POA: Diagnosis present

## 2014-01-01 DIAGNOSIS — I252 Old myocardial infarction: Secondary | ICD-10-CM

## 2014-01-01 DIAGNOSIS — Q819 Epidermolysis bullosa, unspecified: Secondary | ICD-10-CM

## 2014-01-01 DIAGNOSIS — E1151 Type 2 diabetes mellitus with diabetic peripheral angiopathy without gangrene: Secondary | ICD-10-CM

## 2014-01-01 DIAGNOSIS — N390 Urinary tract infection, site not specified: Secondary | ICD-10-CM

## 2014-01-01 DIAGNOSIS — I2510 Atherosclerotic heart disease of native coronary artery without angina pectoris: Secondary | ICD-10-CM | POA: Diagnosis present

## 2014-01-01 DIAGNOSIS — G92 Toxic encephalopathy: Secondary | ICD-10-CM | POA: Diagnosis present

## 2014-01-01 DIAGNOSIS — T3995XA Adverse effect of unspecified nonopioid analgesic, antipyretic and antirheumatic, initial encounter: Secondary | ICD-10-CM

## 2014-01-01 DIAGNOSIS — Z8249 Family history of ischemic heart disease and other diseases of the circulatory system: Secondary | ICD-10-CM

## 2014-01-01 DIAGNOSIS — F17200 Nicotine dependence, unspecified, uncomplicated: Secondary | ICD-10-CM | POA: Diagnosis present

## 2014-01-01 DIAGNOSIS — Z515 Encounter for palliative care: Secondary | ICD-10-CM

## 2014-01-01 DIAGNOSIS — L0390 Cellulitis, unspecified: Secondary | ICD-10-CM | POA: Diagnosis present

## 2014-01-01 DIAGNOSIS — R627 Adult failure to thrive: Secondary | ICD-10-CM | POA: Diagnosis present

## 2014-01-01 DIAGNOSIS — I739 Peripheral vascular disease, unspecified: Secondary | ICD-10-CM

## 2014-01-01 DIAGNOSIS — F3289 Other specified depressive episodes: Secondary | ICD-10-CM | POA: Diagnosis present

## 2014-01-01 DIAGNOSIS — E875 Hyperkalemia: Secondary | ICD-10-CM | POA: Diagnosis present

## 2014-01-01 DIAGNOSIS — A409 Streptococcal sepsis, unspecified: Principal | ICD-10-CM | POA: Diagnosis present

## 2014-01-01 DIAGNOSIS — S78119A Complete traumatic amputation at level between unspecified hip and knee, initial encounter: Secondary | ICD-10-CM

## 2014-01-01 DIAGNOSIS — E1159 Type 2 diabetes mellitus with other circulatory complications: Secondary | ICD-10-CM

## 2014-01-01 DIAGNOSIS — N184 Chronic kidney disease, stage 4 (severe): Secondary | ICD-10-CM | POA: Diagnosis present

## 2014-01-01 DIAGNOSIS — R6520 Severe sepsis without septic shock: Secondary | ICD-10-CM

## 2014-01-01 DIAGNOSIS — E46 Unspecified protein-calorie malnutrition: Secondary | ICD-10-CM

## 2014-01-01 DIAGNOSIS — Q828 Other specified congenital malformations of skin: Secondary | ICD-10-CM

## 2014-01-01 DIAGNOSIS — I119 Hypertensive heart disease without heart failure: Secondary | ICD-10-CM | POA: Diagnosis present

## 2014-01-01 DIAGNOSIS — IMO0002 Reserved for concepts with insufficient information to code with codable children: Secondary | ICD-10-CM | POA: Diagnosis present

## 2014-01-01 DIAGNOSIS — I798 Other disorders of arteries, arterioles and capillaries in diseases classified elsewhere: Secondary | ICD-10-CM

## 2014-01-01 DIAGNOSIS — Z7982 Long term (current) use of aspirin: Secondary | ICD-10-CM

## 2014-01-01 DIAGNOSIS — I130 Hypertensive heart and chronic kidney disease with heart failure and stage 1 through stage 4 chronic kidney disease, or unspecified chronic kidney disease: Secondary | ICD-10-CM | POA: Diagnosis present

## 2014-01-01 DIAGNOSIS — B958 Unspecified staphylococcus as the cause of diseases classified elsewhere: Secondary | ICD-10-CM | POA: Diagnosis present

## 2014-01-01 DIAGNOSIS — L8990 Pressure ulcer of unspecified site, unspecified stage: Secondary | ICD-10-CM | POA: Diagnosis present

## 2014-01-01 DIAGNOSIS — I509 Heart failure, unspecified: Secondary | ICD-10-CM | POA: Diagnosis present

## 2014-01-01 DIAGNOSIS — B955 Unspecified streptococcus as the cause of diseases classified elsewhere: Secondary | ICD-10-CM | POA: Diagnosis present

## 2014-01-01 DIAGNOSIS — E785 Hyperlipidemia, unspecified: Secondary | ICD-10-CM | POA: Diagnosis present

## 2014-01-01 DIAGNOSIS — Z791 Long term (current) use of non-steroidal anti-inflammatories (NSAID): Secondary | ICD-10-CM

## 2014-01-01 DIAGNOSIS — K5900 Constipation, unspecified: Secondary | ICD-10-CM | POA: Diagnosis present

## 2014-01-01 DIAGNOSIS — L03114 Cellulitis of left upper limb: Secondary | ICD-10-CM

## 2014-01-01 DIAGNOSIS — S41102S Unspecified open wound of left upper arm, sequela: Secondary | ICD-10-CM

## 2014-01-01 DIAGNOSIS — Z951 Presence of aortocoronary bypass graft: Secondary | ICD-10-CM

## 2014-01-01 DIAGNOSIS — L89109 Pressure ulcer of unspecified part of back, unspecified stage: Secondary | ICD-10-CM | POA: Diagnosis present

## 2014-01-01 DIAGNOSIS — S88119A Complete traumatic amputation at level between knee and ankle, unspecified lower leg, initial encounter: Secondary | ICD-10-CM

## 2014-01-01 DIAGNOSIS — N189 Chronic kidney disease, unspecified: Secondary | ICD-10-CM | POA: Diagnosis present

## 2014-01-01 DIAGNOSIS — A400 Sepsis due to streptococcus, group A: Secondary | ICD-10-CM

## 2014-01-01 DIAGNOSIS — F411 Generalized anxiety disorder: Secondary | ICD-10-CM | POA: Diagnosis present

## 2014-01-01 DIAGNOSIS — S37009A Unspecified injury of unspecified kidney, initial encounter: Secondary | ICD-10-CM

## 2014-01-01 DIAGNOSIS — D62 Acute posthemorrhagic anemia: Secondary | ICD-10-CM | POA: Diagnosis not present

## 2014-01-01 DIAGNOSIS — N12 Tubulo-interstitial nephritis, not specified as acute or chronic: Secondary | ICD-10-CM | POA: Diagnosis present

## 2014-01-01 DIAGNOSIS — D61818 Other pancytopenia: Secondary | ICD-10-CM | POA: Diagnosis present

## 2014-01-01 DIAGNOSIS — D638 Anemia in other chronic diseases classified elsewhere: Secondary | ICD-10-CM | POA: Diagnosis present

## 2014-01-01 DIAGNOSIS — F1920 Other psychoactive substance dependence, uncomplicated: Secondary | ICD-10-CM | POA: Diagnosis present

## 2014-01-01 DIAGNOSIS — M729 Fibroblastic disorder, unspecified: Secondary | ICD-10-CM | POA: Diagnosis present

## 2014-01-01 DIAGNOSIS — N179 Acute kidney failure, unspecified: Secondary | ICD-10-CM | POA: Diagnosis present

## 2014-01-01 DIAGNOSIS — R7881 Bacteremia: Secondary | ICD-10-CM

## 2014-01-01 LAB — CBC WITH DIFFERENTIAL/PLATELET
BASOS PCT: 0 % (ref 0–1)
Basophils Absolute: 0 10*3/uL (ref 0.0–0.1)
Eosinophils Absolute: 0 10*3/uL (ref 0.0–0.7)
Eosinophils Relative: 1 % (ref 0–5)
HEMATOCRIT: 32 % — AB (ref 36.0–46.0)
HEMOGLOBIN: 10.3 g/dL — AB (ref 12.0–15.0)
LYMPHS PCT: 10 % — AB (ref 12–46)
Lymphs Abs: 0.3 10*3/uL — ABNORMAL LOW (ref 0.7–4.0)
MCH: 31.2 pg (ref 26.0–34.0)
MCHC: 32.2 g/dL (ref 30.0–36.0)
MCV: 97 fL (ref 78.0–100.0)
MONOS PCT: 9 % (ref 3–12)
Monocytes Absolute: 0.3 10*3/uL (ref 0.1–1.0)
Neutro Abs: 2.4 10*3/uL (ref 1.7–7.7)
Neutrophils Relative %: 80 % — ABNORMAL HIGH (ref 43–77)
Platelets: 145 10*3/uL — ABNORMAL LOW (ref 150–400)
RBC: 3.3 MIL/uL — ABNORMAL LOW (ref 3.87–5.11)
RDW: 13.4 % (ref 11.5–15.5)
WBC Morphology: INCREASED
WBC: 3 10*3/uL — AB (ref 4.0–10.5)

## 2014-01-01 LAB — COMPREHENSIVE METABOLIC PANEL
ALBUMIN: 2.6 g/dL — AB (ref 3.5–5.2)
ALK PHOS: 42 U/L (ref 39–117)
ALT: 12 U/L (ref 0–35)
AST: 34 U/L (ref 0–37)
BUN: 53 mg/dL — ABNORMAL HIGH (ref 6–23)
CHLORIDE: 103 meq/L (ref 96–112)
CO2: 19 mEq/L (ref 19–32)
Calcium: 8.9 mg/dL (ref 8.4–10.5)
Creatinine, Ser: 3.96 mg/dL — ABNORMAL HIGH (ref 0.50–1.10)
GFR calc non Af Amer: 10 mL/min — ABNORMAL LOW (ref >90)
GFR, EST AFRICAN AMERICAN: 12 mL/min — AB (ref >90)
GLUCOSE: 97 mg/dL (ref 70–99)
POTASSIUM: 5 meq/L (ref 3.7–5.3)
Sodium: 138 mEq/L (ref 137–147)
Total Bilirubin: 0.4 mg/dL (ref 0.3–1.2)
Total Protein: 6.3 g/dL (ref 6.0–8.3)

## 2014-01-01 LAB — I-STAT CHEM 8, ED
BUN: 47 mg/dL — ABNORMAL HIGH (ref 6–23)
CALCIUM ION: 1.18 mmol/L (ref 1.13–1.30)
CREATININE: 4.5 mg/dL — AB (ref 0.50–1.10)
Chloride: 106 mEq/L (ref 96–112)
Glucose, Bld: 96 mg/dL (ref 70–99)
HCT: 34 % — ABNORMAL LOW (ref 36.0–46.0)
Hemoglobin: 11.6 g/dL — ABNORMAL LOW (ref 12.0–15.0)
Potassium: 4.7 mEq/L (ref 3.7–5.3)
Sodium: 138 mEq/L (ref 137–147)
TCO2: 18 mmol/L (ref 0–100)

## 2014-01-01 LAB — CREATININE, SERUM
CREATININE: 4.11 mg/dL — AB (ref 0.50–1.10)
GFR calc Af Amer: 11 mL/min — ABNORMAL LOW (ref >90)
GFR calc non Af Amer: 10 mL/min — ABNORMAL LOW (ref >90)

## 2014-01-01 LAB — I-STAT CG4 LACTIC ACID, ED: Lactic Acid, Venous: 1.56 mmol/L (ref 0.5–2.2)

## 2014-01-01 LAB — CBC
HEMATOCRIT: 30.6 % — AB (ref 36.0–46.0)
Hemoglobin: 9.7 g/dL — ABNORMAL LOW (ref 12.0–15.0)
MCH: 30.6 pg (ref 26.0–34.0)
MCHC: 31.7 g/dL (ref 30.0–36.0)
MCV: 96.5 fL (ref 78.0–100.0)
Platelets: 158 10*3/uL (ref 150–400)
RBC: 3.17 MIL/uL — AB (ref 3.87–5.11)
RDW: 13.4 % (ref 11.5–15.5)
WBC: 2.4 10*3/uL — ABNORMAL LOW (ref 4.0–10.5)

## 2014-01-01 LAB — URINE MICROSCOPIC-ADD ON

## 2014-01-01 LAB — I-STAT TROPONIN, ED: TROPONIN I, POC: 0.15 ng/mL — AB (ref 0.00–0.08)

## 2014-01-01 LAB — URINALYSIS, ROUTINE W REFLEX MICROSCOPIC
Bilirubin Urine: NEGATIVE
GLUCOSE, UA: NEGATIVE mg/dL
Ketones, ur: NEGATIVE mg/dL
Nitrite: POSITIVE — AB
Protein, ur: >300 mg/dL — AB
Specific Gravity, Urine: 1.018 (ref 1.005–1.030)
Urobilinogen, UA: 0.2 mg/dL (ref 0.0–1.0)
pH: 6 (ref 5.0–8.0)

## 2014-01-01 LAB — TSH: TSH: 0.9 u[IU]/mL (ref 0.350–4.500)

## 2014-01-01 LAB — GLUCOSE, CAPILLARY: Glucose-Capillary: 106 mg/dL — ABNORMAL HIGH (ref 70–99)

## 2014-01-01 LAB — MRSA PCR SCREENING: MRSA by PCR: NEGATIVE

## 2014-01-01 LAB — LACTIC ACID, PLASMA: LACTIC ACID, VENOUS: 2.2 mmol/L (ref 0.5–2.2)

## 2014-01-01 MED ORDER — SODIUM CHLORIDE 0.9 % IV BOLUS (SEPSIS)
1000.0000 mL | Freq: Once | INTRAVENOUS | Status: AC
  Administered 2014-01-01: 1000 mL via INTRAVENOUS

## 2014-01-01 MED ORDER — PIPERACILLIN-TAZOBACTAM 3.375 G IVPB 30 MIN: 3.3750 g | Freq: Once | INTRAVENOUS | Status: DC

## 2014-01-01 MED ORDER — VANCOMYCIN HCL IN DEXTROSE 1-5 GM/200ML-% IV SOLN: 1000.0000 mg | INTRAVENOUS | Status: DC

## 2014-01-01 MED ORDER — ASPIRIN EC 81 MG PO TBEC
81.0000 mg | DELAYED_RELEASE_TABLET | Freq: Every day | ORAL | Status: DC
  Administered 2014-01-02 – 2014-01-10 (×9): 81 mg via ORAL
  Filled 2014-01-01 (×10): qty 1

## 2014-01-01 MED ORDER — INSULIN ASPART 100 UNIT/ML ~~LOC~~ SOLN
0.0000 [IU] | Freq: Three times a day (TID) | SUBCUTANEOUS | Status: DC
  Administered 2014-01-04 – 2014-01-05 (×2): 2 [IU] via SUBCUTANEOUS

## 2014-01-01 MED ORDER — MORPHINE SULFATE 4 MG/ML IJ SOLN
4.0000 mg | Freq: Once | INTRAMUSCULAR | Status: AC
  Administered 2014-01-01: 4 mg via INTRAVENOUS
  Filled 2014-01-01: qty 1

## 2014-01-01 MED ORDER — HYDRALAZINE HCL 50 MG PO TABS
75.0000 mg | ORAL_TABLET | Freq: Three times a day (TID) | ORAL | Status: DC
  Administered 2014-01-01 – 2014-01-06 (×14): 75 mg via ORAL
  Filled 2014-01-01 (×16): qty 1

## 2014-01-01 MED ORDER — ONDANSETRON HCL 4 MG/2ML IJ SOLN
4.0000 mg | Freq: Four times a day (QID) | INTRAMUSCULAR | Status: DC | PRN
  Administered 2014-01-10 – 2014-01-18 (×2): 4 mg via INTRAVENOUS
  Filled 2014-01-01 (×2): qty 2

## 2014-01-01 MED ORDER — HYDROMORPHONE HCL PF 1 MG/ML IJ SOLN
2.0000 mg | INTRAMUSCULAR | Status: DC | PRN
  Administered 2014-01-01 – 2014-01-02 (×3): 2 mg via INTRAVENOUS
  Filled 2014-01-01 (×4): qty 2

## 2014-01-01 MED ORDER — ALUM & MAG HYDROXIDE-SIMETH 200-200-20 MG/5ML PO SUSP: 30.0000 mL | Freq: Four times a day (QID) | ORAL | Status: DC | PRN

## 2014-01-01 MED ORDER — INSULIN ASPART 100 UNIT/ML ~~LOC~~ SOLN: 0.0000 [IU] | Freq: Every day | SUBCUTANEOUS | Status: DC

## 2014-01-01 MED ORDER — ACETAMINOPHEN 325 MG PO TABS
650.0000 mg | ORAL_TABLET | Freq: Four times a day (QID) | ORAL | Status: DC | PRN
  Administered 2014-01-02 – 2014-01-13 (×2): 650 mg via ORAL
  Filled 2014-01-01 (×2): qty 2

## 2014-01-01 MED ORDER — DEXTROSE 5 % IV SOLN
1.0000 g | Freq: Once | INTRAVENOUS | Status: AC
  Administered 2014-01-01: 1 g via INTRAVENOUS
  Filled 2014-01-01: qty 10

## 2014-01-01 MED ORDER — PROPRANOLOL HCL 40 MG PO TABS
40.0000 mg | ORAL_TABLET | Freq: Two times a day (BID) | ORAL | Status: DC | PRN
  Administered 2014-01-01 – 2014-01-07 (×4): 40 mg via ORAL
  Filled 2014-01-01 (×5): qty 1

## 2014-01-01 MED ORDER — HYDROCODONE-ACETAMINOPHEN 5-325 MG PO TABS
1.0000 | ORAL_TABLET | Freq: Once | ORAL | Status: AC
  Administered 2014-01-01: 1 via ORAL
  Filled 2014-01-01: qty 1

## 2014-01-01 MED ORDER — HEPARIN SODIUM (PORCINE) 5000 UNIT/ML IJ SOLN
5000.0000 [IU] | Freq: Three times a day (TID) | INTRAMUSCULAR | Status: DC
  Administered 2014-01-01 – 2014-01-19 (×51): 5000 [IU] via SUBCUTANEOUS
  Filled 2014-01-01 (×56): qty 1

## 2014-01-01 MED ORDER — SODIUM CHLORIDE 0.9 % IV SOLN
INTRAVENOUS | Status: DC
  Administered 2014-01-01: 22:00:00 via INTRAVENOUS
  Administered 2014-01-02: 75 mL via INTRAVENOUS

## 2014-01-01 MED ORDER — VANCOMYCIN HCL 10 G IV SOLR
1500.0000 mg | Freq: Once | INTRAVENOUS | Status: AC
  Administered 2014-01-01: 1500 mg via INTRAVENOUS
  Filled 2014-01-01: qty 1500

## 2014-01-01 MED ORDER — PIPERACILLIN-TAZOBACTAM IN DEX 2-0.25 GM/50ML IV SOLN
2.2500 g | Freq: Four times a day (QID) | INTRAVENOUS | Status: DC
  Administered 2014-01-01 – 2014-01-04 (×11): 2.25 g via INTRAVENOUS
  Filled 2014-01-01 (×12): qty 50

## 2014-01-01 MED ORDER — BISACODYL 10 MG RE SUPP
10.0000 mg | Freq: Every day | RECTAL | Status: DC | PRN
  Administered 2014-01-08: 10 mg via RECTAL
  Filled 2014-01-01: qty 1

## 2014-01-01 MED ORDER — ONDANSETRON HCL 4 MG PO TABS
4.0000 mg | ORAL_TABLET | Freq: Four times a day (QID) | ORAL | Status: DC | PRN
  Administered 2014-01-01: 4 mg via ORAL
  Filled 2014-01-01: qty 1

## 2014-01-01 MED ORDER — ACETAMINOPHEN 325 MG PO TABS
650.0000 mg | ORAL_TABLET | Freq: Once | ORAL | Status: AC
  Administered 2014-01-01: 650 mg via ORAL
  Filled 2014-01-01: qty 2

## 2014-01-01 MED ORDER — CLONAZEPAM 1 MG PO TABS: 1.0000 mg | ORAL_TABLET | Freq: Two times a day (BID) | ORAL | Status: DC | PRN

## 2014-01-01 MED ORDER — OXYCODONE HCL 5 MG PO TABS
5.0000 mg | ORAL_TABLET | Freq: Four times a day (QID) | ORAL | Status: DC | PRN
  Administered 2014-01-01 – 2014-01-09 (×19): 5 mg via ORAL
  Filled 2014-01-01 (×23): qty 1

## 2014-01-01 MED ORDER — VANCOMYCIN HCL IN DEXTROSE 1-5 GM/200ML-% IV SOLN
1000.0000 mg | INTRAVENOUS | Status: DC
  Administered 2014-01-03: 1000 mg via INTRAVENOUS
  Filled 2014-01-01: qty 200

## 2014-01-01 MED ORDER — ACETAMINOPHEN 650 MG RE SUPP: 650.0000 mg | Freq: Four times a day (QID) | RECTAL | Status: DC | PRN

## 2014-01-01 MED ORDER — ONDANSETRON HCL 4 MG/2ML IJ SOLN
4.0000 mg | Freq: Once | INTRAMUSCULAR | Status: AC
  Administered 2014-01-01: 4 mg via INTRAVENOUS
  Filled 2014-01-01: qty 2

## 2014-01-01 MED ORDER — SODIUM CHLORIDE 0.9 % IJ SOLN
3.0000 mL | Freq: Two times a day (BID) | INTRAMUSCULAR | Status: DC
  Administered 2014-01-02 – 2014-01-03 (×2): 3 mL via INTRAVENOUS
  Administered 2014-01-04: 20:00:00 via INTRAVENOUS
  Administered 2014-01-06 – 2014-01-18 (×14): 3 mL via INTRAVENOUS

## 2014-01-01 NOTE — ED Provider Notes (Addendum)
CSN: [id number]     Arrival date & time 01/01/14  1146 History   First MD Initiated Contact with Patient 01/01/14 1147     Chief Complaint  Patient presents with  . Weakness     (Consider location/radiation/quality/duration/timing/severity/associated sxs/prior Treatment) HPI Comments: Pt presenting due to generalized weakness, malaise and fever starting over the last 12 hours.  She also states about 6pm last night she developed pain and swelling in the left forearm which has progressively worsened over the last 12 hours and is now weeping and very painful.  No prior hx of clots to that arm or recent procedures.  She did get a scrape on that arm a few days ago but did not have a problem till recently.  Pt today has had a fever and not feelign well.  Denies dysuria, abd pain, SOB, cough or other complaints.  Family states having fantom limb pain yesterday and PCP called her in flexeril and she has taken 4 in the last 12 hours making her a little confused.  The history is provided by the patient.    Past Medical History  Diagnosis Date  . Hypertension   . Hyperlipidemia   . Coronary artery disease     post CABG in 1999, with 2-vessel disease -- - LIMA graft to the LAD and a left radial artery graft to the PDA   . Old inferior wall myocardial infarction 1999  . 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 Tobacco dependency   . CKD (chronic kidney disease) stage 4, GFR 15-29 ml/min     followed by Dr. Arrie Aranoladonato  . Diastolic heart failure   . Aortic stenosis     Mild  . Type 2 diabetes mellitus with vascular disease   . CKD (chronic kidney disease), stage IV   . Hypertensive heart disease   . Anxiety   . Depression   . Cataract   . Ovarian cyst    Past Surgical History  Procedure Laterality Date  . Partial hysterectomy    . Total vaginal hysterectomy    . 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 Cardiac catheterization    . Iliac vein angioplasty / stenting   08/10/2010     Left common iliac stenting  . Coronary artery bypass graft  01/26/1998    Est. EF of 40-45% -- with 2-vessel disease -- LIMA graft to the LAD and a left radial artery graft to the PDA    Family History  Problem Relation Age of Onset  . Heart attack Father [id number]   History  Substance Use Topics  . Smoking status: Current Every Day Smoker -- 0.50 packs/day for 60 years    Types: Cigarettes  . Smokeless tobacco: Never Used  . Alcohol Use: No   OB History   Grav Para Term Preterm Abortions TAB SAB Ect Mult Living                 Review of Systems  Constitutional: Positive for fever, chills and appetite change.  Respiratory: Negative for shortness of breath.        Chronic cough that is unchanged from baseline  Gastrointestinal: Negative for nausea, vomiting, abdominal pain and diarrhea.  Musculoskeletal:       Left forearm, hand pain and weeping  Neurological: Positive for weakness.  All other systems reviewed and are negative.     Allergies  Review of patient's allergies indicates no known allergies.  Home Medications   Prior to Admission medications   Medication Sig Start Date End Date Taking? Authorizing Provider  aspirin EC 81 MG EC tablet Take 1 tablet (81 mg total) by mouth daily. 08/03/13   Brittainy Simmons, PA-C  calcitRIOL (ROCALTROL) 0.25 MCG capsule Take 0.25 mcg by mouth daily.    Historical Provider, MD  Cyanocobalamin (B-12 PO) Take 1 tablet by mouth daily.    Historical Provider, MD  ergocalciferol (VITAMIN D2) 50000 UNITS capsule Take 50,000 Units by mouth every 21 ( twenty-one) days.     Historical Provider, MD  furosemide (LASIX) 40 MG tablet Take 1 tablet (40 mg total) by mouth 2 (two) times daily. 10/22/12   Peter M SwazilandJordan, MD  hydrALAZINE (APRESOLINE) 50 MG tablet Take 1.5 tablets (75 mg total) by mouth 3 (three) times daily. 08/03/13   Brittainy Simmons, PA-C  HYDROcodone-acetaminophen (NORCO) 7.5-325 MG per tablet Take 1 tablet by mouth every  6 (six) hours as needed. For pain    Historical Provider, MD  isosorbide mononitrate (IMDUR) 120 MG 24 hr tablet Take 1 tablet (120 mg total) by mouth daily. 08/03/13   Brittainy Simmons, PA-C  labetalol (NORMODYNE) 200 MG tablet Take 1 tablet (200 mg total) by mouth 2 (two) times daily. 08/03/13   Brittainy Simmons, PA-C  rosuvastatin (CRESTOR) 40 MG tablet Take 40 mg by mouth at bedtime.    Historical Provider, MD  Trospium Chloride 60 MG CP24  07/23/13   Historical Provider, MD   BP 158/42  Pulse 78  Resp 15  SpO2 95% Physical Exam  Nursing note and vitals reviewed. Constitutional: She is oriented to person, place, and time. She appears well-developed and well-nourished. No distress.  HENT:  Head: Normocephalic and atraumatic.  Mouth/Throat: Oropharynx is clear and moist.  Eyes: Conjunctivae and EOM are normal. Pupils are equal, round, and reactive to light.  Neck: Normal range of motion. Neck supple.  Cardiovascular: Normal rate, regular rhythm and intact distal pulses.   No murmur heard. Pulmonary/Chest: Effort normal and breath sounds normal. No respiratory distress. She has no wheezes. She has no rales.  Abdominal: Soft. She exhibits no distension. There is no tenderness. There is no rebound and no guarding.  Musculoskeletal: Normal range of motion. She exhibits no edema and no tenderness.  Right AKA.  Left forearm and hand : weeping, hot, erythematous.  2+ pulse.  Redness and weeping stop about the elbow.  No humerus or shoulder pain.  Neurological: She is alert and oriented to person, place, and time.  Skin: Skin is warm and dry. No rash noted. No erythema. There is pallor.  Psychiatric: She has a normal mood and affect. Her behavior is normal.    ED Course  Procedures (including critical care time) Labs Review Labs Reviewed  CBC WITH DIFFERENTIAL - Abnormal; Notable for the following:    WBC 3.0 (*)    RBC 3.30 (*)    Hemoglobin 10.3 (*)    HCT 32.0 (*)    Platelets 145  (*)    All other components within normal limits  COMPREHENSIVE METABOLIC PANEL - Abnormal; Notable for the following:    BUN 53 (*)    Creatinine, Ser 3.96 (*)    Albumin 2.6 (*)    GFR calc non Af Amer 10 (*)    GFR calc Af Amer 12 (*)    All other components within normal limits  URINALYSIS, ROUTINE W REFLEX MICROSCOPIC - Abnormal; Notable for the following:    APPearance  CLOUDY (*)    Hgb urine dipstick MODERATE (*)    Protein, ur >300 (*)    Nitrite POSITIVE (*)    Leukocytes, UA MODERATE (*)    All other components within normal limits  URINE MICROSCOPIC-ADD ON - Abnormal; Notable for the following:    Squamous Epithelial / LPF MANY (*)    Bacteria, UA MANY (*)    All other components within normal limits  I-STAT CHEM 8, ED - Abnormal; Notable for the following:    BUN 47 (*)    Creatinine, Ser 4.50 (*)    Hemoglobin 11.6 (*)    HCT 34.0 (*)    All other components within normal limits  I-STAT TROPOININ, ED - Abnormal; Notable for the following:    Troponin i, poc 0.15 (*)    All other components within normal limits  URINE CULTURE  CULTURE, BLOOD (ROUTINE X 2)  CULTURE, BLOOD (ROUTINE X 2)  I-STAT CG4 LACTIC ACID, ED    Imaging Review Dg Forearm Left  01/01/2014   CLINICAL DATA:  Redness and pain with swelling question with draining wounds over the left forearm  EXAM: LEFT FOREARM - 2 VIEW  COMPARISON:  None.  FINDINGS: A single AP view of the forearm reveals a numerous vascular clips throughout the forearm. There is heterogeneous density within the lateral aspect of the forearm soft tissues. No definite gas collections are demonstrated. The bones exhibit no acute abnormality.  IMPRESSION: There is no acute bony abnormality of the forearm. There is heterogeneous soft tissue density which may reflect cellulitis.   Electronically Signed   By: David  [place]   On: 01/01/2014 12:29   Dg Chest Port 1 View  01/01/2014   CLINICAL DATA:  Shortness of breath.  EXAM: PORTABLE  CHEST - 1 VIEW  COMPARISON:  Chest x-ray 07/28/2013.  FINDINGS: Prior CABG. Cardiomegaly and pulmonary vascular prominence and bilateral interstitial prominence. No pleural effusion or pneumothorax. Pulmonary interstitial prominence has improved from prior study. No acute bony abnormality.  IMPRESSION: Improving congestive heart failure and interstitial edema .   Electronically Signed   By: Maisie Fus  Register   On: 01/01/2014 12:29     Date: 01/01/2014  Rate: 95  Rhythm: normal sinus rhythm  QRS Axis: normal  Intervals: normal  ST/T Wave abnormalities: nonspecific ST/T changes  Conduction Disutrbances:right bundle branch block  Narrative Interpretation:   Old EKG Reviewed: unchanged    MDM   Final diagnoses:  UTI (lower urinary tract infection)  Cellulitis of left upper extremity    Patient coming from home with new fever, left-sided arm pain and swelling in the last 12 hours. She denies any bites or trauma. She states she did scrape her arm a few days ago but did not have any issues until last night around 6 PM. Family states her regular doctor called her in flexeril for phantom limb pain and she has taken 4 in the last 12 hours so she was a bit confused this morning.  Here she was awake and lucid and able to answer questions appropriately. She denies any shortness of breath, productive cough or abdominal pain. She denies any urinary symptoms.  Concerning the patient's arm is the source of her fever of 101.2. This the skin and drainage without palpable crepitus. Plain film shows signs of cellulitis but no pockets of air. Currently patient is hemodynamically stable.  CBC, CMP, lactate, UA, chest x-ray, blood cultures, urine cultures pending.  Patient given pain control and fluids.  Chest  x-ray showing improving CHF and interstitial edema  2:59 PM Pt with evidence of acute on chronic renal failure with new UTI and cellulitis of the left forearm.  Will treat with vanc and rocpehin.  Will  admit for further care.  Normal lactate and CBC. General surgery also consulted to evaluate the pt for possible early nec fascitis and CT ordered  Gwyneth Sprout, MD 01/01/14 1500  Gwyneth Sprout, MD 01/01/14 1502  Gwyneth Sprout, MD 01/01/14 1511  Gwyneth Sprout, MD 01/01/14 1637

## 2014-01-01 NOTE — H&P (Signed)
[facility] History and Physical  Tricia BeathMargie E Strausser WUJ:[id number] DOB: December 29, 1933 DOA: 01/01/2014  Referring physician:  PCP: Kaleen MaskELKINS,WILSON OLIVER, MD   Chief Complaint: Generalized Weakness/Left Arm Pain  HPI: Tricia [name] is a 80 y.o. female with a history of multiple comorbidities including type 2 diabetes mellitus, peripheral vascular disease, status post right above-the-knee amputation, stage IV chronic kidney disease, chronic diastolic congestive heart failure, presenting to the emergency room with complaints of generalized weakness, feeling poorly and left upper extremity swelling. She reports symptoms started approximately 24 hours ago, having increased left upper extremity pain that is associated with swelling and erythema. she characterizes her arm pain as 10 out of 10, progressively worsening in the last 12 hours. She also has noted persistent weeping from the affected arm. She denies recent trauma, falls, or injuries to the affected extremity. She complains of associated weakness, functional decline, fevers, chills, malaise, anorexia, overall doing poorly in the last 12 hours. In the emergency room she had lab work which showed a white count of 3000, presence of bands, temperature of 101.1, with development of acute on chronic renal failure. CT scan of the left arm ordered, results currently pending.                                                                                                                                                                         Review of Systems:  Constitutional:  No weight loss, night sweats, positive for fevers, chills, and fatigue.  HEENT:  No headaches, Difficulty swallowing,Tooth/dental problems,Sore throat,  No sneezing, itching, ear ache, nasal congestion, post nasal drip,  Cardio-vascular:  No chest pain, Orthopnea, PND, swelling in lower extremities, anasarca, dizziness, palpitations  GI:  No heartburn, indigestion, abdominal  pain, nausea, vomiting, diarrhea, change in bowel habits, loss of appetite  Resp:  No shortness of breath with exertion or at rest. No excess mucus, no productive cough, No non-productive cough, No coughing up of blood.No change in color of mucus.No wheezing.No chest wall deformity  Skin:  Positive for left upper extremity erythema.  GU:  no dysuria, change in color of urine, no urgency or frequency. No flank pain.  Musculoskeletal:  Positive for pain and swelling of left upper extremity  Psych:  No change in mood or affect. No depression or anxiety. No memory loss.   Past Medical History  Diagnosis Date  . Hypertension   . Hyperlipidemia   . Coronary artery disease     post CABG in 1999, with 2-vessel disease -- - LIMA graft to the LAD and a left radial artery graft to the PDA   . Old inferior wall myocardial infarction 1999  . Peripheral vascular disease     post AKA on  right  . Tobacco dependency   . CKD (chronic kidney disease) stage 4, GFR 15-29 ml/min     followed by Dr. Arrie Aranoladonato  . Diastolic heart failure   . Aortic stenosis     Mild  . Type 2 diabetes mellitus with vascular disease   . CKD (chronic kidney disease), stage IV   . Hypertensive heart disease   . Anxiety   . Depression   . Cataract   . Ovarian cyst    Past Surgical History  Procedure Laterality Date  . Partial hysterectomy    . Total vaginal hysterectomy    . 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 Cardiac catheterization    . Iliac vein angioplasty / stenting  08/10/2010     Left common iliac stenting  . Coronary artery bypass graft  01/26/1998    Est. EF of 40-45% -- with 2-vessel disease -- LIMA graft to the LAD and a left radial artery graft to the PDA    Social History:  reports that she has been smoking Cigarettes.  She has a 30 pack-year smoking history. She has never used smokeless tobacco. She reports that she does not drink alcohol or use illicit drugs.  No Known Allergies  Family  History  Problem Relation Age of Onset  . Heart attack Father 937     Prior to Admission medications   Medication Sig Start Date End Date Taking? Authorizing Novice Vrba  aspirin EC 81 MG EC tablet Take 1 tablet (81 mg total) by mouth daily. 08/03/13  Yes Brittainy Simmons, PA-C  calcitRIOL (ROCALTROL) 0.25 MCG capsule Take 0.25 mcg by mouth 3 (three) times a week. On Monday, Wednesday, and Friday   Yes Historical Ericca Labra, MD  clonazePAM (KLONOPIN) 2 MG tablet Take 2 mg by mouth 2 (two) times daily.   Yes Historical Crissa Sowder, MD  furosemide (LASIX) 40 MG tablet Take 1 tablet (40 mg total) by mouth 2 (two) times daily. 10/22/12  Yes Peter M [place]Jordan, MD  hydrALAZINE (APRESOLINE) 50 MG tablet Take 1.5 tablets (75 mg total) by mouth 3 (three) times daily. 08/03/13  Yes Brittainy Simmons, PA-C  labetalol (NORMODYNE) 200 MG tablet Take 1 tablet (200 mg total) by mouth 2 (two) times daily. 08/03/13  Yes Brittainy Sharol HarnessSimmons, PA-C  propranolol (INDERAL) 80 MG tablet Take 40 mg by mouth 2 (two) times daily as needed (for tremor / hypertension).   Yes Historical Jaymin Waln, MD  Trospium Chloride 60 MG CP24 Take 60 mg by mouth daily.  07/23/13  Yes Historical Oris Calmes, MD  vitamin B-12 (CYANOCOBALAMIN) 50 MCG tablet Take 50 mcg by mouth daily.   Yes Historical Oneka Parada, MD   Physical Exam: Filed Vitals:   01/01/14 1544  BP:   Pulse:   Temp: 99.4 F (37.4 C)  Resp:     BP 152/52  Pulse 93  Temp(Src) 99.4 F (37.4 C) (Oral)  Resp 18  SpO2 93%  General:  Ill appearing, mildly somnolent however is arousable and can provide history Eyes: PERRL, normal lids, irises & conjunctiva ENT: grossly normal hearing, lips & tongue Neck: no LAD, masses or thyromegaly Cardiovascular: RRR, 2/6 systolic ejection murmur. No LE edema 2 left lower extremity Telemetry: SR, no arrhythmias  Respiratory: CTA bilaterally, no w/r/r. Normal respiratory effort. Abdomen: soft, ntnd Skin: Circumferential erythema involving left upper  extremity Musculoskeletal: There is an significant swelling and erythema in circumferential pattern involving left upper extremity, extending from had to just above her elbow. Her  fingertips were warm, there was no alteration to sensation over her hand. Psychiatric: grossly normal mood and affect, speech fluent and appropriate Neurologic: grossly non-focal.          Labs on Admission:  Basic Metabolic Panel:  Recent Labs Lab 01/01/14 1339 01/01/14 1416  NA 138 138  K 5.0 4.7  CL 103 106  CO2 19  --   GLUCOSE 97 96  BUN 53* 47*  CREATININE 3.96* 4.50*  CALCIUM 8.9  --    Liver Function Tests:  Recent Labs Lab 01/01/14 1339  AST 34  ALT 12  ALKPHOS 42  BILITOT 0.4  PROT 6.3  ALBUMIN 2.6*   No results found for this basename: LIPASE, AMYLASE,  in the last 168 hours No results found for this basename: AMMONIA,  in the last 168 hours CBC:  Recent Labs Lab 01/01/14 1339 01/01/14 1416  WBC 3.0*  --   NEUTROABS 2.4  --   HGB 10.3* 11.6*  HCT 32.0* 34.0*  MCV 97.0  --   PLT 145*  --    Cardiac Enzymes: No results found for this basename: CKTOTAL, CKMB, CKMBINDEX, TROPONINI,  in the last 168 hours  BNP (last 3 results)  Recent Labs  07/28/13 1650  PROBNP 32203.0*   CBG: No results found for this basename: GLUCAP,  in the last 168 hours  Radiological Exams on Admission: Dg Forearm Left  01/01/2014   CLINICAL DATA:  Redness and pain with swelling question with draining wounds over the left forearm  EXAM: LEFT FOREARM - 2 VIEW  COMPARISON:  None.  FINDINGS: A single AP view of the forearm reveals a numerous vascular clips throughout the forearm. There is heterogeneous density within the lateral aspect of the forearm soft tissues. No definite gas collections are demonstrated. The bones exhibit no acute abnormality.  IMPRESSION: There is no acute bony abnormality of the forearm. There is heterogeneous soft tissue density which may reflect cellulitis.   Electronically  Signed   By: David  [place]   On: 01/01/2014 12:29   Dg Chest Port 1 View  01/01/2014   CLINICAL DATA:  Shortness of breath.  EXAM: PORTABLE CHEST - 1 VIEW  COMPARISON:  Chest x-ray 07/28/2013.  FINDINGS: Prior CABG. Cardiomegaly and pulmonary vascular prominence and bilateral interstitial prominence. No pleural effusion or pneumothorax. Pulmonary interstitial prominence has improved from prior study. No acute bony abnormality.  IMPRESSION: Improving congestive heart failure and interstitial edema .   Electronically Signed   By: Maisie Fus  Register   On: 01/01/2014 12:29    EKG: Independently reviewed.   Assessment/Plan Principal Problem:   Cellulitis Active Problems:   Sepsis   Acute on chronic renal failure   Diabetes mellitus with peripheral vascular disease   Hypertensive heart disease   Tobacco dependency   Chronic diastolic CHF (congestive heart failure)   1. Sepsis, present on admission. Evidence by leukopenia with white count of 3000, presence of bands, temperature of 101.1, respiratory rate of 27, encephalopathy and presence of acute on chronic renal failure. Source of infection likely to be left upper extremity cellulitis. Blood cultures have been obtained in the emergency room. She was administered ceftriaxone and vancomycin. Will provide IV fluid resuscitation, continue empiric broad-spectrum IV antimicrobial therapy with Vancomycin and Zosyn, repeat lactate level this evening.  Admit patient to step down unit for close monitoring. 2. Left upper extremity cellulitis. Patient presenting with sepsis criteria. She has significant pain to her left upper extremity on my evaluation. I  am concerned with the possibility of fasciitis, given relatively rapid progression, significant tenderness on exam, history of diabetes and diabetes related complications, and having sepsis criteria. Discussed concerns with ED MD will obtain a surgical consultation. Will further evaluate with CT scan.  3. Acute  on chronic renal failure. Patient with history of stage IV chronic kidney disease, having a baseline creatinine near 3.4. Initial lab work in the emergency room showing creatinine of 4.5 with BUN of 47. Likely secondary to sepsis. Also appears to have been on diuretic therapy as an outpatient. Will stop diuretics, provide IV fluids. 4. Urinary tract infection. Urinalysis showing the presence of many bacteria with moderate leukocytes and nitrates. She is on broad-spectrum IV antimicrobial therapy for left upper extremity cellulitis. Will followup on urine cultures 5. Type 2 diabetes mellitus. Will perform Accu-Cheks q. a.c. each bedtime with sliding scale coverage 6. Chronic diastolic congestive heart failure. Currently compensated. Diuretic therapy will BE held as patient presents septic with acute on chronic renal failure. Will receive IV fluid resuscitation.  7. DVT prophylaxis. Subcutaneous heparin    Code Status: Full  Family Communication:  Disposition Plan: Will admit patient to step down unit, and his facial require greater than 2 nights hospitalization  Time spent: 70 min  Jeralyn Bennett [facility] Pager [phone]  **Disclaimer: This note may have been dictated with voice recognition software. Similar sounding words can inadvertently be transcribed and this note may contain transcription errors which may not have been corrected upon publication of note.**

## 2014-01-01 NOTE — ED Notes (Addendum)
Pt arrives per ems w/ c/o of fall from her recliner, ( pt usually able to get from recliner to W/c  By herself but did not make it this timeweakness and swollen  Left  Arm from elbow to wrist. Which is weeping and oozing.. Pt can wiggle fingers. Pt unsure if she hit her arm , pt has temp of 101/6 has IV 20 rt wrist cbg was 97 pt is also rt AKA. Pt is smoker and sats were 94-96 placed on 2 l [place] O 2 per ems and remains on it . Pt also c/o to ems that it burns when she voids

## 2014-01-01 NOTE — ED Notes (Signed)
Anitra LauthPlunkett, MD notified of abnormal lab tests results

## 2014-01-01 NOTE — Progress Notes (Signed)
ANTIBIOTIC CONSULT NOTE - INITIAL  Pharmacy Consult for vancomycin, zosyn Indication: sepsis, cellulitis  No Known Allergies  Patient Measurements: Wt= 77kg   Vital Signs: Temp: 99.4 F (37.4 C) (06/19 1544) Temp src: Oral (06/19 1544) BP: 150/47 mmHg (06/19 1700) Pulse Rate: 80 (06/19 1700) Intake/Output from previous day:   Intake/Output from this shift:    Labs:  Recent Labs  01/01/14 1339 01/01/14 1416  WBC 3.0*  --   HGB 10.3* 11.6*  PLT 145*  --   CREATININE 3.96* 4.50*   The CrCl is unknown because both a height and weight (above a minimum accepted value) are required for this calculation. No results found for this basename: VANCOTROUGH, VANCOPEAK, VANCORANDOM, GENTTROUGH, GENTPEAK, GENTRANDOM, TOBRATROUGH, TOBRAPEAK, TOBRARND, AMIKACINPEAK, AMIKACINTROU, AMIKACIN,  in the last 72 hours   Microbiology: No results found for this or any previous visit (from the past 720 hour(s)).  Medical History: Past Medical History  Diagnosis Date  . Hypertension   . Hyperlipidemia   . Coronary artery disease     post CABG in 1999, with 2-vessel disease -- - LIMA graft to the LAD and a left radial artery graft to the PDA   . Old inferior wall myocardial infarction 1999  . 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 Tobacco dependency   . CKD (chronic kidney disease) stage 4, GFR 15-29 ml/min     followed by Dr. Arrie Aranoladonato  . Diastolic heart failure   . Aortic stenosis     Mild  . Type 2 diabetes mellitus with vascular disease   . CKD (chronic kidney disease), stage IV   . Hypertensive heart disease   . Anxiety   . Depression   . Cataract   . Ovarian cyst      Assessment: 80 yo female here with sepsis and also noted with left upper extremity cellulitis. Pharmacy has been consulted to dose vancomycin and Zosyn.  WBC= 3, Tmax= 101.1, SCr= 4.5  And CrCl ~ 10 (SCr= 3.22-3.41 in 07/2013)  6/19 zosyn>> 6/19 vanc>> 6/19 rocephin x1  6/19 blood x2 6/19  urine   Goal of Therapy:  Vancomycin trough level 15-20 mcg/ml  Plan:  -Vancomycin 1500mg  IV x1 followed by 1000mg  IV q48hr -Zosyn 3.375gm x1 followed by 2.25gm IV q6h -Will follow renal function, cultures and clinical progress  Harland Germanndrew Meyer, Pharm D 01/01/2014 5:26 PM

## 2014-01-01 NOTE — ED Notes (Addendum)
Dr Anitra Lauthplunkett intp speak to pt's family and update them as to pt being admitted and results of tests. Pt has UTI and arm infection pt had requested more pain meds for arm which was given

## 2014-01-01 NOTE — Consult Note (Signed)
Reason for Consult:left arm cellulitis Referring Physician: Dr Deneen Harts is an 80 y.o. female.  HPI: 80yo WF referred by Dr Maryan Rued to evaluate LUE pain and cellulitis. She reports symptoms started approximately 24-36 hours ago, having increased left upper extremity pain that is associated with swelling and erythema. she characterizes her arm pain as 10 out of 10, progressively worsening in the last 12 hours. She also has noted persistent weeping from the affected arm. She denies recent trauma, falls, or injuries to the affected extremity. She complains of associated weakness, functional decline, fevers, chills, malaise, anorexia, overall doing poorly in the last 12 hours. Patient reports she called her PCP who called in flexeril and she has taken 4 in the last 12 hours making her a little confused. Reports a scrape to her arm a few days ago    Past Medical History  Diagnosis Date  . Hypertension   . Hyperlipidemia   . Coronary artery disease     post CABG in 1999, with 2-vessel disease -- - LIMA graft to the LAD and a left radial artery graft to the PDA   . Old inferior wall myocardial infarction 1999  . 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 Tobacco dependency   . CKD (chronic kidney disease) stage 4, GFR 15-29 ml/min     followed by Dr. Marval Regal  . Diastolic heart failure   . Aortic stenosis     Mild  . Type 2 diabetes mellitus with vascular disease   . CKD (chronic kidney disease), stage IV   . Hypertensive heart disease   . Anxiety   . Depression   . Cataract   . Ovarian cyst     Past Surgical History  Procedure Laterality Date  . Partial hysterectomy    . Total vaginal hysterectomy    . 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 Iliac vein angioplasty / stenting  08/10/2010     Left common iliac stenting  . Coronary artery bypass graft  01/26/1998    Est. EF of 40-45% -- with 2-vessel disease -- LIMA graft to the LAD and a left radial  artery graft to the PDA   . Cardiac catheterization      Family History  Problem Relation Age of Onset  . Heart attack Father 31    Social History:  reports that she has been smoking Cigarettes.  She has a 30 pack-year smoking history. She has never used smokeless tobacco. She reports that she does not drink alcohol or use illicit drugs.  Allergies: No Known Allergies  Medications: I have reviewed the patient's current medications.  Results for orders placed during the hospital encounter of 01/01/14 (from the past 48 hour(s))  URINALYSIS, ROUTINE W REFLEX MICROSCOPIC     Status: Abnormal   Collection Time    01/01/14 12:33 PM      Result Value Ref Range   Color, Urine YELLOW  YELLOW   APPearance CLOUDY (*) CLEAR   Specific Gravity, Urine 1.018  1.005 - 1.030   pH 6.0  5.0 - 8.0   Glucose, UA NEGATIVE  NEGATIVE mg/dL   Hgb urine dipstick MODERATE (*) NEGATIVE   Bilirubin Urine NEGATIVE  NEGATIVE   Ketones, ur NEGATIVE  NEGATIVE mg/dL   Protein, ur >300 (*) NEGATIVE mg/dL   Urobilinogen, UA 0.2  0.0 - 1.0 mg/dL   Nitrite POSITIVE (*) NEGATIVE   Leukocytes, UA MODERATE (*)  NEGATIVE  URINE MICROSCOPIC-ADD ON     Status: Abnormal   Collection Time    01/01/14 12:33 PM      Result Value Ref Range   Squamous Epithelial / LPF MANY (*) RARE   WBC, UA 21-50  <3 WBC/hpf   RBC / HPF 3-6  <3 RBC/hpf   Bacteria, UA MANY (*) RARE  CBC WITH DIFFERENTIAL     Status: Abnormal   Collection Time    01/01/14  1:39 PM      Result Value Ref Range   WBC 3.0 (*) 4.0 - 10.5 K/uL   RBC 3.30 (*) 3.87 - 5.11 MIL/uL   Hemoglobin 10.3 (*) 12.0 - 15.0 g/dL   HCT 32.0 (*) 36.0 - 46.0 %   MCV 97.0  78.0 - 100.0 fL   MCH 31.2  26.0 - 34.0 pg   MCHC 32.2  30.0 - 36.0 g/dL   RDW 13.4  11.5 - 15.5 %   Platelets 145 (*) 150 - 400 K/uL   Neutrophils Relative % 80 (*) 43 - 77 %   Lymphocytes Relative 10 (*) 12 - 46 %   Monocytes Relative 9  3 - 12 %   Eosinophils Relative 1  0 - 5 %   Basophils  Relative 0  0 - 1 %   Neutro Abs 2.4  1.7 - 7.7 K/uL   Lymphs Abs 0.3 (*) 0.7 - 4.0 K/uL   Monocytes Absolute 0.3  0.1 - 1.0 K/uL   Eosinophils Absolute 0.0  0.0 - 0.7 K/uL   Basophils Absolute 0.0  0.0 - 0.1 K/uL   WBC Morphology INCREASED BANDS (>20% BANDS)    COMPREHENSIVE METABOLIC PANEL     Status: Abnormal   Collection Time    01/01/14  1:39 PM      Result Value Ref Range   Sodium 138  137 - 147 mEq/L   Potassium 5.0  3.7 - 5.3 mEq/L   Chloride 103  96 - 112 mEq/L   CO2 19  19 - 32 mEq/L   Glucose, Bld 97  70 - 99 mg/dL   BUN 53 (*) 6 - 23 mg/dL   Creatinine, Ser 3.96 (*) 0.50 - 1.10 mg/dL   Calcium 8.9  8.4 - 10.5 mg/dL   Total Protein 6.3  6.0 - 8.3 g/dL   Albumin 2.6 (*) 3.5 - 5.2 g/dL   AST 34  0 - 37 U/L   ALT 12  0 - 35 U/L   Alkaline Phosphatase 42  39 - 117 U/L   Total Bilirubin 0.4  0.3 - 1.2 mg/dL   GFR calc non Af Amer 10 (*) >90 mL/min   GFR calc Af Amer 12 (*) >90 mL/min   Comment: (NOTE)     The eGFR has been calculated using the CKD EPI equation.     This calculation has not been validated in all clinical situations.     eGFR's persistently <90 mL/min signify possible Chronic Kidney     Disease.  Randolm Idol, ED     Status: Abnormal   Collection Time    01/01/14  2:15 PM      Result Value Ref Range   Troponin i, poc 0.15 (*) 0.00 - 0.08 ng/mL   Comment NOTIFIED PHYSICIAN     Comment 3            Comment: Due to the release kinetics of cTnI,     a negative result within the first hours  of the onset of symptoms does not rule out     myocardial infarction with certainty.     If myocardial infarction is still suspected,     repeat the test at appropriate intervals.  I-STAT CHEM 8, ED     Status: Abnormal   Collection Time    01/01/14  2:16 PM      Result Value Ref Range   Sodium 138  137 - 147 mEq/L   Potassium 4.7  3.7 - 5.3 mEq/L   Chloride 106  96 - 112 mEq/L   BUN 47 (*) 6 - 23 mg/dL   Creatinine, Ser 4.50 (*) 0.50 - 1.10 mg/dL    Glucose, Bld 96  70 - 99 mg/dL   Calcium, Ion 1.18  1.13 - 1.30 mmol/L   TCO2 18  0 - 100 mmol/L   Hemoglobin 11.6 (*) 12.0 - 15.0 g/dL   HCT 34.0 (*) 36.0 - 46.0 %  I-STAT CG4 LACTIC ACID, ED     Status: None   Collection Time    01/01/14  2:17 PM      Result Value Ref Range   Lactic Acid, Venous 1.56  0.5 - 2.2 mmol/L    Dg Forearm Left  01/01/2014   CLINICAL DATA:  Redness and pain with swelling question with draining wounds over the left forearm  EXAM: LEFT FOREARM - 2 VIEW  COMPARISON:  None.  FINDINGS: A single AP view of the forearm reveals a numerous vascular clips throughout the forearm. There is heterogeneous density within the lateral aspect of the forearm soft tissues. No definite gas collections are demonstrated. The bones exhibit no acute abnormality.  IMPRESSION: There is no acute bony abnormality of the forearm. There is heterogeneous soft tissue density which may reflect cellulitis.   Electronically Signed   By: David  [place]   On: 01/01/2014 12:29   Dg Chest Port 1 View  01/01/2014   CLINICAL DATA:  Shortness of breath.  EXAM: PORTABLE CHEST - 1 VIEW  COMPARISON:  Chest x-ray 07/28/2013.  FINDINGS: Prior CABG. Cardiomegaly and pulmonary vascular prominence and bilateral interstitial prominence. No pleural effusion or pneumothorax. Pulmonary interstitial prominence has improved from prior study. No acute bony abnormality.  IMPRESSION: Improving congestive heart failure and interstitial edema .   Electronically Signed   By: Marcello Moores  Register   On: 01/01/2014 12:29    Review of Systems  Constitutional: Positive for fever, chills, malaise/fatigue and diaphoresis.  Eyes: Negative for double vision.  Respiratory: Negative for shortness of breath.   Cardiovascular: Negative for chest pain and palpitations.  Gastrointestinal: Negative for nausea, vomiting and abdominal pain.  Genitourinary: Positive for urgency.  Musculoskeletal:       Arm pain  Skin:       See HPI    Neurological: Positive for weakness. Negative for tremors, sensory change, seizures and loss of consciousness.   Blood pressure 150/47, pulse 80, temperature 99.4 F (37.4 C), temperature source Oral, resp. rate 16, SpO2 93.00%. Physical Exam  Vitals reviewed. Constitutional: She is oriented to person, place, and time. She appears well-developed and well-nourished. No distress.  Nontoxic elderly female  HENT:  Head: Normocephalic and atraumatic.  Right Ear: External ear normal.  Left Ear: External ear normal.  Eyes: Conjunctivae are normal. No scleral icterus.  Neck: Normal range of motion. Neck supple. No tracheal deviation present. No thyromegaly present.  Cardiovascular: Normal rate and normal heart sounds.   Pulses:      Radial pulses are 2+  on the right side, and 2+ on the left side.       Femoral pulses are 2+ on the right side, and 2+ on the left side. Respiratory: Effort normal and breath sounds normal. No stridor. No respiratory distress. She has no wheezes.  GI: Soft. She exhibits no distension. There is no tenderness.  Musculoskeletal: She exhibits edema.       Left elbow: She exhibits swelling.       Left wrist: She exhibits swelling. She exhibits normal range of motion.       Left hand: She exhibits tenderness and swelling. She exhibits normal range of motion and normal capillary refill. Normal sensation noted.  Right BKA  Lymphadenopathy:    She has no cervical adenopathy.  Neurological: She is alert and oriented to person, place, and time. She exhibits normal muscle tone.  Skin: Skin is warm. No rash noted. She is not diaphoretic. There is erythema. There is pallor.  Paper thin skin on b/l UE; has underlying dark skin changes on dorsum of right wrist/hand. No edema RUE/LLE; LUE - edematous from all DIP joints to 3 finger breaths above elbow. Has scattered blisters on L forearm draining clear serous fluid. Scattered areas of cellulitis which extends 3 finger breaths above  elbow. Scattered TTP in area. No crepitus. Skin is tissue paper thin. +cap refill, palp radial. Sensation intact throughout. FROM. No crepitus  Psychiatric: She has a normal mood and affect. Her behavior is normal. Judgment and thought content normal.        Assessment/Plan: Left upper extremity edema with cellulitis Protein calorie malnutrition Leukopenia/pancytopenia UTI Acute on Chronic kidney injury Hyperkalemia PVD H/o DM H/o heart failure  Unclear etiology as to source of cellulitis and significant edema of left upper extremity. Currently no signs of necrotizing soft tissue infection. There is no crepitus. Agree with CT scan Agree with IV antibiotics Elevate left upper extremity No tape on left upper extremity Monitor for signs of progression of cellulitis Dry gauze and ABDs and loosely wrapped Kerlix her arm and elbow My partner will follow up on the CT scan  If there is any muscular abnormality hand surgery will need to be involved  Leighton Ruff. Redmond Pulling, MD, FACS General, Bariatric, & Minimally Invasive Surgery [facility], [place]  [facility] M 01/01/2014, 5:22 PM

## 2014-01-01 NOTE — ED Notes (Signed)
Surgeon at bedisde

## 2014-01-01 NOTE — ED Notes (Signed)
Admitting MD at bedside.

## 2014-01-02 DIAGNOSIS — N390 Urinary tract infection, site not specified: Secondary | ICD-10-CM

## 2014-01-02 DIAGNOSIS — N184 Chronic kidney disease, stage 4 (severe): Secondary | ICD-10-CM

## 2014-01-02 DIAGNOSIS — IMO0002 Reserved for concepts with insufficient information to code with codable children: Secondary | ICD-10-CM

## 2014-01-02 LAB — COMPREHENSIVE METABOLIC PANEL
ALK PHOS: 40 U/L (ref 39–117)
ALT: 12 U/L (ref 0–35)
AST: 34 U/L (ref 0–37)
Albumin: 2.2 g/dL — ABNORMAL LOW (ref 3.5–5.2)
BUN: 64 mg/dL — ABNORMAL HIGH (ref 6–23)
CO2: 18 meq/L — AB (ref 19–32)
Calcium: 8.6 mg/dL (ref 8.4–10.5)
Chloride: 101 mEq/L (ref 96–112)
Creatinine, Ser: 4.29 mg/dL — ABNORMAL HIGH (ref 0.50–1.10)
GFR calc Af Amer: 11 mL/min — ABNORMAL LOW (ref >90)
GFR calc non Af Amer: 9 mL/min — ABNORMAL LOW (ref >90)
Glucose, Bld: 102 mg/dL — ABNORMAL HIGH (ref 70–99)
Potassium: 5 mEq/L (ref 3.7–5.3)
SODIUM: 137 meq/L (ref 137–147)
TOTAL PROTEIN: 5.8 g/dL — AB (ref 6.0–8.3)
Total Bilirubin: 0.2 mg/dL — ABNORMAL LOW (ref 0.3–1.2)

## 2014-01-02 LAB — GLUCOSE, CAPILLARY
GLUCOSE-CAPILLARY: 87 mg/dL (ref 70–99)
GLUCOSE-CAPILLARY: 112 mg/dL — AB (ref 70–99)
Glucose-Capillary: 103 mg/dL — ABNORMAL HIGH (ref 70–99)
Glucose-Capillary: 107 mg/dL — ABNORMAL HIGH (ref 70–99)

## 2014-01-02 LAB — CBC
HEMATOCRIT: 29.5 % — AB (ref 36.0–46.0)
Hemoglobin: 9.3 g/dL — ABNORMAL LOW (ref 12.0–15.0)
MCH: 29.8 pg (ref 26.0–34.0)
MCHC: 31.5 g/dL (ref 30.0–36.0)
MCV: 94.6 fL (ref 78.0–100.0)
Platelets: 142 10*3/uL — ABNORMAL LOW (ref 150–400)
RBC: 3.12 MIL/uL — ABNORMAL LOW (ref 3.87–5.11)
RDW: 13.6 % (ref 11.5–15.5)
WBC: 4 10*3/uL (ref 4.0–10.5)

## 2014-01-02 MED ORDER — HYDROMORPHONE HCL PF 1 MG/ML IJ SOLN
0.5000 mg | INTRAMUSCULAR | Status: DC | PRN
  Administered 2014-01-03 – 2014-01-04 (×5): 0.5 mg via INTRAVENOUS
  Filled 2014-01-02 (×5): qty 1

## 2014-01-02 NOTE — Progress Notes (Signed)
Patient straight - cathed for 500 ml. amber malodorous urine  with sediment.  Tol procedure well.  Prior bladder scan revealed > 200 ml in bladder.

## 2014-01-02 NOTE — Progress Notes (Signed)
Subjective: Feels better. Less arm pain  Objective: Vital signs in last 24 hours: Temp:  [97.4 F (36.3 C)-101.1 F (38.4 C)] 97.4 F (36.3 C) (06/20 1215) Pulse Rate:  [68-94] 68 (06/20 1215) Resp:  [9-26] 14 (06/20 1215) BP: (94-167)/(32-109) 129/49 mmHg (06/20 1215) SpO2:  [90 %-100 %] 96 % (06/20 1215) Weight:  [156 lb 12 oz (71.1 kg)-159 lb 9.8 oz (72.4 kg)] 159 lb 9.8 oz (72.4 kg) (06/20 0500) Last BM Date: 01/01/14  Intake/Output from previous day: 06/19 0701 - 06/20 0700 In: 788.8 [I.V.:688.8; IV Piggyback:100] Out: 250 [Urine:250] Intake/Output this shift:    Alert, nad LUE - cellullitis not advanced. About 3 fingerbreadths above elbow. Edema significantly improved. Scattered skin tears. NVI  Lab Results:   Recent Labs  01/01/14 2023 01/02/14 0316  WBC 2.4* 4.0  HGB 9.7* 9.3*  HCT 30.6* 29.5*  PLT 158 142*   BMET  Recent Labs  01/01/14 1339 01/01/14 1416 01/01/14 2023 01/02/14 0316  NA 138 138  --  137  K 5.0 4.7  --  5.0  CL 103 106  --  101  CO2 19  --   --  18*  GLUCOSE 97 96  --  102*  BUN 53* 47*  --  64*  CREATININE 3.96* 4.50* 4.11* 4.29*  CALCIUM 8.9  --   --  8.6   PT/INR No results found for this basename: LABPROT, INR,  in the last 72 hours ABG No results found for this basename: PHART, PCO2, PO2, HCO3,  in the last 72 hours  Studies/Results: Dg Forearm Left  01/01/2014   CLINICAL DATA:  Redness and pain with swelling question with draining wounds over the left forearm  EXAM: LEFT FOREARM - 2 VIEW  COMPARISON:  None.  FINDINGS: A single AP view of the forearm reveals a numerous vascular clips throughout the forearm. There is heterogeneous density within the lateral aspect of the forearm soft tissues. No definite gas collections are demonstrated. The bones exhibit no acute abnormality.  IMPRESSION: There is no acute bony abnormality of the forearm. There is heterogeneous soft tissue density which may reflect cellulitis.    Electronically Signed   By: David  [place]   On: 01/01/2014 12:29   Ct Forearm Left Wo Contrast  01/01/2014   CLINICAL DATA:  Extensive swelling, cellulitis, severe pain left forearm, patient reports symptoms began 24-36 hours ago with weeping from the affected are, denies trauma, reporting features and chills  EXAM: CT OF THE LEFT FOREARM WITHOUT CONTRAST  TECHNIQUE: Multidetector CT imaging was performed according to the standard protocol. Multiplanar CT image reconstructions were also generated.  COMPARISON:  01/01/14 radiograph  FINDINGS: There are numerous vascular clips along the volar surface of the forearm beginning just distal to the elbow joint and extending along the volar/radial surface all the way to the level of the wrist. There is diffuse thickening of the dermis circumferentially. There is reticular edematous change in the subcutaneous soft tissues circumferentially, of moderate severity diffusely, but particularly severe along the dorsal surface. Edematous changes extend into the hand. There is no evidence of focal organized fluid collection to suggest abscess. There is no focal osseous abnormalities to suggest osteomyelitis.  IMPRESSION: Severe diffuse edematous change consistent with severe cellulitis.   Electronically Signed   By: Esperanza Heiraymond  Rubner M.D.   On: 01/01/2014 18:25   Dg Chest Port 1 View  01/01/2014   CLINICAL DATA:  Shortness of breath.  EXAM: PORTABLE CHEST - 1 VIEW  COMPARISON:  Chest x-ray 07/28/2013.  FINDINGS: Prior CABG. Cardiomegaly and pulmonary vascular prominence and bilateral interstitial prominence. No pleural effusion or pneumothorax. Pulmonary interstitial prominence has improved from prior study. No acute bony abnormality.  IMPRESSION: Improving congestive heart failure and interstitial edema .   Electronically Signed   By: Maisie Fushomas  Register   On: 01/01/2014 12:29    Anti-infectives: Anti-infectives   Start     Dose/Rate Route Frequency Ordered Stop   01/03/14  2200  vancomycin (VANCOCIN) IVPB 1000 mg/200 mL premix     1,000 mg 200 mL/hr over 60 Minutes Intravenous Every 48 hours 01/01/14 1910     01/03/14 1600  vancomycin (VANCOCIN) IVPB 1000 mg/200 mL premix  Status:  Discontinued     1,000 mg 200 mL/hr over 60 Minutes Intravenous Every 48 hours 01/01/14 1513 01/01/14 1903   01/01/14 2359  piperacillin-tazobactam (ZOSYN) IVPB 2.25 g     2.25 g 100 mL/hr over 30 Minutes Intravenous 4 times per day 01/01/14 1729     01/01/14 1730  piperacillin-tazobactam (ZOSYN) IVPB 3.375 g     3.375 g 100 mL/hr over 30 Minutes Intravenous  Once 01/01/14 1729     01/01/14 1515  vancomycin (VANCOCIN) 1,500 mg in sodium chloride 0.9 % 500 mL IVPB     1,500 mg 250 mL/hr over 120 Minutes Intravenous  Once 01/01/14 1513 01/01/14 1740   01/01/14 1445  cefTRIAXone (ROCEPHIN) 1 g in dextrose 5 % 50 mL IVPB     1 g 100 mL/hr over 30 Minutes Intravenous  Once 01/01/14 1443 01/01/14 1544      Assessment/Plan: Left upper extremity edema with cellulitis  Protein calorie malnutrition  Leukopenia/anemia UTI  Acute on Chronic kidney injury  Hyperkalemia  PVD  H/o DM  H/o heart failure  Edema better. Cellulitis not advancing and improved in some areas.  No role for surgical intervention Cont elevation and abx No tape on skin  Mary SellaEric M. Andrey CampanileWilson, MD, FACS General, Bariatric, & Minimally Invasive Surgery [facility] [place]    LOS: 1 day    Atilano InaWILSON,ERIC M 01/02/2014

## 2014-01-02 NOTE — Progress Notes (Signed)
Received call from lab regarding positive blood cultures X 2 for Gm positive cocci in chains.  MD notified.  NNO.

## 2014-01-02 NOTE — Progress Notes (Signed)
[facility] PROGRESS NOTE  Ursula BeathMargie E Hottinger ZOX:[id number] DOB: 11/21/1933 DOA: 01/01/2014 PCP: Kaleen MaskELKINS,WILSON OLIVER, MD  Assessment/Plan: 1. Sepsis -Present on admission, evidence by leukopenia with white count of 3000, presence of bands, temperature of 101.1, respiratory rate of 27 -Source of infection likely to be left upper extremity cellulitis.  -Continue supportive care, IV fluids, IV antimicrobial therapy, will follow up on cultures.   2.  Left upper extremity cellulitis -CT scan of left upper extremity showing severe diffuse changes consistent with cellulitis. I had been concerned about the severity of her symptoms, considering the possibility of nec fasciitis, thus consulted surgery.  -She seems a little better this morning, will continue IV AB converage with Vanc and Zosyn  -Wound care consulted -Follow up on cultures.  -Appreciate surgery's input    3. Acute on Chronic Renal Failure -Patient with history of stage IV CKD, baseline creatinine near 3.4.  -Initial labs showing creatinine of 4.5, coming down to 4.2 on this morning's labs -Likely secondary to sepsis and possibly hypovolemia from diuretic therapy. Will continue IV fluids, follow up on am labs.   4. Chronic diastolic CHF -Compensated.  -Diuretics stopped on admission.  -Follow volume status closely as she is getting IV fluids  5. UTI -On broad spectrum AB therapy  6. Type 2 diabetes Mellitus -Continue accuchecks ac and hs  Code Status: Full Family Communication:  Disposition Plan: Continue IV fluids, IV AB's   Consultants:  Surgery   Antibiotics:  Vanc  Zosyn  HPI/Subjective: Patient admitted overnight with severe cellulitis of her left upper extremity. This morning she thinks she is feeling a little better with decreased swelling to her arm.   Objective: Filed Vitals:   01/02/14 0812  BP: 133/39  Pulse: 70  Temp: 98.5 F (36.9 C)  Resp: 19    Intake/Output Summary (Last 24 hours)  at 01/02/14 0836 Last data filed at 01/02/14 [id number]  Gross per 24 hour  Intake 788.75 ml  Output    250 ml  Net 538.75 ml   Filed Weights   01/01/14 1854 01/02/14 0500  Weight: 71.1 kg (156 lb 12 oz) 72.4 kg (159 lb 9.8 oz)    Exam:   General:  Patient is awake and alert, seem a little better today  Cardiovascular: Regular rate and rhythm, normal S1S2  Respiratory: Clear to auscultation, normal inspiratory effort  Abdomen: Soft nontender nondistended  Musculoskeletal: There is some decrease in swelling of left upper extremity, there is ongoing weeping with blistering. She has sensation intake in her hands.   Data Reviewed: Basic Metabolic Panel:  Recent Labs Lab 01/01/14 1339 01/01/14 1416 01/01/14 2023 01/02/14 0316  NA 138 138  --  137  K 5.0 4.7  --  5.0  CL 103 106  --  101  CO2 19  --   --  18*  GLUCOSE 97 96  --  102*  BUN 53* 47*  --  64*  CREATININE 3.96* 4.50* 4.11* 4.29*  CALCIUM 8.9  --   --  8.6   Liver Function Tests:  Recent Labs Lab 01/01/14 1339 01/02/14 0316  AST 34 34  ALT 12 12  ALKPHOS 42 40  BILITOT 0.4 0.2*  PROT 6.3 5.8*  ALBUMIN 2.6* 2.2*   No results found for this basename: LIPASE, AMYLASE,  in the last 168 hours No results found for this basename: AMMONIA,  in the last 168 hours CBC:  Recent Labs Lab 01/01/14 1339 01/01/14 1416 01/01/14 2023 01/02/14 0316  WBC 3.0*  --  2.4* 4.0  NEUTROABS 2.4  --   --   --   HGB 10.3* 11.6* 9.7* 9.3*  HCT 32.0* 34.0* 30.6* 29.5*  MCV 97.0  --  96.5 94.6  PLT 145*  --  158 142*   Cardiac Enzymes: No results found for this basename: CKTOTAL, CKMB, CKMBINDEX, TROPONINI,  in the last 168 hours BNP (last 3 results)  Recent Labs  07/28/13 1650  PROBNP 32203.0*   CBG:  Recent Labs Lab 01/01/14 2132 01/02/14 0821  GLUCAP 106* 87    Recent Results (from the past 240 hour(s))  MRSA PCR SCREENING     Status: None   Collection Time    01/01/14  7:00 PM      Result Value Ref  Range Status   MRSA by PCR NEGATIVE  NEGATIVE Final   Comment:            The GeneXpert MRSA Assay (FDA     approved for NASAL specimens     only), is one component of a     comprehensive MRSA colonization     surveillance program. It is not     intended to diagnose MRSA     infection nor to guide or     monitor treatment for     MRSA infections.     Studies: Dg Forearm Left  01/01/2014   CLINICAL DATA:  Redness and pain with swelling question with draining wounds over the left forearm  EXAM: LEFT FOREARM - 2 VIEW  COMPARISON:  None.  FINDINGS: A single AP view of the forearm reveals a numerous vascular clips throughout the forearm. There is heterogeneous density within the lateral aspect of the forearm soft tissues. No definite gas collections are demonstrated. The bones exhibit no acute abnormality.  IMPRESSION: There is no acute bony abnormality of the forearm. There is heterogeneous soft tissue density which may reflect cellulitis.   Electronically Signed   By: David  [place]   On: 01/01/2014 12:29   Ct Forearm Left Wo Contrast  01/01/2014   CLINICAL DATA:  Extensive swelling, cellulitis, severe pain left forearm, patient reports symptoms began 24-36 hours ago with weeping from the affected are, denies trauma, reporting features and chills  EXAM: CT OF THE LEFT FOREARM WITHOUT CONTRAST  TECHNIQUE: Multidetector CT imaging was performed according to the standard protocol. Multiplanar CT image reconstructions were also generated.  COMPARISON:  01/01/14 radiograph  FINDINGS: There are numerous vascular clips along the volar surface of the forearm beginning just distal to the elbow joint and extending along the volar/radial surface all the way to the level of the wrist. There is diffuse thickening of the dermis circumferentially. There is reticular edematous change in the subcutaneous soft tissues circumferentially, of moderate severity diffusely, but particularly severe along the dorsal surface.  Edematous changes extend into the hand. There is no evidence of focal organized fluid collection to suggest abscess. There is no focal osseous abnormalities to suggest osteomyelitis.  IMPRESSION: Severe diffuse edematous change consistent with severe cellulitis.   Electronically Signed   By: Esperanza Heir M.D.   On: 01/01/2014 18:25   Dg Chest Port 1 View  01/01/2014   CLINICAL DATA:  Shortness of breath.  EXAM: PORTABLE CHEST - 1 VIEW  COMPARISON:  Chest x-ray 07/28/2013.  FINDINGS: Prior CABG. Cardiomegaly and pulmonary vascular prominence and bilateral interstitial prominence. No pleural effusion or pneumothorax. Pulmonary interstitial prominence has improved from prior study. No acute bony abnormality.  IMPRESSION: Improving congestive heart failure and interstitial edema .   Electronically Signed   By: Maisie Fushomas  Register   On: 01/01/2014 12:29    Scheduled Meds: . aspirin EC  81 mg Oral Daily  . heparin  5,000 Units Subcutaneous 3 times per day  . hydrALAZINE  75 mg Oral TID  . insulin aspart  0-15 Units Subcutaneous TID WC  . insulin aspart  0-5 Units Subcutaneous QHS  . piperacillin-tazobactam (ZOSYN)  IV  2.25 g Intravenous 4 times per day  . piperacillin-tazobactam  3.375 g Intravenous Once  . sodium chloride  3 mL Intravenous Q12H  . [START ON 01/03/2014] vancomycin  1,000 mg Intravenous Q48H   Continuous Infusions: . sodium chloride 75 mL/hr at 01/01/14 2140    Principal Problem:   Cellulitis Active Problems:   Sepsis   Acute on chronic renal failure   Diabetes mellitus with peripheral vascular disease   Hypertensive heart disease   Tobacco dependency   Chronic diastolic CHF (congestive heart failure)    Time spent: 35 min    Jeralyn BennettZAMORA, Malvika Tung  [facility] Pager [phone]. If 7PM-7AM, please contact night-coverage at [email or web address], password [email or web address] 01/02/2014, 8:36 AM  LOS: 1 day

## 2014-01-03 LAB — URINE CULTURE: Colony Count: >=100000

## 2014-01-03 LAB — BASIC METABOLIC PANEL
BUN: 77 mg/dL — ABNORMAL HIGH (ref 6–23)
CHLORIDE: 101 meq/L (ref 96–112)
CO2: 16 meq/L — AB (ref 19–32)
CREATININE: 4.61 mg/dL — AB (ref 0.50–1.10)
Calcium: 8.6 mg/dL (ref 8.4–10.5)
GFR calc non Af Amer: 8 mL/min — ABNORMAL LOW (ref >90)
GFR, EST AFRICAN AMERICAN: 10 mL/min — AB (ref >90)
Glucose, Bld: 86 mg/dL (ref 70–99)
POTASSIUM: 4.9 meq/L (ref 3.7–5.3)
Sodium: 136 mEq/L — ABNORMAL LOW (ref 137–147)

## 2014-01-03 LAB — GLUCOSE, CAPILLARY
GLUCOSE-CAPILLARY: 101 mg/dL — AB (ref 70–99)
GLUCOSE-CAPILLARY: 74 mg/dL (ref 70–99)
Glucose-Capillary: 79 mg/dL (ref 70–99)
Glucose-Capillary: 89 mg/dL (ref 70–99)

## 2014-01-03 LAB — CBC
HCT: 27.8 % — ABNORMAL LOW (ref 36.0–46.0)
Hemoglobin: 8.9 g/dL — ABNORMAL LOW (ref 12.0–15.0)
MCH: 30.4 pg (ref 26.0–34.0)
MCHC: 32 g/dL (ref 30.0–36.0)
MCV: 94.9 fL (ref 78.0–100.0)
PLATELETS: 157 10*3/uL (ref 150–400)
RBC: 2.93 MIL/uL — AB (ref 3.87–5.11)
RDW: 14 % (ref 11.5–15.5)
WBC: 5.9 10*3/uL (ref 4.0–10.5)

## 2014-01-03 MED ORDER — SODIUM CHLORIDE 0.9 % IV SOLN
INTRAVENOUS | Status: DC
  Administered 2014-01-03: 09:00:00 via INTRAVENOUS
  Administered 2014-01-03: 100 mL via INTRAVENOUS

## 2014-01-03 MED ORDER — POLYETHYLENE GLYCOL 3350 17 G PO PACK
17.0000 g | PACK | Freq: Every day | ORAL | Status: DC
  Administered 2014-01-03 – 2014-01-09 (×3): 17 g via ORAL
  Filled 2014-01-03 (×7): qty 1

## 2014-01-03 MED ORDER — SODIUM CHLORIDE 0.9 % IV BOLUS (SEPSIS)
500.0000 mL | Freq: Once | INTRAVENOUS | Status: AC
  Administered 2014-01-03: 500 mL via INTRAVENOUS

## 2014-01-03 MED ORDER — SODIUM CHLORIDE 0.9 % IV BOLUS (SEPSIS)
500.0000 mL | Freq: Once | INTRAVENOUS | Status: AC
  Administered 2014-01-03: 09:00:00 via INTRAVENOUS

## 2014-01-03 MED ORDER — DOCUSATE SODIUM 100 MG PO CAPS
100.0000 mg | ORAL_CAPSULE | Freq: Two times a day (BID) | ORAL | Status: DC
  Administered 2014-01-04 – 2014-01-19 (×18): 100 mg via ORAL
  Filled 2014-01-03 (×34): qty 1

## 2014-01-03 NOTE — Consult Note (Signed)
WOC wound consult note Reason for Consult: Weeping of left Upper Extremity.  MD in to assess during the time of my visit and Dr. Gaynelle AduEric Wilson of [facility] was in to see past pm.  Patient reports diminished pain since elevation overnight and now rates her discomfort as a 2-3. Wound type: Partial thickness blisters (both fluid (light yellow serum)-filled and ruptured) due to vascular insufficiency (edema/lymphedema) Pressure Ulcer POA: No Measurement: Two areas on left upper extremity:  Distal area measures 20cm x 4cm and area at elbow measures 7cm x 6cm. Blisters are intact with light yellow serum.  Two sites of spontaneously ruptured blisters are moist, pink and measure <2cm x 1xm x 0.1cm Wound bed:As described above. Drainage (amount, consistency, odor) Serous, no odor, minimal amount now Periwound: intact with edema, some areas of ecchymosis as noted in Dr. Tawana ScaleWilson's photo from yesterday evening. Dressing procedure/placement/frequency: Conservative POC is implemented for the fluid-filled blisters on the left UE with decreasing edema now with elevation and fluid management.  White petrolatum gauze dressings in a double layer to provide moisture but not over-hydrate her fragile tissues is applied and then covered with ABD pads and securement with Kerlix roll gauze/tape.  We will change these twice daily to prevent drying and adherence to the above mentioned fragile tissues. WOC nursing team will not follow, but will remain available to this patient, the nursing and medical teams.  Please re-consult if needed. Thanks, Ladona MowLaurie McNichol, MSN, RN, GNP, [place]OCN, CWON-AP [phone])

## 2014-01-03 NOTE — Progress Notes (Signed)
[facility] PROGRESS NOTE  Ursula BeathMargie E Milbourn WUJ:[id number] DOB: July 19, 1933 DOA: 01/01/2014 PCP: Kaleen MaskELKINS,WILSON OLIVER, MD  Assessment/Plan: 1. Sepsis -Present on admission, evidence by leukopenia with white count of 3000, presence of bands, temperature of 101.1, respiratory rate of 27, having positive blood cultures -Source of infection likely to be left upper extremity cellulitis.  -Left upper extremity appearing better -Continue supportive care, IV fluids, IV antimicrobial therapy -Microbiology reporting that blood cultures positive for gram positive cocci. Repeat blood cultures drawn on 01/02/2014  2.  Left upper extremity cellulitis -CT scan of left upper extremity showing severe diffuse changes consistent with cellulitis. I had been concerned about the severity of her symptoms, considering the possibility of nec fasciitis, thus consulted surgery.  -She seems a little better this morning, will continue IV AB converage with Vanc and Zosyn  -Wound care consulted -Blood cultures growing gram positive cocci  3.  Acute on Chronic Renal Failure -Patient with history of stage IV CKD, baseline creatinine near 3.4.  -AM lab work showing creatinine of 4.6. I think acute on chronic renal failure likely secondary to sepsis -She had urine output of 675 yesterday -Will continue IV fluid resuscitation     4. Chronic diastolic CHF -Compensated.  -Diuretics stopped on admission.  -Follow volume status closely as she is getting IV fluids  5. UTI -On broad spectrum AB therapy, will follow up on urine cultures  6. Type 2 diabetes Mellitus -Continue accuchecks ac and hs  Code Status: Full Family Communication: Spoke with her 2 son's  Disposition Plan: Continue IV fluids, IV AB's   Consultants:  Surgery   Antibiotics:  Vanc  Zosyn  HPI/Subjective: Patient admitted overnight with severe cellulitis of her left upper extremity. This morning she thinks she is feeling a little better  with decreased swelling to her arm.   Objective: Filed Vitals:   01/03/14 0738  BP: 148/30  Pulse: 74  Temp: 98.2 F (36.8 C)  Resp: 11    Intake/Output Summary (Last 24 hours) at 01/03/14 0859 Last data filed at 01/03/14 0600  Gross per 24 hour  Intake   2270 ml  Output    675 ml  Net   1595 ml   Filed Weights   01/01/14 1854 01/02/14 0500 01/03/14 0312  Weight: 71.1 kg (156 lb 12 oz) 72.4 kg (159 lb 9.8 oz) 73.4 kg (161 lb 13.1 oz)    Exam:   General:  Patient is awake and alert, seem a little better today  Cardiovascular: Regular rate and rhythm, normal S1S2  Respiratory: Clear to auscultation, normal inspiratory effort  Abdomen: Soft nontender nondistended  Musculoskeletal: There is some decrease in swelling of left upper extremity, there is ongoing weeping with blistering. She has sensation intake in her hands.   Data Reviewed: Basic Metabolic Panel:  Recent Labs Lab 01/01/14 1339 01/01/14 1416 01/01/14 2023 01/02/14 0316 01/03/14 0235  NA 138 138  --  137 136*  K 5.0 4.7  --  5.0 4.9  CL 103 106  --  101 101  CO2 19  --   --  18* 16*  GLUCOSE 97 96  --  102* 86  BUN 53* 47*  --  64* 77*  CREATININE 3.96* 4.50* 4.11* 4.29* 4.61*  CALCIUM 8.9  --   --  8.6 8.6   Liver Function Tests:  Recent Labs Lab 01/01/14 1339 01/02/14 0316  AST 34 34  ALT 12 12  ALKPHOS 42 40  BILITOT 0.4 0.2*  PROT  6.3 5.8*  ALBUMIN 2.6* 2.2*   No results found for this basename: LIPASE, AMYLASE,  in the last 168 hours No results found for this basename: AMMONIA,  in the last 168 hours CBC:  Recent Labs Lab 01/01/14 1339 01/01/14 1416 01/01/14 2023 01/02/14 0316 01/03/14 0235  WBC 3.0*  --  2.4* 4.0 5.9  NEUTROABS 2.4  --   --   --   --   HGB 10.3* 11.6* 9.7* 9.3* 8.9*  HCT 32.0* 34.0* 30.6* 29.5* 27.8*  MCV 97.0  --  96.5 94.6 94.9  PLT 145*  --  158 142* 157   Cardiac Enzymes: No results found for this basename: CKTOTAL, CKMB, CKMBINDEX, TROPONINI,   in the last 168 hours BNP (last 3 results)  Recent Labs  07/28/13 1650  PROBNP 32203.0*   CBG:  Recent Labs Lab 01/01/14 2132 01/02/14 0821 01/02/14 1219 01/02/14 1723 01/02/14 2115  GLUCAP 106* 87 103* 107* 112*    Recent Results (from the past 240 hour(s))  CULTURE, BLOOD (ROUTINE X 2)     Status: None   Collection Time    01/01/14  1:50 PM      Result Value Ref Range Status   Specimen Description BLOOD ARM RIGHT   Final   Special Requests     Final   Value: BOTTLES DRAWN AEROBIC AND ANAEROBIC BLUE 10CC RED 5CC   Culture  Setup Time     Final   Value: 01/01/2014 22:00     [facility]   Culture     Final   Value: GRAM POSITIVE COCCI IN CHAINS     Note: Gram Stain Report Called to,Read Back By and Verified With: [facility] LAFFARD 01/02/14 @ 12:27PM BY RUSCOE A.     [facility]formed at [facility]   Report Status PENDING   Incomplete  CULTURE, BLOOD (ROUTINE X 2)     Status: None   Collection Time    01/01/14  2:00 PM      Result Value Ref Range Status   Specimen Description BLOOD HAND RIGHT   Final   Special Requests BOTTLES DRAWN AEROBIC ONLY 10CC   Final   Culture  Setup Time     Final   Value: 01/01/2014 22:01     [facility]formed at [facility]   Culture     Final   Value: GRAM POSITIVE COCCI IN CHAINS     Note: Gram Stain Report Called to,Read Back By and Verified With: [facility] LAFFARD 01/02/14 @ 12:27PM BY RUSCOE A.     Performed at [facility]   Report Status PENDING   Incomplete  MRSA PCR SCREENING     Status: None   Collection Time    01/01/14  7:00 PM      Result Value Ref Range Status   MRSA by PCR NEGATIVE  NEGATIVE Final   Comment:            The GeneXpert MRSA Assay (FDA     approved for NASAL specimens     only), is one component of a     comprehensive MRSA colonization     surveillance program. It is not     intended to diagnose MRSA     infection nor to guide or     monitor treatment for     MRSA infections.      Studies: Dg Forearm Left  01/01/2014   CLINICAL DATA:  Redness and pain with swelling question  with draining wounds over the left forearm  EXAM: LEFT FOREARM - 2 VIEW  COMPARISON:  None.  FINDINGS: A single AP view of the forearm reveals a numerous vascular clips throughout the forearm. There is heterogeneous density within the lateral aspect of the forearm soft tissues. No definite gas collections are demonstrated. The bones exhibit no acute abnormality.  IMPRESSION: There is no acute bony abnormality of the forearm. There is heterogeneous soft tissue density which may reflect cellulitis.   Electronically Signed   By: David  [place]   On: 01/01/2014 12:29   Ct Forearm Left Wo Contrast  01/01/2014   CLINICAL DATA:  Extensive swelling, cellulitis, severe pain left forearm, patient reports symptoms began 24-36 hours ago with weeping from the affected are, denies trauma, reporting features and chills  EXAM: CT OF THE LEFT FOREARM WITHOUT CONTRAST  TECHNIQUE: Multidetector CT imaging was performed according to the standard protocol. Multiplanar CT image reconstructions were also generated.  COMPARISON:  01/01/14 radiograph  FINDINGS: There are numerous vascular clips along the volar surface of the forearm beginning just distal to the elbow joint and extending along the volar/radial surface all the way to the level of the wrist. There is diffuse thickening of the dermis circumferentially. There is reticular edematous change in the subcutaneous soft tissues circumferentially, of moderate severity diffusely, but particularly severe along the dorsal surface. Edematous changes extend into the hand. There is no evidence of focal organized fluid collection to suggest abscess. There is no focal osseous abnormalities to suggest osteomyelitis.  IMPRESSION: Severe diffuse edematous change consistent with severe cellulitis.   Electronically Signed   By: Esperanza Heir M.D.   On: 01/01/2014 18:25   Dg Chest Port 1  View  01/01/2014   CLINICAL DATA:  Shortness of breath.  EXAM: PORTABLE CHEST - 1 VIEW  COMPARISON:  Chest x-ray 07/28/2013.  FINDINGS: Prior CABG. Cardiomegaly and pulmonary vascular prominence and bilateral interstitial prominence. No pleural effusion or pneumothorax. Pulmonary interstitial prominence has improved from prior study. No acute bony abnormality.  IMPRESSION: Improving congestive heart failure and interstitial edema .   Electronically Signed   By: Maisie Fus  Register   On: 01/01/2014 12:29    Scheduled Meds: . aspirin EC  81 mg Oral Daily  . heparin  5,000 Units Subcutaneous 3 times per day  . hydrALAZINE  75 mg Oral TID  . insulin aspart  0-15 Units Subcutaneous TID WC  . insulin aspart  0-5 Units Subcutaneous QHS  . piperacillin-tazobactam (ZOSYN)  IV  2.25 g Intravenous 4 times per day  . piperacillin-tazobactam  3.375 g Intravenous Once  . sodium chloride  500 mL Intravenous Once  . sodium chloride  3 mL Intravenous Q12H  . vancomycin  1,000 mg Intravenous Q48H   Continuous Infusions: . sodium chloride      Principal Problem:   Cellulitis Active Problems:   Sepsis   Acute on chronic renal failure   Diabetes mellitus with peripheral vascular disease   Hypertensive heart disease   Tobacco dependency   Chronic diastolic CHF (congestive heart failure)    Time spent: 35 min    Jeralyn Bennett  [facility] Pager [phone]. If 7PM-7AM, please contact night-coverage at [email or web address], password [facility] 01/03/2014, 8:59 AM  LOS: 2 days

## 2014-01-03 NOTE — Progress Notes (Signed)
L forearm cellulitis gradually improving. I spoke with her son. Patient examined and I agree with the assessment and plan  Violeta GelinasBurke Thompson, MD, MPH, FACS Trauma: [phone] General Surgery: [phone]  01/03/2014 11:18 AM

## 2014-01-03 NOTE — Progress Notes (Signed)
[facility] Progress Note     Subjective: Pt doing better.  Dressing changes going well.  She's keeping it elevated.  Pain improved.  Infection seems to be improving.  Objective: Vital signs in last 24 hours: Temp:  [97.4 F (36.3 C)-98.8 F (37.1 C)] 98.2 F (36.8 C) (06/21 0738) Pulse Rate:  [67-74] 74 (06/21 0738) Resp:  [8-16] 11 (06/21 0738) BP: (108-156)/(25-61) 148/30 mmHg (06/21 0738) SpO2:  [96 %-100 %] 96 % (06/21 0738) Weight:  [161 lb 13.1 oz (73.4 kg)] 161 lb 13.1 oz (73.4 kg) (06/21 0312) Last BM Date: 01/01/14  Intake/Output from previous day: 06/20 0701 - 06/21 0700 In: 2525 [P.O.:600; I.V.:1725; IV Piggyback:200] Out: 675 [Urine:675] Intake/Output this shift:    PE: Gen:  Alert, NAD, pleasant Skin:  Erythema/edema from hand to mid humerus appears much improved and is receding away from the marker lines since last pictures taken.  Skin is less tight and epidermal blistering/bulla seem to be quite flat, some degree of skin ulceration is seen scattered throughout.  No apparent abscess pockets.   01/01/14   01/03/14  01/03/14   Lab Results:   Recent Labs  01/02/14 0316 01/03/14 0235  WBC 4.0 5.9  HGB 9.3* 8.9*  HCT 29.5* 27.8*  PLT 142* 157   BMET  Recent Labs  01/02/14 0316 01/03/14 0235  NA 137 136*  K 5.0 4.9  CL 101 101  CO2 18* 16*  GLUCOSE 102* 86  BUN 64* 77*  CREATININE 4.29* 4.61*  CALCIUM 8.6 8.6   PT/INR No results found for this basename: LABPROT, INR,  in the last 72 hours CMP     Component Value Date/Time   NA 136* 01/03/2014 0235   K 4.9 01/03/2014 0235   CL 101 01/03/2014 0235   CO2 16* 01/03/2014 0235   GLUCOSE 86 01/03/2014 0235   BUN 77* 01/03/2014 0235   CREATININE 4.61* 01/03/2014 0235   CALCIUM 8.6 01/03/2014 0235   PROT 5.8* 01/02/2014 0316   ALBUMIN 2.2* 01/02/2014 0316   AST 34 01/02/2014 0316   ALT 12 01/02/2014 0316   ALKPHOS 40 01/02/2014 0316   BILITOT 0.2* 01/02/2014 0316   GFRNONAA 8*  01/03/2014 0235   GFRAA 10* 01/03/2014 0235   Lipase  No results found for this basename: lipase       Studies/Results: Dg Forearm Left  01/01/2014   CLINICAL DATA:  Redness and pain with swelling question with draining wounds over the left forearm  EXAM: LEFT FOREARM - 2 VIEW  COMPARISON:  None.  FINDINGS: A single AP view of the forearm reveals a numerous vascular clips throughout the forearm. There is heterogeneous density within the lateral aspect of the forearm soft tissues. No definite gas collections are demonstrated. The bones exhibit no acute abnormality.  IMPRESSION: There is no acute bony abnormality of the forearm. There is heterogeneous soft tissue density which may reflect cellulitis.   Electronically Signed   By: David  Swazil[place]   On: 01/01/2014 12:29   Ct Forearm Left Wo Contrast  01/01/2014   CLINICAL DATA:  Extensive swelling, cellulitis, severe pain left forearm, patient reports symptoms began 24-36 hours ago with weeping from the affected are, denies trauma, reporting features and chills  EXAM: CT OF THE LEFT FOREARM WITHOUT CONTRAST  TECHNIQUE: Multidetector CT imaging was performed according to the standard protocol. Multiplanar CT image reconstructions were also generated.  COMPARISON:  01/01/14 radiograph  FINDINGS: There are numerous vascular clips along the volar surface  of the forearm beginning just distal to the elbow joint and extending along the volar/radial surface all the way to the level of the wrist. There is diffuse thickening of the dermis circumferentially. There is reticular edematous change in the subcutaneous soft tissues circumferentially, of moderate severity diffusely, but particularly severe along the dorsal surface. Edematous changes extend into the hand. There is no evidence of focal organized fluid collection to suggest abscess. There is no focal osseous abnormalities to suggest osteomyelitis.  IMPRESSION: Severe diffuse edematous change consistent with  severe cellulitis.   Electronically Signed   By: Esperanza Heiraymond  Rubner M.D.   On: 01/01/2014 18:25   Dg Chest Port 1 View  01/01/2014   CLINICAL DATA:  Shortness of breath.  EXAM: PORTABLE CHEST - 1 VIEW  COMPARISON:  Chest x-ray 07/28/2013.  FINDINGS: Prior CABG. Cardiomegaly and pulmonary vascular prominence and bilateral interstitial prominence. No pleural effusion or pneumothorax. Pulmonary interstitial prominence has improved from prior study. No acute bony abnormality.  IMPRESSION: Improving congestive heart failure and interstitial edema .   Electronically Signed   By: Maisie Fushomas  Register   On: 01/01/2014 12:29    Anti-infectives: Anti-infectives   Start     Dose/Rate Route Frequency Ordered Stop   01/03/14 2200  vancomycin (VANCOCIN) IVPB 1000 mg/200 mL premix     1,000 mg 200 mL/hr over 60 Minutes Intravenous Every 48 hours 01/01/14 1910     01/03/14 1600  vancomycin (VANCOCIN) IVPB 1000 mg/200 mL premix  Status:  Discontinued     1,000 mg 200 mL/hr over 60 Minutes Intravenous Every 48 hours 01/01/14 1513 01/01/14 1903   01/01/14 2359  piperacillin-tazobactam (ZOSYN) IVPB 2.25 g     2.25 g 100 mL/hr over 30 Minutes Intravenous 4 times per day 01/01/14 1729     01/01/14 1730  piperacillin-tazobactam (ZOSYN) IVPB 3.375 g     3.375 g 100 mL/hr over 30 Minutes Intravenous  Once 01/01/14 1729     01/01/14 1515  vancomycin (VANCOCIN) 1,500 mg in sodium chloride 0.9 % 500 mL IVPB     1,500 mg 250 mL/hr over 120 Minutes Intravenous  Once 01/01/14 1513 01/03/14 0700   01/01/14 1445  cefTRIAXone (ROCEPHIN) 1 g in dextrose 5 % 50 mL IVPB     1 g 100 mL/hr over 30 Minutes Intravenous  Once 01/01/14 1443 01/01/14 1544       Assessment/Plan Left upper extremity edema with cellulitis  Protein calorie malnutrition  Leukopenia/anemia  UTI  Acute on Chronic kidney injury  Hyperkalemia  PVD  H/o DM  H/o heart failure   Plan: -Edema better. Cellulitis not advancing and improved compared to  yesterday.  -No role for surgical intervention  -Cont elevation, IV abx (Vancomycin & Zosyn Day #3, wound care, do not attempt to open bulla as this may increase risk of worsening infection  -No tape on skin use Kerlix to wrap arm     LOS: 2 days    DORT, MEGAN 01/03/2014, 9:50 AM Pager: [phone]

## 2014-01-04 DIAGNOSIS — A409 Streptococcal sepsis, unspecified: Principal | ICD-10-CM

## 2014-01-04 LAB — BASIC METABOLIC PANEL
BUN: 77 mg/dL — ABNORMAL HIGH (ref 6–23)
CO2: 15 mEq/L — ABNORMAL LOW (ref 19–32)
Calcium: 8.4 mg/dL (ref 8.4–10.5)
Chloride: 102 mEq/L (ref 96–112)
Creatinine, Ser: 4.72 mg/dL — ABNORMAL HIGH (ref 0.50–1.10)
GFR calc Af Amer: 9 mL/min — ABNORMAL LOW (ref >90)
GFR calc non Af Amer: 8 mL/min — ABNORMAL LOW (ref >90)
GLUCOSE: 87 mg/dL (ref 70–99)
POTASSIUM: 4.8 meq/L (ref 3.7–5.3)
SODIUM: 134 meq/L — AB (ref 137–147)

## 2014-01-04 LAB — GLUCOSE, CAPILLARY
GLUCOSE-CAPILLARY: 113 mg/dL — AB (ref 70–99)
GLUCOSE-CAPILLARY: 115 mg/dL — AB (ref 70–99)
GLUCOSE-CAPILLARY: 123 mg/dL — AB (ref 70–99)
Glucose-Capillary: 77 mg/dL (ref 70–99)
Glucose-Capillary: 106 mg/dL — ABNORMAL HIGH (ref 70–99)

## 2014-01-04 LAB — CBC
HCT: 25.7 % — ABNORMAL LOW (ref 36.0–46.0)
Hemoglobin: 8.3 g/dL — ABNORMAL LOW (ref 12.0–15.0)
MCH: 30.1 pg (ref 26.0–34.0)
MCHC: 32.3 g/dL (ref 30.0–36.0)
MCV: 93.1 fL (ref 78.0–100.0)
Platelets: 171 10*3/uL (ref 150–400)
RBC: 2.76 MIL/uL — ABNORMAL LOW (ref 3.87–5.11)
RDW: 14 % (ref 11.5–15.5)
WBC: 8.9 10*3/uL (ref 4.0–10.5)

## 2014-01-04 LAB — CULTURE, BLOOD (ROUTINE X 2)

## 2014-01-04 MED ORDER — BIOTENE DRY MOUTH MT LIQD
15.0000 mL | Freq: Two times a day (BID) | OROMUCOSAL | Status: DC
  Administered 2014-01-04 – 2014-01-08 (×9): 15 mL via OROMUCOSAL

## 2014-01-04 MED ORDER — CHLORHEXIDINE GLUCONATE 0.12 % MT SOLN
15.0000 mL | Freq: Two times a day (BID) | OROMUCOSAL | Status: DC
  Administered 2014-01-04 – 2014-01-08 (×7): 15 mL via OROMUCOSAL
  Filled 2014-01-04 (×10): qty 15

## 2014-01-04 MED ORDER — CLINDAMYCIN PHOSPHATE 600 MG/50ML IV SOLN
600.0000 mg | Freq: Three times a day (TID) | INTRAVENOUS | Status: DC
  Administered 2014-01-04 – 2014-01-06 (×7): 600 mg via INTRAVENOUS
  Filled 2014-01-04 (×9): qty 50

## 2014-01-04 MED ORDER — CEFAZOLIN SODIUM 1-5 GM-% IV SOLN
1.0000 g | INTRAVENOUS | Status: DC
  Administered 2014-01-04 – 2014-01-16 (×13): 1 g via INTRAVENOUS
  Filled 2014-01-04 (×14): qty 50

## 2014-01-04 MED ORDER — MORPHINE SULFATE 2 MG/ML IJ SOLN
1.0000 mg | INTRAMUSCULAR | Status: DC | PRN
  Administered 2014-01-04 – 2014-01-06 (×4): 2 mg via INTRAVENOUS
  Administered 2014-01-06: 1 mg via INTRAVENOUS
  Administered 2014-01-07 – 2014-01-09 (×12): 2 mg via INTRAVENOUS
  Filled 2014-01-04 (×17): qty 1

## 2014-01-04 MED ORDER — SODIUM BICARBONATE 8.4 % IV SOLN
INTRAVENOUS | Status: DC
  Administered 2014-01-04 (×2): via INTRAVENOUS
  Filled 2014-01-04 (×4): qty 100

## 2014-01-04 MED ORDER — DARBEPOETIN ALFA-POLYSORBATE 100 MCG/0.5ML IJ SOLN
100.0000 ug | INTRAMUSCULAR | Status: DC
  Administered 2014-01-05: 100 ug via SUBCUTANEOUS
  Filled 2014-01-04 (×4): qty 0.5

## 2014-01-04 NOTE — Care Management Note (Addendum)
    Page 1 of 1   01/19/2014     12:09:26 PM CARE MANAGEMENT NOTE 01/19/2014  Patient:  Tricia BeathGIBSON,Razan E   Account Number:  [id number]  Date Initiated:  01/04/2014  Documentation initiated by:  Junius CreamerWELL,DEBBIE  Subjective/Objective Assessment:   adm w cellulitis     Action/Plan:   lives w fam, pcp dr w elkins   Anticipated DC Date:  01/19/2014   Anticipated DC Plan:  SKILLED NURSING FACILITY  In-house referral  Clinical Social Worker      DC Planning Services  CM consult      Choice offered to / List presented to:             Status of service:  Completed, signed off Medicare Important Message given?  YES (If response is "NO", the following Medicare IM given date fields will be blank) Date Medicare IM given:  01/05/2014 Medicare IM given by:  Junius CreamerWELL,DEBBIE Date Additional Medicare IM given:  01/19/2014 Additional Medicare IM given by:  Letha CapeEBORAH Zackrey Dyar  Discharge Disposition:  SKILLED NURSING FACILITY  Per UR Regulation:  Reviewed for med. necessity/level of care/duration of stay  If discussed at Long Length of Stay Meetings, dates discussed:   01/07/2014  01/19/2014    Comments:

## 2014-01-04 NOTE — Progress Notes (Signed)
Orthopedic Tech Progress Note Patient Details:  Tricia BeathMargie E [name] Dec 26, 1933 [id number]  Ortho Devices Type of Ortho Device: Arm sling Ortho Device/Splint Location: kuzman sling Ortho Device/Splint Interventions: Ordered;Application   Jennye MoccasinHughes, [name] 01/04/2014, 5:57 PM

## 2014-01-04 NOTE — Consult Note (Signed)
Reason for Consult:cellulitis left upper extremity Referring Physician: Hospitalists  Tricia [name] is an 80 y.o. female.  HPI: the patient is a 80 year old female who is 3 days status post admission with cellulitis of the left upper extremity.  She initially had a few vesicles on the arm which coalesced and she began having significant cellulitis.  She was admitted and placed on antibiotics and had a CAT scan which showed no evidence of abscess.  We are consulted because of persistent cellulitis with some blisters on the skin.  Past Medical History  Diagnosis Date  . Hypertension   . Hyperlipidemia   . Coronary artery disease     post CABG in 1999, with 2-vessel disease -- - LIMA graft to the LAD and a left radial artery graft to the PDA   . Old inferior wall myocardial infarction 1999  . 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 Tobacco dependency   . CKD (chronic kidney disease) stage 4, GFR 15-29 ml/min     followed by Dr. Marval Regal  . Diastolic heart failure   . Aortic stenosis     Mild  . Type 2 diabetes mellitus with vascular disease   . CKD (chronic kidney disease), stage IV   . Hypertensive heart disease   . Anxiety   . Depression   . Cataract   . Ovarian cyst     Past Surgical History  Procedure Laterality Date  . Partial hysterectomy    . Total vaginal hysterectomy    . 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 Iliac vein angioplasty / stenting  08/10/2010     Left common iliac stenting  . Coronary artery bypass graft  01/26/1998    Est. EF of 40-45% -- with 2-vessel disease -- LIMA graft to the LAD and a left radial artery graft to the PDA   . Cardiac catheterization      Family History  Problem Relation Age of Onset  . Heart attack Father 35    Social History:  reports that she has been smoking Cigarettes.  She has a 30 pack-year smoking history. She has never used smokeless tobacco. She reports that she does not drink alcohol or use  illicit drugs.  Allergies: No Known Allergies  Medications: I have reviewed the patient's current medications.  Results for orders placed during the hospital encounter of 01/01/14 (from the past 48 hour(s))  GLUCOSE, CAPILLARY     Status: Abnormal   Collection Time    01/02/14  5:23 PM      Result Value Ref Range   Glucose-Capillary 107 (*) 70 - 99 mg/dL  GLUCOSE, CAPILLARY     Status: Abnormal   Collection Time    01/02/14  9:15 PM      Result Value Ref Range   Glucose-Capillary 112 (*) 70 - 99 mg/dL  CBC     Status: Abnormal   Collection Time    01/03/14  2:35 AM      Result Value Ref Range   WBC 5.9  4.0 - 10.5 K/uL   RBC 2.93 (*) 3.87 - 5.11 MIL/uL   Hemoglobin 8.9 (*) 12.0 - 15.0 g/dL   HCT 27.8 (*) 36.0 - 46.0 %   MCV 94.9  78.0 - 100.0 fL   MCH 30.4  26.0 - 34.0 pg   MCHC 32.0  30.0 - 36.0 g/dL   RDW 14.0  11.5 - 15.5 %   Platelets  157  150 - 400 K/uL  BASIC METABOLIC PANEL     Status: Abnormal   Collection Time    01/03/14  2:35 AM      Result Value Ref Range   Sodium 136 (*) 137 - 147 mEq/[name]   Potassium 4.9  3.7 - 5.3 mEq/[name]   Chloride 101  96 - 112 mEq/[name]   CO2 16 (*) 19 - 32 mEq/[name]   Glucose, Bld 86  70 - 99 mg/dL   BUN 77 (*) 6 - 23 mg/dL   Creatinine, Ser 4.61 (*) 0.50 - 1.10 mg/dL   Calcium 8.6  8.4 - 10.5 mg/dL   GFR calc non Af Amer 8 (*) >90 mL/min   GFR calc Af Amer 10 (*) >90 mL/min   Comment: (NOTE)     The eGFR has been calculated using the CKD EPI equation.     This calculation has not been validated in all clinical situations.     eGFR's persistently <90 mL/min signify possible Chronic Kidney     Disease.  GLUCOSE, CAPILLARY     Status: None   Collection Time    01/03/14  7:44 AM      Result Value Ref Range   Glucose-Capillary 79  70 - 99 mg/dL  GLUCOSE, CAPILLARY     Status: Abnormal   Collection Time    01/03/14 11:31 AM      Result Value Ref Range   Glucose-Capillary 101 (*) 70 - 99 mg/dL  GLUCOSE, CAPILLARY     Status: None    Collection Time    01/03/14  4:31 PM      Result Value Ref Range   Glucose-Capillary 89  70 - 99 mg/dL  GLUCOSE, CAPILLARY     Status: None   Collection Time    01/03/14  9:26 PM      Result Value Ref Range   Glucose-Capillary 74  70 - 99 mg/dL  GLUCOSE, CAPILLARY     Status: Abnormal   Collection Time    01/03/14 11:18 PM      Result Value Ref Range   Glucose-Capillary 113 (*) 70 - 99 mg/dL  CBC     Status: Abnormal   Collection Time    01/04/14  2:17 AM      Result Value Ref Range   WBC 8.9  4.0 - 10.5 K/uL   RBC 2.76 (*) 3.87 - 5.11 MIL/uL   Hemoglobin 8.3 (*) 12.0 - 15.0 g/dL   HCT 25.7 (*) 36.0 - 46.0 %   MCV 93.1  78.0 - 100.0 fL   MCH 30.1  26.0 - 34.0 pg   MCHC 32.3  30.0 - 36.0 g/dL   RDW 14.0  11.5 - 15.5 %   Platelets 171  150 - 400 K/uL  BASIC METABOLIC PANEL     Status: Abnormal   Collection Time    01/04/14  2:17 AM      Result Value Ref Range   Sodium 134 (*) 137 - 147 mEq/[name]   Potassium 4.8  3.7 - 5.3 mEq/[name]   Chloride 102  96 - 112 mEq/[name]   CO2 15 (*) 19 - 32 mEq/[name]   Glucose, Bld 87  70 - 99 mg/dL   BUN 77 (*) 6 - 23 mg/dL   Creatinine, Ser 4.72 (*) 0.50 - 1.10 mg/dL   Calcium 8.4  8.4 - 10.5 mg/dL   GFR calc non Af Amer 8 (*) >90 mL/min   GFR calc Af Amer 9 (*) >  90 mL/min   Comment: (NOTE)     The eGFR has been calculated using the CKD EPI equation.     This calculation has not been validated in all clinical situations.     eGFR's persistently <90 mL/min signify possible Chronic Kidney     Disease.  GLUCOSE, CAPILLARY     Status: None   Collection Time    01/04/14  7:23 AM      Result Value Ref Range   Glucose-Capillary 77  70 - 99 mg/dL  GLUCOSE, CAPILLARY     Status: Abnormal   Collection Time    01/04/14 11:36 AM      Result Value Ref Range   Glucose-Capillary 115 (*) 70 - 99 mg/dL  GLUCOSE, CAPILLARY     Status: Abnormal   Collection Time    01/04/14  4:31 PM      Result Value Ref Range   Glucose-Capillary 123 (*) 70 - 99 mg/dL    No  results found.  ROS ROS: I have reviewed the patient's review of systems thoroughly and there are no positive responses as relates to the HPI. Exam: Blood pressure 113/30, pulse 76, temperature 98.1 F (36.7 C), temperature source Oral, resp. rate 12, height 5' 8"  (1.727 m), weight 164 lb 3.9 oz (74.5 kg), SpO2 98.00%. Physical Exam Well-developed well-nourished patient in no acute distress. Alert and oriented x3 HEENT:within normal limits Cardiac: Regular rate and rhythm Pulmonary: Lungs clear to auscultation Abdomen: Soft and nontender.  Normal active bowel sounds  Musculoskeletal: (left upper extremity has significant cellulitis with multiple areas of blistering.  There's some mild necrotic tissue just above the elbow.  She has weeping significantly over the forearm.  She has no pain and full range of motion of her upper extremity and fingers. Assessment/Plan: 80 year old female with known left upper extremity cellulitis and likely epidermal lysis from toxins from what I suspect is a staph or strep infection.  Given that she has full finger motion without pain on active or passive motion I don't think deep infection is likely.  At this point I think observation is the most appropriate course of action.  I will also place her in a hand foam elevation bag to see if we can get some of the swelling down.we will continue to follow her but for now do not feel that surgical intervention is warranted.  Dressing changes every other day are probably appropriate.  Tricia [name],Tricia [name] 01/04/2014, 4:54 PM

## 2014-01-04 NOTE — Progress Notes (Signed)
16 fr urinary catheter inserted after foley huddle with 5 RNS,  using sterile technique with 2 RNs present.  Dr. Ilsa IhaE. Zamoro notified of urine cloudy with sediment and mucous threads.  Pt was diagnosed with UTI on admission.

## 2014-01-04 NOTE — Progress Notes (Addendum)
Agree with above, doesn't appear to have anything to drain, would cont abx conservative care, consider repeat imaging

## 2014-01-04 NOTE — Progress Notes (Signed)
[facility] PROGRESS NOTE  Tricia [name] OZH:[id number] DOB: Apr 01, 1934 DOA: 01/01/2014 PCP: Kaleen Mask, MD  Assessment/Plan: 1. Sepsis -Present on admission, evidence by leukopenia with white count of 3000, presence of bands, temperature of 101.1, respiratory rate of 27, having positive blood cultures -Source of infection likely to be left upper extremity cellulitis.  -Left upper extremity appearing better -Continue supportive care, IV fluids, IV antimicrobial therapy -Microbiology reporting that blood cultures positive for Group A Strep x 2 sets, with susceptibility testing pending.  Repeat blood cultures drawn on 01/02/2014.   2.  Left upper extremity cellulitis -CT scan of left upper extremity showing severe diffuse changes consistent with cellulitis.  -Will continue IV AB converage with Vanc and Zosyn  -Wound care consulted and Surgery consulted -Blood cultures growing Group A Strep  3.  Acute on Chronic Renal Failure -Patient with history of stage IV CKD, baseline creatinine near 3.4.  -AM lab work showing gradual increase in creatinine, 4.72 this morning despite receiving IV fluids  -Will stop NS, start Bicarb gtt -Dr Kathrene Bongo of Nephrology consulted    4. Chronic diastolic CHF -Compensated.  -Diuretics stopped on admission.  -Follow volume status closely as she is getting IV fluids  5.  Metabolic Acidosis -Bicab at 15, likely related to renal failure. Will start bicarb gtt.   6. UTI -On broad spectrum AB therapy, will follow up on urine cultures  7. Type 2 diabetes Mellitus -Continue accuchecks ac and hs  Code Status: Full Family Communication: Spoke with her 2 son's  Disposition Plan: Continue IV fluids, IV AB's   Consultants:  Surgery   Antibiotics:  Vanc  Zosyn  HPI/Subjective: Patient seems more lethargic and confused. Arm looks about the sam from yesterday. Continues to have min PO intake.   Objective: Filed Vitals:   01/04/14 0719  BP: 172/53  Pulse:   Temp: 97.8 F (36.6 C)  Resp: 17    Intake/Output Summary (Last 24 hours) at 01/04/14 0812 Last data filed at 01/04/14 0600  Gross per 24 hour  Intake 2973.33 ml  Output    425 ml  Net 2548.33 ml   Filed Weights   01/02/14 0500 01/03/14 0312 01/04/14 0300  Weight: 72.4 kg (159 lb 9.8 oz) 73.4 kg (161 lb 13.1 oz) 74.5 kg (164 lb 3.9 oz)    Exam:   General:  Patient is awake and alert, seem a little better today  Cardiovascular: Regular rate and rhythm, normal S1S2  Respiratory: Clear to auscultation, normal inspiratory effort  Abdomen: Soft nontender nondistended  Musculoskeletal: There is some decrease in swelling of left upper extremity, there is ongoing weeping with blistering. She has sensation intake in her hands.   Data Reviewed: Basic Metabolic Panel:  Recent Labs Lab 01/01/14 1339 01/01/14 1416 01/01/14 2023 01/02/14 0316 01/03/14 0235 01/04/14 0217  NA 138 138  --  137 136* 134*  K 5.0 4.7  --  5.0 4.9 4.8  CL 103 106  --  101 101 102  CO2 19  --   --  18* 16* 15*  GLUCOSE 97 96  --  102* 86 87  BUN 53* 47*  --  64* 77* 77*  CREATININE 3.96* 4.50* 4.11* 4.29* 4.61* 4.72*  CALCIUM 8.9  --   --  8.6 8.6 8.4   Liver Function Tests:  Recent Labs Lab 01/01/14 1339 01/02/14 0316  AST 34 34  ALT 12 12  ALKPHOS 42 40  BILITOT 0.4 0.2*  PROT 6.3 5.8*  ALBUMIN 2.6* 2.2*   No results found for this basename: LIPASE, AMYLASE,  in the last 168 hours No results found for this basename: AMMONIA,  in the last 168 hours CBC:  Recent Labs Lab 01/01/14 1339 01/01/14 1416 01/01/14 2023 01/02/14 0316 01/03/14 0235 01/04/14 0217  WBC 3.0*  --  2.4* 4.0 5.9 8.9  NEUTROABS 2.4  --   --   --   --   --   HGB 10.3* 11.6* 9.7* 9.3* 8.9* 8.3*  HCT 32.0* 34.0* 30.6* 29.5* 27.8* 25.7*  MCV 97.0  --  96.5 94.6 94.9 93.1  PLT 145*  --  158 142* 157 171   Cardiac Enzymes: No results found for this basename: CKTOTAL, CKMB,  CKMBINDEX, TROPONINI,  in the last 168 hours BNP (last 3 results)  Recent Labs  07/28/13 1650  PROBNP 32203.0*   CBG:  Recent Labs Lab 01/03/14 1131 01/03/14 1631 01/03/14 2126 01/03/14 2318 01/04/14 0723  GLUCAP 101* 89 74 113* 77    Recent Results (from the past 240 hour(s))  URINE CULTURE     Status: None   Collection Time    01/01/14 12:34 PM      Result Value Ref Range Status   Specimen Description URINE, CLEAN CATCH   Final   Special Requests NONE   Final   Culture  Setup Time     Final   Value: 01/01/2014 13:10     Performed at [facility]   Colony Count     Final   Value: >=100,000 COLONIES/ML     Performed at [facility]   Culture     Final   Value: STAPHYLOCOCCUS AUREUS     Note: RIFAMPIN AND GENTAMICIN SHOULD NOT BE USED AS SINGLE DRUGS FOR TREATMENT OF STAPH INFECTIONS.     Performed at [facility]   Report Status 01/03/2014 FINAL   Final   Organism ID, Bacteria STAPHYLOCOCCUS AUREUS   Final  CULTURE, BLOOD (ROUTINE X 2)     Status: None   Collection Time    01/01/14  1:50 PM      Result Value Ref Range Status   Specimen Description BLOOD ARM RIGHT   Final   Special Requests     Final   Value: BOTTLES DRAWN AEROBIC AND ANAEROBIC BLUE 10CC RED 5CC   Culture  Setup Time     Final   Value: 01/01/2014 22:00     Performed at [facility]   Culture     Final   Value: GROUP A STREP (S.PYOGENES) ISOLATED     Note: Gram Stain Report Called to,Read Back By and Verified With: [facility] 01/02/14 @ 12:27PM BY RUSCOE A. CRITICAL RESULT CALLED TO, READ BACK BY AND VERIFIED WITH: STUART WATERS 01/03/14 @ 9:25AM BY RUSCOE A.     Performed at [facility]   Report Status PENDING   Incomplete  CULTURE, BLOOD (ROUTINE X 2)     Status: None   Collection Time    01/01/14  2:00 PM      Result Value Ref Range Status   Specimen Description BLOOD HAND RIGHT   Final   Special Requests BOTTLES DRAWN AEROBIC ONLY 10CC   Final    Culture  Setup Time     Final   Value: 01/01/2014 22:01     Performed at [facility]   Culture     Final   Value: GROUP A STREP (S.PYOGENES) ISOLATED  Note: Gram Stain Report Called to,Read Back By and Verified With: [facility] LAFFARD 01/02/14 @ 12:27PM BY RUSCOE A. CRITICAL RESULT CALLED TO, READ BACK BY AND VERIFIED WITH: STUART WATERS 01/03/14 @ 9:25AM BY RUSCOE A.     Performed at [facility]   Report Status PENDING   Incomplete  MRSA PCR SCREENING     Status: None   Collection Time    01/01/14  7:00 PM      Result Value Ref Range Status   MRSA by PCR NEGATIVE  NEGATIVE Final   Comment:            The GeneXpert MRSA Assay (FDA     approved for NASAL specimens     only), is one component of a     comprehensive MRSA colonization     surveillance program. It is not     intended to diagnose MRSA     infection nor to guide or     monitor treatment for     MRSA infections.  CULTURE, BLOOD (ROUTINE X 2)     Status: None   Collection Time    01/01/14  8:16 PM      Result Value Ref Range Status   Specimen Description BLOOD RIGHT HAND   Final   Special Requests BOTTLES DRAWN AEROBIC ONLY 2CC   Final   Culture  Setup Time     Final   Value: 01/02/2014 00:46     Performed at [facility]   Culture     Final   Value:        BLOOD CULTURE RECEIVED NO GROWTH TO DATE CULTURE WILL BE HELD FOR 5 DAYS BEFORE ISSUING A FINAL NEGATIVE REPORT     Performed at [facility]   Report Status PENDING   Incomplete  CULTURE, BLOOD (ROUTINE X 2)     Status: None   Collection Time    01/01/14  8:23 PM      Result Value Ref Range Status   Specimen Description BLOOD RIGHT ARM   Final   Special Requests     Final   Value: BOTTLES DRAWN AEROBIC AND ANAEROBIC 10CC AER,6CC ANA   Culture  Setup Time     Final   Value: 01/02/2014 00:46     Performed at [facility]   Culture     Final   Value:        BLOOD CULTURE RECEIVED NO GROWTH TO DATE CULTURE WILL BE HELD FOR  5 DAYS BEFORE ISSUING A FINAL NEGATIVE REPORT     Performed at [facility]   Report Status PENDING   Incomplete  CULTURE, BLOOD (ROUTINE X 2)     Status: None   Collection Time    01/02/14  2:25 PM      Result Value Ref Range Status   Specimen Description BLOOD RIGHT HAND   Final   Special Requests BOTTLES DRAWN AEROBIC ONLY 5CC   Final   Culture  Setup Time     Final   Value: 01/02/2014 22:28     Performed at [facility]   Culture     Final   Value:        BLOOD CULTURE RECEIVED NO GROWTH TO DATE CULTURE WILL BE HELD FOR 5 DAYS BEFORE ISSUING A FINAL NEGATIVE REPORT     Performed at [facility]   Report Status PENDING   Incomplete  CULTURE, BLOOD (ROUTINE X 2)  Status: None   Collection Time    01/02/14  2:40 PM      Result Value Ref Range Status   Specimen Description BLOOD RIGHT ARM   Final   Special Requests BOTTLES DRAWN AEROBIC AND ANAEROBIC 10CC EACH   Final   Culture  Setup Time     Final   Value: 01/02/2014 22:28     Performed at [facility]   Culture     Final   Value:        BLOOD CULTURE RECEIVED NO GROWTH TO DATE CULTURE WILL BE HELD FOR 5 DAYS BEFORE ISSUING A FINAL NEGATIVE REPORT     Performed at [facility]   Report Status PENDING   Incomplete     Studies: No results found.  Scheduled Meds: . aspirin EC  81 mg Oral Daily  . docusate sodium  100 mg Oral BID  . heparin  5,000 Units Subcutaneous 3 times per day  . hydrALAZINE  75 mg Oral TID  . insulin aspart  0-15 Units Subcutaneous TID WC  . insulin aspart  0-5 Units Subcutaneous QHS  . piperacillin-tazobactam (ZOSYN)  IV  2.25 g Intravenous 4 times per day  . piperacillin-tazobactam  3.375 g Intravenous Once  . polyethylene glycol  17 g Oral Daily  . sodium chloride  3 mL Intravenous Q12H  . vancomycin  1,000 mg Intravenous Q48H   Continuous Infusions: . sodium chloride 100 mL (01/03/14 2041)    Principal Problem:   Cellulitis Active Problems:    Sepsis   Acute on chronic renal failure   Diabetes mellitus with peripheral vascular disease   Hypertensive heart disease   Tobacco dependency   Chronic diastolic CHF (congestive heart failure)    Time spent: 45 min    Jeralyn BennettZAMORA, EZEQUIEL  [facility] Pager [phone]. If 7PM-7AM, please contact night-coverage at [email or web address], password [email or web address] 01/04/2014, 8:12 AM  LOS: 3 days

## 2014-01-04 NOTE — Consult Note (Signed)
[facility] Renal Consultation Note  Requesting MD: Dr. Coralyn Pear Indication for Consultation: A on CKD  HPI:  Tricia [name] is a 80 y.o. female with past medical history significant for diabetes mellitus, hypertension hyperlipidemia . PAD,  coronary artery disease as well as stage IV CK D. reportedly followed by Dr. Marval Regal at [facility] Her baseline creatinine appears to be in the low to mid threes.  She presented to the emergency department on 01/01/2014 with complaints of weakness and left upper extremity swelling with fever. She was diagnosed with an upper extremity cellulitis and placed on Zosyn and vancomycin.   There has been no extreme hypotension. Urine output has not been strictly recorded but does seem decreased - only recorded as 675 on the 20th, only 425 cc on the 21st of none recorded today.  She has not received any other nephrotoxic medications.  Initial urinalysis did show 21-50 WBCs many bacteria and greater than 300 protein. Urine culture did show MSSA.  She is currently on Ancef, clindamycin as well as Zosyn. Clinically, she appears to be worsening. This is in terms of her arm as well as her renal function. Creatinine has steadily worsened over the last 3 days and is now at a level of 4.72.  An MRI of her arm has been ordered. It should also be noted that patient is more confused.   Creatinine, Ser  Date/Time Value Ref Range Status  01/04/2014  2:17 AM 4.72* 0.50 - 1.10 mg/dL Final  01/03/2014  2:35 AM 4.61* 0.50 - 1.10 mg/dL Final  01/02/2014  3:16 AM 4.29* 0.50 - 1.10 mg/dL Final  01/01/2014  8:23 PM 4.11* 0.50 - 1.10 mg/dL Final  01/01/2014  2:16 PM 4.50* 0.50 - 1.10 mg/dL Final  01/01/2014  1:39 PM 3.96* 0.50 - 1.10 mg/dL Final  08/03/2013  3:28 AM 3.22* 0.50 - 1.10 mg/dL Final  08/02/2013  4:50 AM 3.40* 0.50 - 1.10 mg/dL Final  08/01/2013  4:03 AM 3.32* 0.50 - 1.10 mg/dL Final  07/31/2013  3:20 AM 3.41* 0.50 - 1.10 mg/dL Final  07/30/2013  2:41 AM  3.02* 0.50 - 1.10 mg/dL Final  07/29/2013 11:45 AM 2.86* 0.50 - 1.10 mg/dL Final  07/29/2013  2:27 AM 2.78* 0.50 - 1.10 mg/dL Final  07/28/2013  8:16 PM 2.65* 0.50 - 1.10 mg/dL Final  07/28/2013  4:51 PM 2.59* 0.50 - 1.10 mg/dL Final  11/02/2011  5:44 PM 2.80* 0.50 - 1.10 mg/dL Final  05/02/2011  5:30 AM 2.94* 0.50 - 1.10 mg/dL Final  05/01/2011  8:53 AM 3.25* 0.50 - 1.10 mg/dL Final  05/01/2011  4:57 AM 3.15* 0.50 - 1.10 mg/dL Final  04/30/2011  5:05 AM 2.99* 0.50 - 1.10 mg/dL Final  04/29/2011  6:05 AM 2.87* 0.50 - 1.10 mg/dL Final  04/28/2011  5:00 AM 2.67* 0.50 - 1.10 mg/dL Final  04/27/2011  7:01 AM 2.31* 0.50 - 1.10 mg/dL Final  04/26/2011  9:45 PM 2.18* 0.50 - 1.10 mg/dL Final  04/26/2011  2:40 PM 2.13* 0.50 - 1.10 mg/dL Final  08/11/2010  5:18 AM 2.79* 0.4 - 1.2 mg/dL Final  08/10/2010  6:05 AM 2.76* 0.4 - 1.2 mg/dL Final  08/09/2010  4:14 PM 2.78* 0.4 - 1.2 mg/dL Final     PMHx:   Past Medical History  Diagnosis Date  . Hypertension   . Hyperlipidemia   . Coronary artery disease     post CABG in 1999, with 2-vessel disease -- - LIMA graft to the LAD and  a left radial artery graft to the PDA   . Old inferior wall myocardial infarction 1999  . 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 Tobacco dependency   . CKD (chronic kidney disease) stage 4, GFR 15-29 ml/min     followed by Dr. Marval Regal  . Diastolic heart failure   . Aortic stenosis     Mild  . Type 2 diabetes mellitus with vascular disease   . CKD (chronic kidney disease), stage IV   . Hypertensive heart disease   . Anxiety   . Depression   . Cataract   . Ovarian cyst     Past Surgical History  Procedure Laterality Date  . Partial hysterectomy    . Total vaginal hysterectomy    . 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 Iliac vein angioplasty / stenting  08/10/2010     Left common iliac stenting  . Coronary artery bypass graft  01/26/1998    Est. EF of 40-45% -- with 2-vessel disease -- LIMA  graft to the LAD and a left radial artery graft to the PDA   . Cardiac catheterization      Family Hx:  Family History  Problem Relation Age of Onset  . Heart attack Father 22    Social History:  reports that she has been smoking Cigarettes.  She has a 30 pack-year smoking history. She has never used smokeless tobacco. She reports that she does not drink alcohol or use illicit drugs.  Allergies: No Known Allergies  Medications: Prior to Admission medications   Medication Sig Start Date End Date Taking? Authorizing Provider  aspirin EC 81 MG EC tablet Take 1 tablet (81 mg total) by mouth daily. 08/03/13  Yes Brittainy Simmons, PA-C  calcitRIOL (ROCALTROL) 0.25 MCG capsule Take 0.25 mcg by mouth 3 (three) times a week. On Monday, Wednesday, and Friday   Yes Historical Provider, MD  clonazePAM (KLONOPIN) 2 MG tablet Take 2 mg by mouth 2 (two) times daily.   Yes Historical Provider, MD  furosemide (LASIX) 40 MG tablet Take 1 tablet (40 mg total) by mouth 2 (two) times daily. 10/22/12  Yes Peter M Martinique, MD  hydrALAZINE (APRESOLINE) 50 MG tablet Take 1.5 tablets (75 mg total) by mouth 3 (three) times daily. 08/03/13  Yes Brittainy Simmons, PA-C  labetalol (NORMODYNE) 200 MG tablet Take 1 tablet (200 mg total) by mouth 2 (two) times daily. 08/03/13  Yes Brittainy Rosita Fire, PA-C  propranolol (INDERAL) 80 MG tablet Take 40 mg by mouth 2 (two) times daily as needed (for tremor / hypertension).   Yes Historical Provider, MD  Trospium Chloride 60 MG CP24 Take 60 mg by mouth daily.  07/23/13  Yes Historical Provider, MD  vitamin B-12 (CYANOCOBALAMIN) 50 MCG tablet Take 50 mcg by mouth daily.   Yes Historical Provider, MD    I have reviewed the patient's current medications.  Labs:  Results for orders placed during the hospital encounter of 01/01/14 (from the past 48 hour(s))  CULTURE, BLOOD (ROUTINE X 2)     Status: None   Collection Time    01/02/14  2:25 PM      Result Value Ref Range   Specimen  Description BLOOD RIGHT HAND     Special Requests BOTTLES DRAWN AEROBIC ONLY 5CC     Culture  Setup Time       Value: 01/02/2014 22:28     Performed at [facility]  Culture       Value:        BLOOD CULTURE RECEIVED NO GROWTH TO DATE CULTURE WILL BE HELD FOR 5 DAYS BEFORE ISSUING A FINAL NEGATIVE REPORT     Performed at [facility]   Report Status PENDING    CULTURE, BLOOD (ROUTINE X 2)     Status: None   Collection Time    01/02/14  2:40 PM      Result Value Ref Range   Specimen Description BLOOD RIGHT ARM     Special Requests BOTTLES DRAWN AEROBIC AND ANAEROBIC 10CC EACH     Culture  Setup Time       Value: 01/02/2014 22:28     Performed at [facility]   Culture       Value:        BLOOD CULTURE RECEIVED NO GROWTH TO DATE CULTURE WILL BE HELD FOR 5 DAYS BEFORE ISSUING A FINAL NEGATIVE REPORT     Performed at [facility]   Report Status PENDING    GLUCOSE, CAPILLARY     Status: Abnormal   Collection Time    01/02/14  5:23 PM      Result Value Ref Range   Glucose-Capillary 107 (*) 70 - 99 mg/dL  GLUCOSE, CAPILLARY     Status: Abnormal   Collection Time    01/02/14  9:15 PM      Result Value Ref Range   Glucose-Capillary 112 (*) 70 - 99 mg/dL  CBC     Status: Abnormal   Collection Time    01/03/14  2:35 AM      Result Value Ref Range   WBC 5.9  4.0 - 10.5 K/uL   RBC 2.93 (*) 3.87 - 5.11 MIL/uL   Hemoglobin 8.9 (*) 12.0 - 15.0 g/dL   HCT 27.8 (*) 36.0 - 46.0 %   MCV 94.9  78.0 - 100.0 fL   MCH 30.4  26.0 - 34.0 pg   MCHC 32.0  30.0 - 36.0 g/dL   RDW 14.0  11.5 - 15.5 %   Platelets 157  150 - 400 K/uL  BASIC METABOLIC PANEL     Status: Abnormal   Collection Time    01/03/14  2:35 AM      Result Value Ref Range   Sodium 136 (*) 137 - 147 mEq/L   Potassium 4.9  3.7 - 5.3 mEq/L   Chloride 101  96 - 112 mEq/L   CO2 16 (*) 19 - 32 mEq/L   Glucose, Bld 86  70 - 99 mg/dL   BUN 77 (*) 6 - 23 mg/dL   Creatinine, Ser 4.61 (*) 0.50 - 1.10  mg/dL   Calcium 8.6  8.4 - 10.5 mg/dL   GFR calc non Af Amer 8 (*) >90 mL/min   GFR calc Af Amer 10 (*) >90 mL/min   Comment: (NOTE)     The eGFR has been calculated using the CKD EPI equation.     This calculation has not been validated in all clinical situations.     eGFR's persistently <90 mL/min signify possible Chronic Kidney     Disease.  GLUCOSE, CAPILLARY     Status: None   Collection Time    01/03/14  7:44 AM      Result Value Ref Range   Glucose-Capillary 79  70 - 99 mg/dL  GLUCOSE, CAPILLARY     Status: Abnormal   Collection Time    01/03/14 11:31 AM  Result Value Ref Range   Glucose-Capillary 101 (*) 70 - 99 mg/dL  GLUCOSE, CAPILLARY     Status: None   Collection Time    01/03/14  4:31 PM      Result Value Ref Range   Glucose-Capillary 89  70 - 99 mg/dL  GLUCOSE, CAPILLARY     Status: None   Collection Time    01/03/14  9:26 PM      Result Value Ref Range   Glucose-Capillary 74  70 - 99 mg/dL  GLUCOSE, CAPILLARY     Status: Abnormal   Collection Time    01/03/14 11:18 PM      Result Value Ref Range   Glucose-Capillary 113 (*) 70 - 99 mg/dL  CBC     Status: Abnormal   Collection Time    01/04/14  2:17 AM      Result Value Ref Range   WBC 8.9  4.0 - 10.5 K/uL   RBC 2.76 (*) 3.87 - 5.11 MIL/uL   Hemoglobin 8.3 (*) 12.0 - 15.0 g/dL   HCT 25.7 (*) 36.0 - 46.0 %   MCV 93.1  78.0 - 100.0 fL   MCH 30.1  26.0 - 34.0 pg   MCHC 32.3  30.0 - 36.0 g/dL   RDW 14.0  11.5 - 15.5 %   Platelets 171  150 - 400 K/uL  BASIC METABOLIC PANEL     Status: Abnormal   Collection Time    01/04/14  2:17 AM      Result Value Ref Range   Sodium 134 (*) 137 - 147 mEq/L   Potassium 4.8  3.7 - 5.3 mEq/L   Chloride 102  96 - 112 mEq/L   CO2 15 (*) 19 - 32 mEq/L   Glucose, Bld 87  70 - 99 mg/dL   BUN 77 (*) 6 - 23 mg/dL   Creatinine, Ser 4.72 (*) 0.50 - 1.10 mg/dL   Calcium 8.4  8.4 - 10.5 mg/dL   GFR calc non Af Amer 8 (*) >90 mL/min   GFR calc Af Amer 9 (*) >90 mL/min    Comment: (NOTE)     The eGFR has been calculated using the CKD EPI equation.     This calculation has not been validated in all clinical situations.     eGFR's persistently <90 mL/min signify possible Chronic Kidney     Disease.  GLUCOSE, CAPILLARY     Status: None   Collection Time    01/04/14  7:23 AM      Result Value Ref Range   Glucose-Capillary 77  70 - 99 mg/dL  GLUCOSE, CAPILLARY     Status: Abnormal   Collection Time    01/04/14 11:36 AM      Result Value Ref Range   Glucose-Capillary 115 (*) 70 - 99 mg/dL     ROS:  Review of systems not obtained due to patient factors. but, she denies pain anywhere other than her arm. She denies nausea. She repeatedly says " help me".   Physical Exam: Filed Vitals:   01/04/14 1133  BP: 167/96  Pulse:   Temp: 98.2 F (36.8 C)  Resp: 15     General:  Chronically ill-appearing white female. She is arousable to stimulation. She repeatedly says "help me"  but is able to answer the occasional direct question she denies complaints other than her left arm .  HEENT: pupils are equal round reactive to light, extra motions are intact, mucous membranes are slightly dry  Neck: there is no significant jugular venous distention  Heart: regular rate and rhythm without murmur, gallop, or rub  Lungs: appears mostly clear  Abdomen: positive bowel sounds, soft nontender, nondistended  Extremities: really no lower extremity edema - status post amputation on the right Skin: warm and dry  Neuro: perseverating. Able at times to answer direct questions   Assessment/Plan: 80 year old white female with advanced CKD at baseline. She now has acute on chronic renal failure in the setting of a significant upper extremity cellulitis and multiple antibiotics as well as a UTI 1.Renal-  advanced CKD- stage 4 at baseline. Now with acute on chronic kidney disease in the setting of cellulitis/UTI/antibiotics.  I do not see any direct clear-cut insult. Her decreased  kidney function is likely based on this whole picture of infection/antibiotics/UTI. She is on an appropriate antibiotic for her urinary tract infection.  There is no obvious need for renal replacement therapy at this time. However, if trend continues she may need in the next 24-48 hours. I will continue to follow the trend  2. Hypertension/volume  -  is not hypotensive. Is on scheduled hydralazine and propranolol. Does not seem volume overloaded. Due to metabolic acidosis I am okay with giving her IV sodium bicarbonate right now. She has some urine in her Foley catheter so hopefully urine outputs will be able to be quantified.  3. ID- severe cellulitis of her left arm as well as urinary tract infection. Infectious disease is now involved. She is now on Ancef, Zosyn as well as clindamycin.  Surgical input has been obtained as well. MRI of the left upper extremity is pending.  4. Anemia  -  is likely multifactorial. We'll check iron stores and also start Aranesp.   Thank you for this consult.  I will continue to follow with you   Malaika Arnall A 01/04/2014, 1:39 PM

## 2014-01-04 NOTE — Progress Notes (Signed)
Follow up note  RN reporting patient remains encephalopathic, confused, lethargic. Continues to have severe arm pain. Dr Luiz BlareGraves of Orthopedic surgery was consulted as recommended by Surgery. He recommended reimaging her arm, MRI of left arm ordered. Also recommended an ID consultation. Dr Drue SecondSnider paged. Patient remains of Vanc and Zosyn

## 2014-01-04 NOTE — Progress Notes (Signed)
[facility] Progress Note     Subjective: Pt doing okay.  Pain in left arm.  Notes weakness compared to yesterday.  Not able to hold her arm up for me like she did yesterday.  Objective: Vital signs in last 24 hours: Temp:  [97.8 F (36.6 C)-98.6 F (37 C)] 98.2 F (36.8 C) (06/22 1133) Pulse Rate:  [75-76] 75 (06/21 2300) Resp:  [10-18] 15 (06/22 1133) BP: (138-174)/(35-96) 167/96 mmHg (06/22 1133) SpO2:  [93 %-97 %] 97 % (06/22 1133) Weight:  [164 lb 3.9 oz (74.5 kg)] 164 lb 3.9 oz (74.5 kg) (06/22 0300) Last BM Date: 01/01/14  Intake/Output from previous day: 06/21 0701 - 06/22 0700 In: 3933.3 [P.O.:960; I.V.:2123.3; IV Piggyback:850] Out: 425 [Urine:425] Intake/Output this shift: Total I/O In: 120 [P.O.:20; I.V.:100] Out: -    PE: Gen: Alert, NAD, pleasant  Skin: Erythema/edema from hand to mid humerus appears worse than yesterday. More epidermal blistering/bulla, some degree of skin ulceration is seen scattered throughout.  Significant weeping skin noted.  No apparent abscess pockets.           Lab Results:   Recent Labs  01/03/14 0235 01/04/14 0217  WBC 5.9 8.9  HGB 8.9* 8.3*  HCT 27.8* 25.7*  PLT 157 171   BMET  Recent Labs  01/03/14 0235 01/04/14 0217  NA 136* 134*  K 4.9 4.8  CL 101 102  CO2 16* 15*  GLUCOSE 86 87  BUN 77* 77*  CREATININE 4.61* 4.72*  CALCIUM 8.6 8.4   PT/INR No results found for this basename: LABPROT, INR,  in the last 72 hours CMP     Component Value Date/Time   NA 134* 01/04/2014 0217   K 4.8 01/04/2014 0217   CL 102 01/04/2014 0217   CO2 15* 01/04/2014 0217   GLUCOSE 87 01/04/2014 0217   BUN 77* 01/04/2014 0217   CREATININE 4.72* 01/04/2014 0217   CALCIUM 8.4 01/04/2014 0217   PROT 5.8* 01/02/2014 0316   ALBUMIN 2.2* 01/02/2014 0316   AST 34 01/02/2014 0316   ALT 12 01/02/2014 0316   ALKPHOS 40 01/02/2014 0316   BILITOT 0.2* 01/02/2014 0316   GFRNONAA 8* 01/04/2014 0217   GFRAA 9* 01/04/2014 0217    Lipase  No results found for this basename: lipase       Studies/Results: No results found.  Anti-infectives: Anti-infectives   Start     Dose/Rate Route Frequency Ordered Stop   01/03/14 2200  vancomycin (VANCOCIN) IVPB 1000 mg/200 mL premix     1,000 mg 200 mL/hr over 60 Minutes Intravenous Every 48 hours 01/01/14 1910     01/03/14 1600  vancomycin (VANCOCIN) IVPB 1000 mg/200 mL premix  Status:  Discontinued     1,000 mg 200 mL/hr over 60 Minutes Intravenous Every 48 hours 01/01/14 1513 01/01/14 1903   01/01/14 2359  piperacillin-tazobactam (ZOSYN) IVPB 2.25 g     2.25 g 100 mL/hr over 30 Minutes Intravenous 4 times per day 01/01/14 1729     01/01/14 1730  piperacillin-tazobactam (ZOSYN) IVPB 3.375 g     3.375 g 100 mL/hr over 30 Minutes Intravenous  Once 01/01/14 1729     01/01/14 1515  vancomycin (VANCOCIN) 1,500 mg in sodium chloride 0.9 % 500 mL IVPB     1,500 mg 250 mL/hr over 120 Minutes Intravenous  Once 01/01/14 1513 01/03/14 0700   01/01/14 1445  cefTRIAXone (ROCEPHIN) 1 g in dextrose 5 % 50 mL IVPB     1 g  100 mL/hr over 30 Minutes Intravenous  Once 01/01/14 1443 01/01/14 1544       Assessment/Plan Left upper extremity edema with cellulitis  Protein calorie malnutrition  Leukopenia/anemia  UTI  Acute on Chronic kidney injury  Hyperkalemia  PVD  H/o DM  H/o heart failure   Plan:  -Don't see much improvement from yesterday -Discussed with Dr. Dwain SarnaWakefield.  Would recommend you have orthopedics look at the patient.  We don't have much to offer the patient since there is no discernable abscess.  Would recommend a specialist given her arm weakness and worsening cellulitis. -Cont elevation, IV abx (Vancomycin & Zosyn Day #3), wound care -Will sign off     LOS: 3 days    DORT, MEGAN 01/04/2014, 12:09 PM Pager: [phone]

## 2014-01-05 ENCOUNTER — Encounter (HOSPITAL_COMMUNITY): Payer: Self-pay

## 2014-01-05 LAB — BASIC METABOLIC PANEL
BUN: 76 mg/dL — ABNORMAL HIGH (ref 6–23)
CALCIUM: 8.6 mg/dL (ref 8.4–10.5)
CO2: 19 mEq/L (ref 19–32)
CREATININE: 4.76 mg/dL — AB (ref 0.50–1.10)
Chloride: 100 mEq/L (ref 96–112)
GFR calc non Af Amer: 8 mL/min — ABNORMAL LOW (ref >90)
GFR, EST AFRICAN AMERICAN: 9 mL/min — AB (ref >90)
Glucose, Bld: 104 mg/dL — ABNORMAL HIGH (ref 70–99)
Potassium: 4.4 mEq/L (ref 3.7–5.3)
Sodium: 137 mEq/L (ref 137–147)

## 2014-01-05 LAB — GLUCOSE, CAPILLARY
GLUCOSE-CAPILLARY: 91 mg/dL (ref 70–99)
Glucose-Capillary: 119 mg/dL — ABNORMAL HIGH (ref 70–99)
Glucose-Capillary: 132 mg/dL — ABNORMAL HIGH (ref 70–99)
Glucose-Capillary: 100 mg/dL — ABNORMAL HIGH (ref 70–99)

## 2014-01-05 LAB — IRON AND TIBC
Iron: 22 ug/dL — ABNORMAL LOW (ref 42–135)
SATURATION RATIOS: 18 % — AB (ref 20–55)
TIBC: 119 ug/dL — AB (ref 250–470)
UIBC: 97 ug/dL — ABNORMAL LOW (ref 125–400)

## 2014-01-05 LAB — CBC
HCT: 24.9 % — ABNORMAL LOW (ref 36.0–46.0)
Hemoglobin: 8.1 g/dL — ABNORMAL LOW (ref 12.0–15.0)
MCH: 30 pg (ref 26.0–34.0)
MCHC: 32.5 g/dL (ref 30.0–36.0)
MCV: 92.2 fL (ref 78.0–100.0)
Platelets: 188 10*3/uL (ref 150–400)
RBC: 2.7 MIL/uL — ABNORMAL LOW (ref 3.87–5.11)
RDW: 14.2 % (ref 11.5–15.5)
WBC: 7.9 10*3/uL (ref 4.0–10.5)

## 2014-01-05 LAB — FERRITIN: Ferritin: 155 ng/mL (ref 10–291)

## 2014-01-05 MED ORDER — AMLODIPINE BESYLATE 10 MG PO TABS
10.0000 mg | ORAL_TABLET | Freq: Every day | ORAL | Status: DC
  Administered 2014-01-05 – 2014-01-19 (×15): 10 mg via ORAL
  Filled 2014-01-05 (×15): qty 1

## 2014-01-05 MED ORDER — ENSURE COMPLETE PO LIQD
237.0000 mL | Freq: Two times a day (BID) | ORAL | Status: DC
  Administered 2014-01-09 – 2014-01-10 (×3): 237 mL via ORAL

## 2014-01-05 MED ORDER — SODIUM CHLORIDE 0.9 % IV SOLN
INTRAVENOUS | Status: DC
  Administered 2014-01-05: 08:00:00 via INTRAVENOUS

## 2014-01-05 MED ORDER — SODIUM CHLORIDE 0.9 % IV SOLN
INTRAVENOUS | Status: DC
  Administered 2014-01-05: 10:00:00 via INTRAVENOUS

## 2014-01-05 MED ORDER — SODIUM CHLORIDE 0.9 % IV SOLN
INTRAVENOUS | Status: DC
  Administered 2014-01-05: 250 mL via INTRAVENOUS
  Administered 2014-01-08 – 2014-01-11 (×2): via INTRAVENOUS

## 2014-01-05 NOTE — Progress Notes (Signed)
INITIAL NUTRITION ASSESSMENT  DOCUMENTATION CODES Per approved criteria  -Not Applicable   INTERVENTION: Ensure Complete po BID, each supplement provides 350 kcal and 13 grams of protein RD to follow for nutrition care plan  NUTRITION DIAGNOSIS: Inadequate oral intake related to poor appetite as evidenced by son report   Goal: Pt to meet >/= 90% of their estimated nutrition needs   Monitor:  PO & supplemental intake, weight, labs, I/O's  Reason for Assessment: Low Braden  80 y.o. female  Admitting Dx: Cellulitis  ASSESSMENT: 80 y.o. female with a history of multiple comorbidities including DM, peripheral vascular disease, status post right AKA, stage IV CKD, chronic diastolic CHF; presented to the ER with complaints of generalized weakness, feeling poorly and left upper extremity swelling.   RD spoke with patient's son at bedside; reports pt's appetite very poor stating "she just doesn't want anything;" breakfast tray untouched on patient's sink area; PTA was eating well (3 meals per day); per son, patient has tried Ensure supplements in the past; amenable to RD ordering during hospitalization.  Low braden score places patient at risk for skin breakdown.  No muscle or subcutaneous fat depletion noticed.  Height: Ht Readings from Last 1 Encounters:  01/01/14 5\' 8"  (1.727 m)    Weight: Wt Readings from Last 1 Encounters:  01/05/14 160 lb 0.9 oz (72.6 kg)    Ideal Body Weight: 140 lb -- adjusted for AKA  % Ideal Body Weight: 114%  Wt Readings from Last 10 Encounters:  01/05/14 160 lb 0.9 oz (72.6 kg)  10/02/13 169 lb 12.8 oz (77.021 kg)  08/19/13 170 lb (77.111 kg)  08/03/13 168 lb 11.2 oz (76.522 kg)  04/09/13 181 lb (82.101 kg)  04/18/12 186 lb (84.369 kg)  10/19/11 173 lb (78.472 kg)  06/19/11 178 lb (80.74 kg)  05/22/11 179 lb (81.194 kg)  02/14/11 170 lb (77.111 kg)    Usual Body Weight: 169 lb  % Usual Body Weight: 95%  BMI: 27.4 kg/m2 -- adjusted  for AKA  Estimated Nutritional Needs: Kcal: 1800-2000 Protein: 90-100 gm Fluid: 1.8-2.0 L  Skin: Intact  Diet Order: Cardiac  EDUCATION NEEDS: -No education needs identified at this time   Intake/Output Summary (Last 24 hours) at 01/05/14 0926 Last data filed at 01/05/14 0800  Gross per 24 hour  Intake 2466.67 ml  Output   1085 ml  Net 1381.67 ml    Labs:   Recent Labs Lab 01/03/14 0235 01/04/14 0217 01/05/14 0245  NA 136* 134* 137  K 4.9 4.8 4.4  CL 101 102 100  CO2 16* 15* 19  BUN 77* 77* 76*  CREATININE 4.61* 4.72* 4.76*  CALCIUM 8.6 8.4 8.6  GLUCOSE 86 87 104*    CBG (last 3)   Recent Labs  01/04/14 1631 01/04/14 2147 01/05/14 0747  GLUCAP 123* 106* 119*    Scheduled Meds: . amLODipine  10 mg Oral Daily  . antiseptic oral rinse  15 mL Mouth Rinse q12n4p  . antiseptic oral rinse  15 mL Mouth Rinse BID  . aspirin EC  81 mg Oral Daily  .  ceFAZolin (ANCEF) IV  1 g Intravenous Q24H  . chlorhexidine  15 mL Mouth Rinse BID  . clindamycin (CLEOCIN) IV  600 mg Intravenous 3 times per day  . darbepoetin (ARANESP) injection - NON-DIALYSIS  100 mcg Subcutaneous Q Tue-1800  . docusate sodium  100 mg Oral BID  . heparin  5,000 Units Subcutaneous 3 times per day  . hydrALAZINE  75 mg Oral TID  . insulin aspart  0-15 Units Subcutaneous TID WC  . insulin aspart  0-5 Units Subcutaneous QHS  . polyethylene glycol  17 g Oral Daily  . sodium chloride  3 mL Intravenous Q12H    Continuous Infusions: . sodium chloride      Past Medical History  Diagnosis Date  . Hypertension   . Hyperlipidemia   . Coronary artery disease     post CABG in 1999, with 2-vessel disease -- - LIMA graft to the LAD and a left radial artery graft to the PDA   . Old inferior wall myocardial infarction 1999  . 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 Tobacco dependency   . CKD (chronic kidney disease) stage 4, GFR 15-29 ml/min     followed by Dr. Arrie Aranoladonato  .  Diastolic heart failure   . Aortic stenosis     Mild  . Type 2 diabetes mellitus with vascular disease   . CKD (chronic kidney disease), stage IV   . Hypertensive heart disease   . Anxiety   . Depression   . Cataract   . Ovarian cyst     Past Surgical History  Procedure Laterality Date  . Partial hysterectomy    . Total vaginal hysterectomy    . 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 Iliac vein angioplasty / stenting  08/10/2010     Left common iliac stenting  . Coronary artery bypass graft  01/26/1998    Est. EF of 40-45% -- with 2-vessel disease -- LIMA graft to the LAD and a left radial artery graft to the PDA   . Cardiac catheterization      Maureen ChattersKatie Freddye Cardamone, RD, LDN Pager #: [phone] After-Hours Pager #: [phone]

## 2014-01-05 NOTE — Progress Notes (Signed)
[facility] PROGRESS NOTE  Tricia [name] MHD:[id number] DOB: [birth year] DOA: 01/01/2014 PCP: Kaleen Mask, MD  Interim Summary Patient is a pleasant 80 year old female with multiple comorbidities that include type 2 diabetes mellitus, peripheral vascular disease, 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 She presented with complaints of left upper extremity pain, swelling, erythema, having an associated functional decline, generalized weakness, feeling ill. Patient found to have severe left upper extremity cellulitis, with initial lab work showing white count of 3000 with presence of bands, temperature 101.1, development of acute on chronic renal failure. Given severity of presentation a CT scan of her left upper extremity was obtained in the emergency room which revealed severe diffuse edematous changes consistent with severe cellulitis. She was initially started on broad-spectrum IV antibiotic therapy with vancomycin and Zosyn. General surgery was consulted. She showed little improvement over the following days, having further deterioration to kidney function as well as becoming more encephalopathic. Blood cultures obtained on 01/01/2014, back positive for group A streptococcus in both sets. Repeat blood cultures were obtained. General surgery recommended orthopedic surgery consultation. On 01/04/2014 patient was seen and evaluated by Dr. Luiz Blare of orthopedic surgery. He recommended ongoing IV antimicrobial therapy and did not feel surgical intervention was warranted at this time. Dr. Drue Second of infectious disease was consulted and recommended the discontinuation of vancomycin and Zosyn and starting Clindamycin and Ancef. With patient's deterioration to kidney function Dr. Kathrene Bongo of nephrology was consulted as well. Though initially we had planned  on re-imaging her arm, speaking with Dr. Luiz Blare, he did not recommend pursuing MRI. Plan to continue IV antibiotic therapy, close monitoring of left upper extremity, supportive care.                                                                             Assessment/Plan: 1. Sepsis -Present on admission, evidence by leukopenia with white count of 3000, presence of bands, temperature of 101.1, respiratory rate of 27, having positive blood cultures -Source of infection likely to be left upper extremity cellulitis.  -Left upper extremity appearing better -Continue supportive care, IV fluids, IV antimicrobial therapy -Microbiology reporting that blood cultures positive for Group A Strep x 2 sets  Repeat blood cultures drawn on 01/02/2014 showing no growth.  -ID consulted  2.  Left upper extremity cellulitis -CT scan of left upper extremity showing severe diffuse changes consistent with cellulitis.  -Will continue IV AB coverage, now on Clindamycin and Ancef -Wound care, General Surgery and Orthopedics consulted -Blood cultures growing Group A Strep  3.  Acute on Chronic Renal Failure -Patient with history of stage IV CKD, baseline creatinine near 3.4.  -AM lab work showing creatinine of 4/76 -Placed on bicard gtt, now BMP showing bicarb of 19. Will stop bicarb and switch to NS -Dr Kathrene Bongo of Nephrology consulted    4. Chronic diastolic CHF -Compensated.  -Diuretics stopped on admission.  -Follow volume status closely as she is getting IV fluids  5.  Metabolic Acidosis -Bicab at 15 on 01/04/2014, likely related to renal failure.  -bicarb gtt will be discontinued, as BMP showing bicarb of 19  6. UTI -On broad spectrum AB therapy, will follow up on urine cultures  7. Type 2 diabetes Mellitus -Continue accuchecks ac and hs  Code Status: Full Family Communication: Spoke with her 2 son's  Disposition Plan: Continue IV fluids, IV  AB's   Consultants:  Surgery   Antibiotics:  Vanc  Zosyn  HPI/Subjective: Patient seems more lethargic and confused. Arm looks about the sam from yesterday. Continues to have min PO intake.   Objective: Filed Vitals:   01/05/14 0744  BP: 169/61  Pulse:   Temp: 98 F (36.7 C)  Resp: 13    Intake/Output Summary (Last 24 hours) at 01/05/14 0820 Last data filed at 01/05/14 0400  Gross per 24 hour  Intake 2486.67 ml  Output    910 ml  Net 1576.67 ml   Filed Weights   01/03/14 0312 01/04/14 0300 01/05/14 0500  Weight: 73.4 kg (161 lb 13.1 oz) 74.5 kg (164 lb 3.9 oz) 72.6 kg (160 lb 0.9 oz)    Exam:   General:  Patient is lethargic, confused, can not follow commands  Cardiovascular: Regular rate and rhythm, normal S1S2  Respiratory: Clear to auscultation, normal inspiratory effort  Abdomen: Soft nontender nondistended  Musculoskeletal: There is some decrease in swelling of left upper extremity, there is ongoing weeping with blistering, overall seems a little better from yesterday  Data Reviewed: Basic Metabolic Panel:  Recent Labs Lab 01/01/14 1339 01/01/14 1416 01/01/14 2023 01/02/14 0316 01/03/14 0235 01/04/14 0217 01/05/14 0245  NA 138 138  --  137 136* 134* 137  K 5.0 4.7  --  5.0 4.9 4.8 4.4  CL 103 106  --  101 101 102 100  CO2 19  --   --  18* 16* 15* 19  GLUCOSE 97 96  --  102* 86 87 104*  BUN 53* 47*  --  64* 77* 77* 76*  CREATININE 3.96* 4.50* 4.11* 4.29* 4.61* 4.72* 4.76*  CALCIUM 8.9  --   --  8.6 8.6 8.4 8.6   Liver Function Tests:  Recent Labs Lab 01/01/14 1339 01/02/14 0316  AST 34 34  ALT 12 12  ALKPHOS 42 40  BILITOT 0.4 0.2*  PROT 6.3 5.8*  ALBUMIN 2.6* 2.2*   No results found for this basename: LIPASE, AMYLASE,  in the last 168 hours No results found for this basename: AMMONIA,  in the last 168 hours CBC:  Recent Labs Lab 01/01/14 1339  01/01/14 2023 01/02/14 0316 01/03/14 0235 01/04/14 0217 01/05/14 0245   WBC 3.0*  --  2.4* 4.0 5.9 8.9 7.9  NEUTROABS 2.4  --   --   --   --   --   --   HGB 10.3*  < > 9.7* 9.3* 8.9* 8.3* 8.1*  HCT 32.0*  < > 30.6* 29.5* 27.8* 25.7* 24.9*  MCV 97.0  --  96.5 94.6 94.9 93.1 92.2  PLT 145*  --  158 142* 157 171 188  < > = values in this interval not displayed. Cardiac Enzymes: No results found for this basename: CKTOTAL, CKMB, CKMBINDEX, TROPONINI,  in the last 168 hours BNP (last 3 results)  Recent Labs  07/28/13 1650  PROBNP 32203.0*   CBG:  Recent Labs Lab 01/04/14 0723 01/04/14 1136 01/04/14 1631 01/04/14 2147 01/05/14 0747  GLUCAP 77 115* 123* 106* 119*    Recent Results (from the past 240 hour(s))  URINE CULTURE     Status: None   Collection Time    01/01/14 12:34  PM      Result Value Ref Range Status   Specimen Description URINE, CLEAN CATCH   Final   Special Requests NONE   Final   Culture  Setup Time     Final   Value: 01/01/2014 13:10     Performed at [facility] Count     Final   Value: >=100,000 COLONIES/ML     Performed at [facility]   Culture     Final   Value: STAPHYLOCOCCUS AUREUS     Note: RIFAMPIN AND GENTAMICIN SHOULD NOT BE USED AS SINGLE DRUGS FOR TREATMENT OF STAPH INFECTIONS.     Performed at [facility]   Report Status 01/03/2014 FINAL   Final   Organism ID, Bacteria STAPHYLOCOCCUS AUREUS   Final  CULTURE, BLOOD (ROUTINE X 2)     Status: None   Collection Time    01/01/14  1:50 PM      Result Value Ref Range Status   Specimen Description BLOOD ARM RIGHT   Final   Special Requests     Final   Value: BOTTLES DRAWN AEROBIC AND ANAEROBIC BLUE 10CC RED 5CC   Culture  Setup Time     Final   Value: 01/01/2014 22:00     Performed at [facility]   Culture     Final   Value: GROUP A STREP (S.PYOGENES) ISOLATED     Note: FAXED TO [facility] ATTN CYNTHIA GRANTHAM [id number] BY PEAKY     Note: Gram Stain Report Called to,Read Back By and Verified With: [facility] LAFFARD  01/02/14 @ 12:27PM BY RUSCOE A. CRITICAL RESULT CALLED TO, READ BACK BY AND VERIFIED WITH: STUART WATERS 01/03/14 @ 9:25AM BY RUSCOE A.     Performed at [facility]   Report Status 01/04/2014 FINAL   Final  CULTURE, BLOOD (ROUTINE X 2)     Status: None   Collection Time    01/01/14  2:00 PM      Result Value Ref Range Status   Specimen Description BLOOD HAND RIGHT   Final   Special Requests BOTTLES DRAWN AEROBIC ONLY 10CC   Final   Culture  Setup Time     Final   Value: 01/01/2014 22:01     Performed at [facility]   Culture     Final   Value: GROUP A STREP (S.PYOGENES) ISOLATED     Note: FAXED TO [facility] ATTN CYNTHIA GRANTHAM [id number] BY PEAKY     Note: Gram Stain Report Called to,Read Back By and Verified With: [facility] LAFFARD 01/02/14 @ 12:27PM BY RUSCOE A. CRITICAL RESULT CALLED TO, READ BACK BY AND VERIFIED WITH: STUART WATERS 01/03/14 @ 9:25AM BY RUSCOE A.     Performed at [facility]   Report Status 01/04/2014 FINAL   Final  MRSA PCR SCREENING     Status: None   Collection Time    01/01/14  7:00 PM      Result Value Ref Range Status   MRSA by PCR NEGATIVE  NEGATIVE Final   Comment:            The GeneXpert MRSA Assay (FDA     approved for NASAL specimens     only), is one component of a     comprehensive MRSA colonization     surveillance program. It is not     intended to diagnose MRSA     infection nor to guide or  monitor treatment for     MRSA infections.  CULTURE, BLOOD (ROUTINE X 2)     Status: None   Collection Time    01/01/14  8:16 PM      Result Value Ref Range Status   Specimen Description BLOOD RIGHT HAND   Final   Special Requests BOTTLES DRAWN AEROBIC ONLY 2CC   Final   Culture  Setup Time     Final   Value: 01/02/2014 00:46     Performed at [facility]   Culture     Final   Value:        BLOOD CULTURE RECEIVED NO GROWTH TO DATE CULTURE WILL BE HELD FOR 5 DAYS BEFORE ISSUING A FINAL NEGATIVE REPORT     Performed at  [facility]   Report Status PENDING   Incomplete  CULTURE, BLOOD (ROUTINE X 2)     Status: None   Collection Time    01/01/14  8:23 PM      Result Value Ref Range Status   Specimen Description BLOOD RIGHT ARM   Final   Special Requests     Final   Value: BOTTLES DRAWN AEROBIC AND ANAEROBIC 10CC AER,6CC ANA   Culture  Setup Time     Final   Value: 01/02/2014 00:46     Performed at [facility]   Culture     Final   Value:        BLOOD CULTURE RECEIVED NO GROWTH TO DATE CULTURE WILL BE HELD FOR 5 DAYS BEFORE ISSUING A FINAL NEGATIVE REPORT     Performed at [facility]   Report Status PENDING   Incomplete  CULTURE, BLOOD (ROUTINE X 2)     Status: None   Collection Time    01/02/14  2:25 PM      Result Value Ref Range Status   Specimen Description BLOOD RIGHT HAND   Final   Special Requests BOTTLES DRAWN AEROBIC ONLY 5CC   Final   Culture  Setup Time     Final   Value: 01/02/2014 22:28     Performed at [facility]   Culture     Final   Value:        BLOOD CULTURE RECEIVED NO GROWTH TO DATE CULTURE WILL BE HELD FOR 5 DAYS BEFORE ISSUING A FINAL NEGATIVE REPORT     Performed at [facility]   Report Status PENDING   Incomplete  CULTURE, BLOOD (ROUTINE X 2)     Status: None   Collection Time    01/02/14  2:40 PM      Result Value Ref Range Status   Specimen Description BLOOD RIGHT ARM   Final   Special Requests BOTTLES DRAWN AEROBIC AND ANAEROBIC 10CC EACH   Final   Culture  Setup Time     Final   Value: 01/02/2014 22:28     Performed at [facility]   Culture     Final   Value:        BLOOD CULTURE RECEIVED NO GROWTH TO DATE CULTURE WILL BE HELD FOR 5 DAYS BEFORE ISSUING A FINAL NEGATIVE REPORT     Performed at [facility]   Report Status PENDING   Incomplete     Studies: No results found.  Scheduled Meds: . antiseptic oral rinse  15 mL Mouth Rinse q12n4p  . antiseptic oral rinse  15 mL Mouth Rinse BID  . aspirin  EC  81 mg  Oral Daily  .  ceFAZolin (ANCEF) IV  1 g Intravenous Q24H  . chlorhexidine  15 mL Mouth Rinse BID  . clindamycin (CLEOCIN) IV  600 mg Intravenous 3 times per day  . darbepoetin (ARANESP) injection - NON-DIALYSIS  100 mcg Subcutaneous Q Tue-1800  . docusate sodium  100 mg Oral BID  . heparin  5,000 Units Subcutaneous 3 times per day  . hydrALAZINE  75 mg Oral TID  . insulin aspart  0-15 Units Subcutaneous TID WC  . insulin aspart  0-5 Units Subcutaneous QHS  . polyethylene glycol  17 g Oral Daily  . sodium chloride  3 mL Intravenous Q12H   Continuous Infusions: . sodium chloride      Principal Problem:   Cellulitis Active Problems:   Sepsis   Acute on chronic renal failure   Diabetes mellitus with peripheral vascular disease   Hypertensive heart disease   Tobacco dependency   Chronic diastolic CHF (congestive heart failure)    Time spent: 45 min    Tricia [name], Derico Mitton  [facility] Pager [phone]. If 7PM-7AM, please contact night-coverage at [email or web address], password [email or web address] 01/05/2014, 8:20 AM  LOS: 4 days

## 2014-01-05 NOTE — Progress Notes (Signed)
Subjective: Pt not responsive to questions in arm elevator   Objective: Vital signs in last 24 hours: Temp:  [98 F (36.7 C)-99.2 F (37.3 C)] 98 F (36.7 C) (06/23 0744) Pulse Rate:  [74-76] 74 (06/22 1600) Resp:  [12-26] 13 (06/23 0744) BP: (113-190)/(30-96) 169/61 mmHg (06/23 0744) SpO2:  [91 %-98 %] 95 % (06/23 0744) Weight:  [160 lb 0.9 oz (72.6 kg)] 160 lb 0.9 oz (72.6 kg) (06/23 0500)  Intake/Output from previous day: 06/22 0701 - 06/23 0700 In: 2586.7 [P.O.:410; I.V.:1926.7; IV Piggyback:250] Out: 910 [Urine:910] Intake/Output this shift:     Recent Labs  01/03/14 0235 01/04/14 0217 01/05/14 0245  HGB 8.9* 8.3* 8.1*    Recent Labs  01/04/14 0217 01/05/14 0245  WBC 8.9 7.9  RBC 2.76* 2.70*  HCT 25.7* 24.9*  PLT 171 188    Recent Labs  01/04/14 0217 01/05/14 0245  NA 134* 137  K 4.8 4.4  CL 102 100  CO2 15* 19  BUN 77* 76*  CREATININE 4.72* 4.76*  GLUCOSE 87 104*  CALCIUM 8.4 8.6   No results found for this basename: LABPT, INR,  in the last 72 hours  Neurologically intact Compartment soft erythema decreased today at elbow --did not change dressing todayr  Assessment/Plan: r arm cellulitis improving with epidermolysis of forearm.// plan to watch for now and monitor exam.   GRAVES,JOHN L 01/05/2014, 7:51 AM

## 2014-01-05 NOTE — Progress Notes (Signed)
Subjective:  Events noted- ortho saw patient- thought epidermolysis- placed in sling to keep arm elevated- hemodynamically stable UOP is picking up- creatinine stable  Objective Vital signs in last 24 hours: Filed Vitals:   01/05/14 0600 01/05/14 0630 01/05/14 0744 01/05/14 0800  BP: 159/39 147/39 169/61 166/50  Pulse:      Temp:   98 F (36.7 C)   TempSrc:   Oral   Resp: 13 12 13 27   Height:      Weight:      SpO2: 94% 91% 95% 96%   Weight change: -1.9 kg (-4 lb 3 oz)  Intake/Output Summary (Last 24 hours) at 01/05/14 0848 Last data filed at 01/05/14 0800  Gross per 24 hour  Intake 2486.67 ml  Output   1085 ml  Net 1401.67 ml    Assessment/Plan: 80 year old white female with advanced CKD at baseline. She now has acute on chronic renal failure in the setting of a significant upper extremity cellulitis/group A strep bacteremia and multiple antibiotics as well as a UTI  1.Renal- advanced CKD- stage 4 at baseline. Now with acute on chronic kidney disease in the setting of cellulitis/bacteremia/UTI/antibiotics. I do not see any direct clear-cut insult. Her decreased kidney function is likely based on this whole picture of infection/bacteremia/antibiotics/UTI. She is on an appropriate antibiotics for her urinary tract infection/bacteremia as ID is now involved. There is no obvious need for renal replacement therapy at this time. Hopefully her kidney function is beginning to plateau before improvement takes place.  Son tells me that she was considering not pursuing dialysis at all 2. Hypertension/volume - is not hypotensive. Is on scheduled hydralazine and Norvasc. Does not seem too volume overloaded right now but could be getting there. I am okay with giving her IV fluids right now as she is not taking much PO's. However, will put stop time on IVF given that her tank seems almost full. UOP is good - almost a liter last 24 hours 3. ID- severe cellulitis of her left arm as well as urinary tract  infection and bacteremia. Infectious disease is now involved. She is now on Ancef as well as clindamycin. Surgical input has been obtained as well.  4. Anemia - is likely multifactorial. We'll check iron stores (pending) and also start Aranesp- transfuse PRN    GOLDSBOROUGH,KELLIE A    Labs: Basic Metabolic Panel:  Recent Labs Lab 01/03/14 0235 01/04/14 0217 01/05/14 0245  NA 136* 134* 137  K 4.9 4.8 4.4  CL 101 102 100  CO2 16* 15* 19  GLUCOSE 86 87 104*  BUN 77* 77* 76*  CREATININE 4.61* 4.72* 4.76*  CALCIUM 8.6 8.4 8.6   Liver Function Tests:  Recent Labs Lab 01/01/14 1339 01/02/14 0316  AST 34 34  ALT 12 12  ALKPHOS 42 40  BILITOT 0.4 0.2*  PROT 6.3 5.8*  ALBUMIN 2.6* 2.2*   No results found for this basename: LIPASE, AMYLASE,  in the last 168 hours No results found for this basename: AMMONIA,  in the last 168 hours CBC:  Recent Labs Lab 01/01/14 1339  01/01/14 2023 01/02/14 0316 01/03/14 0235 01/04/14 0217 01/05/14 0245  WBC 3.0*  --  2.4* 4.0 5.9 8.9 7.9  NEUTROABS 2.4  --   --   --   --   --   --   HGB 10.3*  < > 9.7* 9.3* 8.9* 8.3* 8.1*  HCT 32.0*  < > 30.6* 29.5* 27.8* 25.7* 24.9*  MCV 97.0  --  96.5 94.6 94.9 93.1 92.2  PLT 145*  --  158 142* 157 171 188  < > = values in this interval not displayed. Cardiac Enzymes: No results found for this basename: CKTOTAL, CKMB, CKMBINDEX, TROPONINI,  in the last 168 hours CBG:  Recent Labs Lab 01/04/14 0723 01/04/14 1136 01/04/14 1631 01/04/14 2147 01/05/14 0747  GLUCAP 77 115* 123* 106* 119*    Iron Studies: No results found for this basename: IRON, TIBC, TRANSFERRIN, FERRITIN,  in the last 72 hours Studies/Results: No results found. Medications: Infusions: . sodium chloride 100 mL/hr at 01/05/14 [id number]    Scheduled Medications: . amLODipine  10 mg Oral Daily  . antiseptic oral rinse  15 mL Mouth Rinse q12n4p  . antiseptic oral rinse  15 mL Mouth Rinse BID  . aspirin EC  81 mg Oral Daily   .  ceFAZolin (ANCEF) IV  1 g Intravenous Q24H  . chlorhexidine  15 mL Mouth Rinse BID  . clindamycin (CLEOCIN) IV  600 mg Intravenous 3 times per day  . darbepoetin (ARANESP) injection - NON-DIALYSIS  100 mcg Subcutaneous Q Tue-1800  . docusate sodium  100 mg Oral BID  . heparin  5,000 Units Subcutaneous 3 times per day  . hydrALAZINE  75 mg Oral TID  . insulin aspart  0-15 Units Subcutaneous TID WC  . insulin aspart  0-5 Units Subcutaneous QHS  . polyethylene glycol  17 g Oral Daily  . sodium chloride  3 mL Intravenous Q12H    have reviewed scheduled and prn medications.  Physical Exam: General: somnolent- son at bedside Heart: RRR Lungs: mostly clear- poor effort Abdomen: obese, soft, non tender Extremities: mild edema    01/05/2014,8:48 AM  LOS: 4 days

## 2014-01-06 DIAGNOSIS — I25810 Atherosclerosis of coronary artery bypass graft(s) without angina pectoris: Secondary | ICD-10-CM

## 2014-01-06 DIAGNOSIS — E119 Type 2 diabetes mellitus without complications: Secondary | ICD-10-CM

## 2014-01-06 DIAGNOSIS — I129 Hypertensive chronic kidney disease with stage 1 through stage 4 chronic kidney disease, or unspecified chronic kidney disease: Secondary | ICD-10-CM

## 2014-01-06 LAB — RENAL FUNCTION PANEL
ALBUMIN: 2 g/dL — AB (ref 3.5–5.2)
BUN: 73 mg/dL — AB (ref 6–23)
CHLORIDE: 101 meq/L (ref 96–112)
CO2: 16 mEq/L — ABNORMAL LOW (ref 19–32)
Calcium: 8.7 mg/dL (ref 8.4–10.5)
Creatinine, Ser: 4.81 mg/dL — ABNORMAL HIGH (ref 0.50–1.10)
GFR calc Af Amer: 9 mL/min — ABNORMAL LOW (ref >90)
GFR calc non Af Amer: 8 mL/min — ABNORMAL LOW (ref >90)
GLUCOSE: 104 mg/dL — AB (ref 70–99)
Phosphorus: 6.3 mg/dL — ABNORMAL HIGH (ref 2.3–4.6)
Potassium: 4.8 mEq/L (ref 3.7–5.3)
SODIUM: 139 meq/L (ref 137–147)

## 2014-01-06 LAB — GLUCOSE, CAPILLARY
GLUCOSE-CAPILLARY: 94 mg/dL (ref 70–99)
GLUCOSE-CAPILLARY: 116 mg/dL — AB (ref 70–99)
Glucose-Capillary: 105 mg/dL — ABNORMAL HIGH (ref 70–99)
Glucose-Capillary: 102 mg/dL — ABNORMAL HIGH (ref 70–99)

## 2014-01-06 MED ORDER — SODIUM BICARBONATE 650 MG PO TABS
650.0000 mg | ORAL_TABLET | Freq: Two times a day (BID) | ORAL | Status: DC
  Administered 2014-01-06 – 2014-01-19 (×26): 650 mg via ORAL
  Filled 2014-01-06 (×30): qty 1

## 2014-01-06 MED ORDER — CLINDAMYCIN PHOSPHATE 600 MG/50ML IV SOLN
600.0000 mg | Freq: Three times a day (TID) | INTRAVENOUS | Status: DC
  Administered 2014-01-06 – 2014-01-11 (×14): 600 mg via INTRAVENOUS
  Filled 2014-01-06 (×16): qty 50

## 2014-01-06 MED ORDER — HYDRALAZINE HCL 50 MG PO TABS
100.0000 mg | ORAL_TABLET | Freq: Three times a day (TID) | ORAL | Status: DC
  Filled 2014-01-06 (×2): qty 2

## 2014-01-06 MED ORDER — HYDRALAZINE HCL 20 MG/ML IJ SOLN
10.0000 mg | Freq: Once | INTRAMUSCULAR | Status: AC
  Administered 2014-01-06: 10 mg via INTRAVENOUS
  Filled 2014-01-06: qty 1

## 2014-01-06 MED ORDER — FUROSEMIDE 10 MG/ML IJ SOLN
80.0000 mg | Freq: Two times a day (BID) | INTRAMUSCULAR | Status: DC
  Administered 2014-01-06 – 2014-01-07 (×3): 80 mg via INTRAVENOUS
  Administered 2014-01-07: 40 mg via INTRAVENOUS
  Administered 2014-01-08 – 2014-01-14 (×12): 80 mg via INTRAVENOUS
  Filled 2014-01-06 (×21): qty 8

## 2014-01-06 MED ORDER — HYDRALAZINE HCL 50 MG PO TABS
150.0000 mg | ORAL_TABLET | Freq: Three times a day (TID) | ORAL | Status: DC
  Administered 2014-01-06 – 2014-01-10 (×14): 150 mg via ORAL
  Filled 2014-01-06 (×17): qty 3

## 2014-01-06 MED ORDER — HYDRALAZINE HCL 50 MG PO TABS
75.0000 mg | ORAL_TABLET | ORAL | Status: AC
  Administered 2014-01-06: 75 mg via ORAL
  Filled 2014-01-06: qty 1

## 2014-01-06 NOTE — Progress Notes (Signed)
Subjective:  Events noted- blood pressure has been very high- UOP dropped off ? Kidney function is stable Objective Vital signs in last 24 hours: Filed Vitals:   01/06/14 0400 01/06/14 0404 01/06/14 0500 01/06/14 0748  BP: 213/61 213/50 168/33 176/75  Pulse:      Temp:    98.8 F (37.1 C)  TempSrc:    Oral  Resp: 18 21 14 16   Height:      Weight: 72.1 kg (158 lb 15.2 oz)     SpO2: 93% 93% 94% 92%   Weight change: -0.5 kg (-1 lb 1.6 oz)  Intake/Output Summary (Last 24 hours) at 01/06/14 0813 Last data filed at 01/06/14 0800  Gross per 24 hour  Intake 2398.33 ml  Output    200 ml  Net 2198.33 ml    Assessment/Plan: 80 year old white female with advanced CKD at baseline. She now has acute on chronic renal failure in the setting of a significant upper extremity cellulitis/group A strep bacteremia and multiple antibiotics as well as a UTI  1.Renal- advanced CKD- stage 4 at baseline. Now with acute on chronic kidney disease in the setting of cellulitis/bacteremia/UTI/antibiotics. I do not see any direct clear-cut insult. Her decreased kidney function is likely based on this whole picture of infection/bacteremia/antibiotics/UTI. She is on an appropriate antibiotics for her urinary tract infection/bacteremia as ID is now involved. There is no obvious need for renal replacement therapy at this time.  Not sure why UOP dropped off.  I was hoping function was plateauing- now not sure is case.  Son tells me that she was considering not pursuing dialysis at all but no decision made.  I did tell him that dialysis was a possibility this hospitalization if things get worse 2. Hypertension/volume - hypertensive. Is on scheduled hydralazine and Norvasc. Inderal added back. Does not seem too volume overloaded right now but is very positive. IVF stopped, will give dose of lasix as well to see if helps 3. ID- severe cellulitis of her left arm as well as urinary tract infection and bacteremia. Infectious  disease is now involved. She is now on Ancef as well as clindamycin. Surgical input has been obtained as well. Her arm clinically seems better 4. Anemia - is likely multifactorial. Iron stores low but hesitant to give IV iron given bacteremia, have started Aranesp- transfuse PRN 5. MS- poor- infection vs pain meds vs uremia- not really improving 6. Acidosis- add PO bicarb    GOLDSBOROUGH,KELLIE A    Labs: Basic Metabolic Panel:  Recent Labs Lab 01/04/14 0217 01/05/14 0245 01/06/14 0311  NA 134* 137 139  K 4.8 4.4 4.8  CL 102 100 101  CO2 15* 19 16*  GLUCOSE 87 104* 104*  BUN 77* 76* 73*  CREATININE 4.72* 4.76* 4.81*  CALCIUM 8.4 8.6 8.7  PHOS  --   --  6.3*   Liver Function Tests:  Recent Labs Lab 01/01/14 1339 01/02/14 0316 01/06/14 0311  AST 34 34  --   ALT 12 12  --   ALKPHOS 42 40  --   BILITOT 0.4 0.2*  --   PROT 6.3 5.8*  --   ALBUMIN 2.6* 2.2* 2.0*   No results found for this basename: LIPASE, AMYLASE,  in the last 168 hours No results found for this basename: AMMONIA,  in the last 168 hours CBC:  Recent Labs Lab 01/01/14 1339  01/01/14 2023 01/02/14 0316 01/03/14 0235 01/04/14 0217 01/05/14 0245  WBC 3.0*  --  2.4* 4.0  5.9 8.9 7.9  NEUTROABS 2.4  --   --   --   --   --   --   HGB 10.3*  < > 9.7* 9.3* 8.9* 8.3* 8.1*  HCT 32.0*  < > 30.6* 29.5* 27.8* 25.7* 24.9*  MCV 97.0  --  96.5 94.6 94.9 93.1 92.2  PLT 145*  --  158 142* 157 171 188  < > = values in this interval not displayed. Cardiac Enzymes: No results found for this basename: CKTOTAL, CKMB, CKMBINDEX, TROPONINI,  in the last 168 hours CBG:  Recent Labs Lab 01/05/14 0747 01/05/14 1131 01/05/14 1607 01/05/14 2140 01/06/14 0748  GLUCAP 119* 132* 100* 91 94    Iron Studies:   Recent Labs  01/05/14 0245 01/05/14 1347  IRON 22*  --   TIBC 119*  --   FERRITIN  --  155   Studies/Results: No results found. Medications: Infusions: . sodium chloride 10 mL/hr at 01/06/14 0800     Scheduled Medications: . amLODipine  10 mg Oral Daily  . antiseptic oral rinse  15 mL Mouth Rinse q12n4p  . antiseptic oral rinse  15 mL Mouth Rinse BID  . aspirin EC  81 mg Oral Daily  .  ceFAZolin (ANCEF) IV  1 g Intravenous Q24H  . chlorhexidine  15 mL Mouth Rinse BID  . clindamycin (CLEOCIN) IV  600 mg Intravenous 3 times per day  . darbepoetin (ARANESP) injection - NON-DIALYSIS  100 mcg Subcutaneous Q Tue-1800  . docusate sodium  100 mg Oral BID  . feeding supplement (ENSURE COMPLETE)  237 mL Oral BID BM  . heparin  5,000 Units Subcutaneous 3 times per day  . hydrALAZINE  75 mg Oral TID  . insulin aspart  0-15 Units Subcutaneous TID WC  . insulin aspart  0-5 Units Subcutaneous QHS  . polyethylene glycol  17 g Oral Daily  . sodium chloride  3 mL Intravenous Q12H    have reviewed scheduled and prn medications.  Physical Exam: General: somnolent- son at bedside Heart: RRR Lungs: mostly clear- poor effort Abdomen: obese, soft, non tender Extremities: mild edema- left arm bandaged in elevator- actually seems less red to me    01/06/2014,8:13 AM  LOS: 5 days

## 2014-01-06 NOTE — Consult Note (Addendum)
WOC wound consult note Reason for Consult: Wound consult requested for left arm.  [facility] refer to previous progress notes for assessment, measurements, and topical treatment.  Ortho team  (Dr Luiz BlareGraves) is now following for further assessment and plan of care if wound has declined.  Refer to his previous progress notes and please discuss further questions with the ortho team. Please re-consult if further assistance is needed.  Thank-you,  Cammie Mcgeeawn Engels MSN, RN, CWOCN, [place], CNS [phone]

## 2014-01-06 NOTE — Consult Note (Addendum)
[facility] for Infectious Disease  Total days of antibiotics 6        Day 3 clindamycin        Day 3 cefazolin               Reason for Consult: left arm cellulitis    Referring Physician: samtani  Principal Problem:   Cellulitis Active Problems:   Hypertensive heart disease   Tobacco dependency   Chronic diastolic CHF (congestive heart failure)   Sepsis   Acute on chronic renal failure   Diabetes mellitus with peripheral vascular disease    HPI: Tricia [name] is a 80 y.o. female with CAD s/p cabg,  HTN, DM, peripheral vascular disease, status post right above-the-knee amputation, stage IV chronic kidney disease, admitted on 6/19 with generalized weakness, feeling poorly and left upper extremity swelling and pain x 24hr. Arm pain progressively worsened now with exudative drainage.  She denies recent trauma, falls, or injuries to the affected extremity. She complains of associated weakness, functional decline, fevers, chills, malaise, anorexia, overall doing poorly in the last 12 hours.On admit, she was febrile to 101F, labs revealed leukopenia with wbc 3.0 with greater than 20% bands. Cr 3.96 up from baseline of 3.22. LA 2.2. Due to concern for deep tissue infection/nec fascitiis, she underwent left forearm CT which showed severe diffuse edema with severe cellulitis. General surgery and orthopedics consulted, who thought she had left upper extremity cellulitis and likely epidermal lysis from toxins likely from staph or strep. She was empirically started on vancomycin and piptazo, but due to worsening in the first 24-48hr, she was changed to cefazolin,plus clinda for toxin inhibition against strep or staph infection. Her blood cx from admit grew out group A strep bacteremia but not isolated on further blood cx. Interestingly, her urine cx grew MSSA. Hospital course is that she is afebrile, but sustaining some kidney injury from disseminated infection. Arm is having desquamation of  skin/bullae from group A strep infection.    Past Medical History  Diagnosis Date  . Hypertension   . Hyperlipidemia   . Coronary artery disease     post CABG in 1999, with 2-vessel disease -- - LIMA graft to the LAD and a left radial artery graft to the PDA   . Old inferior wall myocardial infarction 1999  . 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 Tobacco dependency   . CKD (chronic kidney disease) stage 4, GFR 15-29 ml/min     followed by Dr. Marval Regal  . Diastolic heart failure   . Aortic stenosis     Mild  . Type 2 diabetes mellitus with vascular disease   . CKD (chronic kidney disease), stage IV   . Hypertensive heart disease   . Anxiety   . Depression   . Cataract   . Ovarian cyst     Allergies: No Known Allergies   MEDICATIONS: . amLODipine  10 mg Oral Daily  . antiseptic oral rinse  15 mL Mouth Rinse q12n4p  . antiseptic oral rinse  15 mL Mouth Rinse BID  . aspirin EC  81 mg Oral Daily  .  ceFAZolin (ANCEF) IV  1 g Intravenous Q24H  . chlorhexidine  15 mL Mouth Rinse BID  . clindamycin (CLEOCIN) IV  600 mg Intravenous 3 times per day  . darbepoetin (ARANESP) injection - NON-DIALYSIS  100 mcg Subcutaneous Q Tue-1800  . docusate sodium  100 mg Oral BID  . feeding  supplement (ENSURE COMPLETE)  237 mL Oral BID BM  . furosemide  80 mg Intravenous Q12H  . heparin  5,000 Units Subcutaneous 3 times per day  . hydrALAZINE  150 mg Oral TID  . insulin aspart  0-15 Units Subcutaneous TID WC  . insulin aspart  0-5 Units Subcutaneous QHS  . polyethylene glycol  17 g Oral Daily  . sodium bicarbonate  650 mg Oral BID  . sodium chloride  3 mL Intravenous Q12H    History  Substance Use Topics  . Smoking status: Current Every Day Smoker -- 0.50 packs/day for 60 years    Types: Cigarettes  . Smokeless tobacco: Never Used  . Alcohol Use: No    Family History  Problem Relation Age of Onset  . Heart attack Father 48     Review of Systems  12 point ros  reviewed. + pain to left arm   OBJECTIVE: Temp:  [97.3 F (36.3 C)-98.8 F (37.1 C)] 98 F (36.7 C) (06/24 1554) Pulse Rate:  [80] 80 (06/24 0004) Resp:  [12-24] 16 (06/24 1600) BP: (168-213)/(31-106) 210/50 mmHg (06/24 1600) SpO2:  [92 %-98 %] 98 % (06/24 1600) Weight:  [158 lb 15.2 oz (72.1 kg)] 158 lb 15.2 oz (72.1 kg) (06/24 0400)  Constitutional:  oriented to person, place. Chronically ill appearing. Older than stated age. Mild  distress.  HENT:  Mouth/Throat: Oropharynx is clear and moist. No oropharyngeal exudate. Dry oral pharynx no ulcerative lesions Cardiovascular: Normal rate, regular rhythm and normal heart sounds. Exam reveals no gallop and no friction rub.  No murmur heard.  Pulmonary/Chest: Effort normal and breath sounds normal. No respiratory distress.  has no wheezes.  Abdominal: Soft. Bowel sounds are normal.  exhibits no distension. There is no tenderness.  Lymphadenopathy: no cervical adenopathy.   Skin: left arm has erythema circumferential to forearm tracking above elbow and into axilla (marked this evening). Desquamation of skin with scattered patches of purulent exudate in retained areas with epidermis. No crepitus. Fore arm appears more affected    LABS: Results for orders placed during the hospital encounter of 01/01/14 (from the past 48 hour(s))  GLUCOSE, CAPILLARY     Status: Abnormal   Collection Time    01/04/14  9:47 PM      Result Value Ref Range   Glucose-Capillary 106 (*) 70 - 99 mg/dL  BASIC METABOLIC PANEL     Status: Abnormal   Collection Time    01/05/14  2:45 AM      Result Value Ref Range   Sodium 137  137 - 147 mEq/L   Potassium 4.4  3.7 - 5.3 mEq/L   Chloride 100  96 - 112 mEq/L   CO2 19  19 - 32 mEq/L   Glucose, Bld 104 (*) 70 - 99 mg/dL   BUN 76 (*) 6 - 23 mg/dL   Creatinine, Ser 4.76 (*) 0.50 - 1.10 mg/dL   Calcium 8.6  8.4 - 10.5 mg/dL   GFR calc non Af Amer 8 (*) >90 mL/min   GFR calc Af Amer 9 (*) >90 mL/min   Comment:  (NOTE)     The eGFR has been calculated using the CKD EPI equation.     This calculation has not been validated in all clinical situations.     eGFR's persistently <90 mL/min signify possible Chronic Kidney     Disease.  CBC     Status: Abnormal   Collection Time    01/05/14  2:45 AM  Result Value Ref Range   WBC 7.9  4.0 - 10.5 K/uL   RBC 2.70 (*) 3.87 - 5.11 MIL/uL   Hemoglobin 8.1 (*) 12.0 - 15.0 g/dL   HCT 24.9 (*) 36.0 - 46.0 %   MCV 92.2  78.0 - 100.0 fL   MCH 30.0  26.0 - 34.0 pg   MCHC 32.5  30.0 - 36.0 g/dL   RDW 14.2  11.5 - 15.5 %   Platelets 188  150 - 400 K/uL  IRON AND TIBC     Status: Abnormal   Collection Time    01/05/14  2:45 AM      Result Value Ref Range   Iron 22 (*) 42 - 135 ug/dL   TIBC 119 (*) 250 - 470 ug/dL   Saturation Ratios 18 (*) 20 - 55 %   UIBC 97 (*) 125 - 400 ug/dL   Comment: Performed at [place], CAPILLARY     Status: Abnormal   Collection Time    01/05/14  7:47 AM      Result Value Ref Range   Glucose-Capillary 119 (*) 70 - 99 mg/dL  GLUCOSE, CAPILLARY     Status: Abnormal   Collection Time    01/05/14 11:31 AM      Result Value Ref Range   Glucose-Capillary 132 (*) 70 - 99 mg/dL  FERRITIN     Status: None   Collection Time    01/05/14  1:47 PM      Result Value Ref Range   Ferritin 155  10 - 291 ng/mL   Comment: Performed at [place], CAPILLARY     Status: Abnormal   Collection Time    01/05/14  4:07 PM      Result Value Ref Range   Glucose-Capillary 100 (*) 70 - 99 mg/dL  GLUCOSE, CAPILLARY     Status: None   Collection Time    01/05/14  9:40 PM      Result Value Ref Range   Glucose-Capillary 91  70 - 99 mg/dL  RENAL FUNCTION PANEL     Status: Abnormal   Collection Time    01/06/14  3:11 AM      Result Value Ref Range   Sodium 139  137 - 147 mEq/L   Potassium 4.8  3.7 - 5.3 mEq/L   Chloride 101  96 - 112 mEq/L   CO2 16 (*) 19 - 32 mEq/L   Glucose, Bld 104 (*) 70 - 99 mg/dL    BUN 73 (*) 6 - 23 mg/dL   Creatinine, Ser 4.81 (*) 0.50 - 1.10 mg/dL   Calcium 8.7  8.4 - 10.5 mg/dL   Phosphorus 6.3 (*) 2.3 - 4.6 mg/dL   Albumin 2.0 (*) 3.5 - 5.2 g/dL   GFR calc non Af Amer 8 (*) >90 mL/min   GFR calc Af Amer 9 (*) >90 mL/min   Comment: (NOTE)     The eGFR has been calculated using the CKD EPI equation.     This calculation has not been validated in all clinical situations.     eGFR's persistently <90 mL/min signify possible Chronic Kidney     Disease.  GLUCOSE, CAPILLARY     Status: None   Collection Time    01/06/14  7:48 AM      Result Value Ref Range   Glucose-Capillary 94  70 - 99 mg/dL  GLUCOSE, CAPILLARY     Status: Abnormal   Collection Time  01/06/14 11:44 AM      Result Value Ref Range   Glucose-Capillary 105 (*) 70 - 99 mg/dL  GLUCOSE, CAPILLARY     Status: Abnormal   Collection Time    01/06/14  3:53 PM      Result Value Ref Range   Glucose-Capillary 116 (*) 70 - 99 mg/dL  6/19 + nitrites + LE, WBC 21-50 but many squamous cells  MICRO: 6/19 blood cx group a strep 6/19 blood cx evening: NGTD 6/20 blood cx :NGTD  IMAGING: forearm ct on 6/19 There are numerous vascular clips along the volar surface of the  forearm beginning just distal to the elbow joint and extending along  the volar/radial surface all the way to the level of the wrist.  There is diffuse thickening of the dermis circumferentially. There  is reticular edematous change in the subcutaneous soft tissues  circumferentially, of moderate severity diffusely, but particularly  severe along the dorsal surface. Edematous changes extend into the  hand. There is no evidence of focal organized fluid collection to  suggest abscess. There is no focal osseous abnormalities to suggest  osteomyelitis.  IMPRESSION:  Severe diffuse edematous change consistent with severe cellulitis.   Assessment/Plan:  80 yo F with DM, CAD, CKD admitted for group a strep sepsis with left arm  epidermolysis/severe cellulitis and associated bacteremia. Currently on cefazolin plus clindamycin. Cellulitis possibly worsening by comparison of what her arm looks like this evening vs. Pictures in hospitalist note from this morning. Appears that it encompasses 8% BSA. Patient is not febrile or hemodynamically unstable  -  Continue on cefazolin and clindamycin for now, currently dosed for her Crcl 10 - will check cbc with diff to see if still having left shift/worsening bandemia - if worsens this evening or tomorrow am, will switch back to piptazo with the thought may have 2nd bacterial infection of arm. - recommend to get wound/ plastics consultation  Back involved to see if they have other recommendations other than vaseline gauze. - briefly spoke to Dr. Hulen Skains, if patient clinically worsens to have surgery consultants reassess need for surgery. Continue with med management  questionable MSSA UTI = collection sample had many squamous cells, which is thought to be not appropriate specimen. Nonetheless, it would be treated with cefazolin.  [facility] [phone]

## 2014-01-06 NOTE — Progress Notes (Signed)
NP notified of increased BP. New orders received and carried out. Will continue to monitor.

## 2014-01-06 NOTE — Progress Notes (Signed)
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  She presented with complaints of left upper extremity pain, swelling, erythema, having an associated functional decline, generalized weakness, feeling ill. Patient found to have severe left upper extremity cellulitis, with initial lab work showing white count of 3000 with presence of bands, temperature 101.1, development of acute on chronic renal failure. Given severity of presentation a CT scan of her left upper extremity was obtained in the emergency room which revealed severe diffuse edematous changes consistent with severe cellulitis. She was initially started on broad-spectrum IV antibiotic therapy with vancomycin and Zosyn. General surgery was consulted.  She showed little improvement over the following days, having further deterioration to kidney function as well as becoming more encephalopathic. Blood cultures obtained on 01/01/2014, back positive for group A streptococcus in both sets. Repeat blood cultures were obtained. General surgery recommended orthopedic surgery consultation.  On 01/04/2014 patient was seen and evaluated by Dr. Luiz Blare of orthopedic surgery. He recommended ongoing IV antimicrobial therapy and did not feel surgical intervention was warranted at this time. Dr. Drue Second of infectious disease was consulted and recommended the discontinuation of vancomycin and Zosyn and starting Clindamycin and Ancef. With patient's deterioration to kidney function Dr. Kathrene Bongo of nephrology was consulted as well. Though initially we had planned on re-imaging her arm, speaking with Dr. Luiz Blare, he did  not recommend pursuing MRI. Plan to continue IV antibiotic therapy, close monitoring of left upper extremity, supportive care.                                                                             Assessment/Plan: 1. Sepsis -Present on admission, evidence by leukopenia  3000, presence of bands, temperature of 101.1, respiratory rate of 27, having positive blood cultures -Source of infection likely to be left upper extremity cellulitis.  -Left upper extremity appearing better -Continue supportive care, IV fluids, IV antimicrobial therapy -Microbiology reporting that blood cultures positive for Group A Strep x 2 sets  Repeat blood cultures drawn on 01/02/2014 showing no growth.  -ID consulted recommended  to Clindamycin and Ancef on 6/22 -WOC nurse asked to assess area  2.  Left upper extremity cellulitis -CT scan of left upper extremity showing severe diffuse changes consistent with cellulitis.  -Will continue IV AB coverage, now on Clindamycin and Ancef -Wound care, General Surgery and Orthopedics consulted -Blood cultures growing Group A Strep  3.  Acute on Chronic Renal Failure -Patient with history of stage IV CKD, baseline creatinine near 3.4.  -AM lab work showing creatinine of 4/76 -Placed on bicard gtt, now BMP showing bicarb of 19. Will stop bicarb and switch to NS -Dr Kathrene Bongo of Nephrology input appreciated   4. Chronic diastolic CHF -Compensated.  -Diuretics stopped on admission.  -Follow volume status closely as she is getting IV fluids  5.  Metabolic Acidosis -Bicab at 16 on 01/06/2014, likely related to renal failure.   6. UTI -On  broad spectrum AB therapy, will follow up on urine cultures  7. Type 2 diabetes Mellitus -Continue accuchecks ac and hs  Code Status: Full Family Communication: called and  Disposition Plan: Continue IV fluids, IV AB's   Consultants:  Surgery   Antibiotics:  Vanc  Zosyn  HPI/Subjective:  Alert. Some  confusion Knows hospital, city, cannot tell me the year  Objective: Filed Vitals:   01/06/14 0748  BP: 176/75  Pulse:   Temp: 98.8 F (37.1 C)  Resp: 16    Intake/Output Summary (Last 24 hours) at 01/06/14 0914 Last data filed at 01/06/14 0800  Gross per 24 hour  Intake 2398.33 ml  Output    200 ml  Net 2198.33 ml   Filed Weights   01/04/14 0300 01/05/14 0500 01/06/14 0400  Weight: 74.5 kg (164 lb 3.9 oz) 72.6 kg (160 lb 0.9 oz) 72.1 kg (158 lb 15.2 oz)    Exam:   General:  Patient is lethargic, confused, can not follow commands  Cardiovascular: Regular rate and rhythm, normal S1S2  Respiratory: Clear to auscultation, normal inspiratory effort  Abdomen: Soft nontender nondistended  Musculoskeletal: There is some decrease in swelling of left upper extremity, there is ongoing weeping         Data Reviewed: Basic Metabolic Panel:  Recent Labs Lab 01/02/14 0316 01/03/14 0235 01/04/14 0217 01/05/14 0245 01/06/14 0311  NA 137 136* 134* 137 139  K 5.0 4.9 4.8 4.4 4.8  CL 101 101 102 100 101  CO2 18* 16* 15* 19 16*  GLUCOSE 102* 86 87 104* 104*  BUN 64* 77* 77* 76* 73*  CREATININE 4.29* 4.61* 4.72* 4.76* 4.81*  CALCIUM 8.6 8.6 8.4 8.6 8.7  PHOS  --   --   --   --  6.3*   Liver Function Tests:  Recent Labs Lab 01/01/14 1339 01/02/14 0316 01/06/14 0311  AST 34 34  --   ALT 12 12  --   ALKPHOS 42 40  --   BILITOT 0.4 0.2*  --   PROT 6.3 5.8*  --   ALBUMIN 2.6* 2.2* 2.0*   No results found for this basename: LIPASE, AMYLASE,  in the last 168 hours No results found for this basename: AMMONIA,  in the last 168 hours CBC:  Recent Labs Lab 01/01/14 1339  01/01/14 2023 01/02/14 0316 01/03/14 0235 01/04/14 0217 01/05/14 0245  WBC 3.0*  --  2.4* 4.0 5.9 8.9 7.9  NEUTROABS 2.4  --   --   --   --   --   --   HGB 10.3*  < > 9.7* 9.3* 8.9* 8.3* 8.1*  HCT 32.0*  < > 30.6* 29.5* 27.8* 25.7* 24.9*  MCV 97.0  --  96.5 94.6 94.9 93.1 92.2  PLT 145*   --  158 142* 157 171 188  < > = values in this interval not displayed. Cardiac Enzymes: No results found for this basename: CKTOTAL, CKMB, CKMBINDEX, TROPONINI,  in the last 168 hours BNP (last 3 results)  Recent Labs  07/28/13 1650  PROBNP 32203.0*   CBG:  Recent Labs Lab 01/05/14 0747 01/05/14 1131 01/05/14 1607 01/05/14 2140 01/06/14 0748  GLUCAP 119* 132* 100* 91 94    Recent Results (from the past 240 hour(s))  URINE CULTURE     Status: None   Collection Time    01/01/14 12:34 PM      Result Value Ref Range Status   Specimen Description URINE, CLEAN CATCH   Final  Special Requests NONE   Final   Culture  Setup Time     Final   Value: 01/01/2014 13:10     Performed at [facility]   Colony Count     Final   Value: >=100,000 COLONIES/ML     Performed at [facility]   Culture     Final   Value: STAPHYLOCOCCUS AUREUS     Note: RIFAMPIN AND GENTAMICIN SHOULD NOT BE USED AS SINGLE DRUGS FOR TREATMENT OF STAPH INFECTIONS.     Performed at [facility]   Report Status 01/03/2014 FINAL   Final   Organism ID, Bacteria STAPHYLOCOCCUS AUREUS   Final  CULTURE, BLOOD (ROUTINE X 2)     Status: None   Collection Time    01/01/14  1:50 PM      Result Value Ref Range Status   Specimen Description BLOOD ARM RIGHT   Final   Special Requests     Final   Value: BOTTLES DRAWN AEROBIC AND ANAEROBIC BLUE 10CC RED 5CC   Culture  Setup Time     Final   Value: 01/01/2014 22:00     Performed at [facility]   Culture     Final   Value: GROUP A STREP (S.PYOGENES) ISOLATED     Note: FAXED TO [facility] ATTN CYNTHIA GRANTHAM [phone] BY PEAKY     Note: Gram Stain Report Called to,Read Back By and Verified With: [facility] 01/02/14 @ 12:27PM BY RUSCOE A. CRITICAL RESULT CALLED TO, READ BACK BY AND VERIFIED WITH: STUART WATERS 01/03/14 @ 9:25AM BY RUSCOE A.     Performed at [facility]   Report Status 01/04/2014 FINAL   Final  CULTURE, BLOOD  (ROUTINE X 2)     Status: None   Collection Time    01/01/14  2:00 PM      Result Value Ref Range Status   Specimen Description BLOOD HAND RIGHT   Final   Special Requests BOTTLES DRAWN AEROBIC ONLY 10CC   Final   Culture  Setup Time     Final   Value: 01/01/2014 22:01     Performed at [facility]   Culture     Final   Value: GROUP A STREP (S.PYOGENES) ISOLATED     Note: FAXED TO [facility] ATTN CYNTHIA GRANTHAM [phone] BY PEAKY     Note: Gram Stain Report Called to,Read Back By and Verified With: [facility] 01/02/14 @ 12:27PM BY RUSCOE A. CRITICAL RESULT CALLED TO, READ BACK BY AND VERIFIED WITH: STUART WATERS 01/03/14 @ 9:25AM BY RUSCOE A.     Performed at [facility]   Report Status 01/04/2014 FINAL   Final  MRSA PCR SCREENING     Status: None   Collection Time    01/01/14  7:00 PM      Result Value Ref Range Status   MRSA by PCR NEGATIVE  NEGATIVE Final   Comment:            The GeneXpert MRSA Assay (FDA     approved for NASAL specimens     only), is one component of a     comprehensive MRSA colonization     surveillance program. It is not     intended to diagnose MRSA     infection nor to guide or     monitor treatment for     MRSA infections.  CULTURE, BLOOD (ROUTINE X 2)  Status: None   Collection Time    01/01/14  8:16 PM      Result Value Ref Range Status   Specimen Description BLOOD RIGHT HAND   Final   Special Requests BOTTLES DRAWN AEROBIC ONLY 2CC   Final   Culture  Setup Time     Final   Value: 01/02/2014 00:46     Performed at [facility]   Culture     Final   Value:        BLOOD CULTURE RECEIVED NO GROWTH TO DATE CULTURE WILL BE HELD FOR 5 DAYS BEFORE ISSUING A FINAL NEGATIVE REPORT     Performed at [facility]   Report Status PENDING   Incomplete  CULTURE, BLOOD (ROUTINE X 2)     Status: None   Collection Time    01/01/14  8:23 PM      Result Value Ref Range Status   Specimen Description BLOOD RIGHT ARM   Final    Special Requests     Final   Value: BOTTLES DRAWN AEROBIC AND ANAEROBIC 10CC AER,6CC ANA   Culture  Setup Time     Final   Value: 01/02/2014 00:46     Performed at [facility]   Culture     Final   Value:        BLOOD CULTURE RECEIVED NO GROWTH TO DATE CULTURE WILL BE HELD FOR 5 DAYS BEFORE ISSUING A FINAL NEGATIVE REPORT     Performed at [facility]   Report Status PENDING   Incomplete  CULTURE, BLOOD (ROUTINE X 2)     Status: None   Collection Time    01/02/14  2:25 PM      Result Value Ref Range Status   Specimen Description BLOOD RIGHT HAND   Final   Special Requests BOTTLES DRAWN AEROBIC ONLY 5CC   Final   Culture  Setup Time     Final   Value: 01/02/2014 22:28     Performed at [facility]   Culture     Final   Value:        BLOOD CULTURE RECEIVED NO GROWTH TO DATE CULTURE WILL BE HELD FOR 5 DAYS BEFORE ISSUING A FINAL NEGATIVE REPORT     Performed at [facility]   Report Status PENDING   Incomplete  CULTURE, BLOOD (ROUTINE X 2)     Status: None   Collection Time    01/02/14  2:40 PM      Result Value Ref Range Status   Specimen Description BLOOD RIGHT ARM   Final   Special Requests BOTTLES DRAWN AEROBIC AND ANAEROBIC 10CC EACH   Final   Culture  Setup Time     Final   Value: 01/02/2014 22:28     Performed at [facility]   Culture     Final   Value:        BLOOD CULTURE RECEIVED NO GROWTH TO DATE CULTURE WILL BE HELD FOR 5 DAYS BEFORE ISSUING A FINAL NEGATIVE REPORT     Performed at [facility]   Report Status PENDING   Incomplete     Studies: No results found.  Scheduled Meds: . amLODipine  10 mg Oral Daily  . antiseptic oral rinse  15 mL Mouth Rinse q12n4p  . antiseptic oral rinse  15 mL Mouth Rinse BID  . aspirin EC  81 mg Oral Daily  .  ceFAZolin (ANCEF) IV  1 g Intravenous  Q24H  . chlorhexidine  15 mL Mouth Rinse BID  . clindamycin (CLEOCIN) IV  600 mg Intravenous 3 times per day  . darbepoetin  (ARANESP) injection - NON-DIALYSIS  100 mcg Subcutaneous Q Tue-1800  . docusate sodium  100 mg Oral BID  . feeding supplement (ENSURE COMPLETE)  237 mL Oral BID BM  . furosemide  80 mg Intravenous Q12H  . heparin  5,000 Units Subcutaneous 3 times per day  . hydrALAZINE  75 mg Oral TID  . insulin aspart  0-15 Units Subcutaneous TID WC  . insulin aspart  0-5 Units Subcutaneous QHS  . polyethylene glycol  17 g Oral Daily  . sodium bicarbonate  650 mg Oral BID  . sodium chloride  3 mL Intravenous Q12H   Continuous Infusions: . sodium chloride 10 mL/hr at 01/06/14 0800    Principal Problem:   Cellulitis Active Problems:   Hypertensive heart disease   Tobacco dependency   Chronic diastolic CHF (congestive heart failure)   Sepsis   Acute on chronic renal failure   Diabetes mellitus with peripheral vascular disease    Time spent: 45 min  Pleas Koch, MD [facility] [phone]

## 2014-01-06 NOTE — Progress Notes (Signed)
PRN Inderal given for elevated BP. Will continue to monitor and notify MD if this does not bring it down.

## 2014-01-07 ENCOUNTER — Encounter (HOSPITAL_COMMUNITY): Payer: Self-pay | Admitting: Plastic Surgery

## 2014-01-07 LAB — GLUCOSE, CAPILLARY
GLUCOSE-CAPILLARY: 92 mg/dL (ref 70–99)
GLUCOSE-CAPILLARY: 109 mg/dL — AB (ref 70–99)
Glucose-Capillary: 102 mg/dL — ABNORMAL HIGH (ref 70–99)
Glucose-Capillary: 82 mg/dL (ref 70–99)

## 2014-01-07 LAB — RENAL FUNCTION PANEL
ALBUMIN: 2.1 g/dL — AB (ref 3.5–5.2)
BUN: 77 mg/dL — AB (ref 6–23)
CALCIUM: 8.7 mg/dL (ref 8.4–10.5)
CHLORIDE: 101 meq/L (ref 96–112)
CO2: 16 mEq/L — ABNORMAL LOW (ref 19–32)
CREATININE: 4.82 mg/dL — AB (ref 0.50–1.10)
GFR calc Af Amer: 9 mL/min — ABNORMAL LOW (ref >90)
GFR, EST NON AFRICAN AMERICAN: 8 mL/min — AB (ref >90)
Glucose, Bld: 92 mg/dL (ref 70–99)
PHOSPHORUS: 6.9 mg/dL — AB (ref 2.3–4.6)
Potassium: 4.4 mEq/L (ref 3.7–5.3)
Sodium: 141 mEq/L (ref 137–147)

## 2014-01-07 LAB — CBC WITH DIFFERENTIAL/PLATELET
BASOS ABS: 0.1 10*3/uL (ref 0.0–0.1)
Basophils Relative: 1 % (ref 0–1)
EOS ABS: 0.1 10*3/uL (ref 0.0–0.7)
Eosinophils Relative: 1 % (ref 0–5)
HCT: 28.2 % — ABNORMAL LOW (ref 36.0–46.0)
Hemoglobin: 9.1 g/dL — ABNORMAL LOW (ref 12.0–15.0)
Lymphocytes Relative: 13 % (ref 12–46)
Lymphs Abs: 1.2 10*3/uL (ref 0.7–4.0)
MCH: 29.4 pg (ref 26.0–34.0)
MCHC: 32.3 g/dL (ref 30.0–36.0)
MCV: 91.3 fL (ref 78.0–100.0)
Monocytes Absolute: 1.1 10*3/uL — ABNORMAL HIGH (ref 0.1–1.0)
Monocytes Relative: 12 % (ref 3–12)
NEUTROS ABS: 6.4 10*3/uL (ref 1.7–7.7)
Neutrophils Relative %: 73 % (ref 43–77)
Platelets: 277 10*3/uL (ref 150–400)
RBC: 3.09 MIL/uL — ABNORMAL LOW (ref 3.87–5.11)
RDW: 14 % (ref 11.5–15.5)
WBC: 8.9 10*3/uL (ref 4.0–10.5)

## 2014-01-07 MED ORDER — SILVER SULFADIAZINE 1 % EX CREA
TOPICAL_CREAM | Freq: Every day | CUTANEOUS | Status: DC
  Administered 2014-01-07: 11:00:00 via TOPICAL
  Administered 2014-01-08: 1 via TOPICAL
  Filled 2014-01-07: qty 85

## 2014-01-07 MED ORDER — SORBITOL 70 % SOLN
30.0000 mL | Freq: Every day | Status: DC | PRN
  Filled 2014-01-07: qty 30

## 2014-01-07 MED ORDER — CLONIDINE HCL 0.1 MG PO TABS
0.1000 mg | ORAL_TABLET | Freq: Four times a day (QID) | ORAL | Status: DC | PRN
  Administered 2014-01-07 – 2014-01-13 (×7): 0.1 mg via ORAL
  Filled 2014-01-07 (×9): qty 1

## 2014-01-07 MED ORDER — POLYETHYLENE GLYCOL 3350 17 G PO PACK
17.0000 g | PACK | Freq: Every day | ORAL | Status: DC | PRN
  Filled 2014-01-07: qty 1

## 2014-01-07 NOTE — Consult Note (Signed)
Reason for Consult: Arm cellulitis Referring Physician: Family Medicine Service  Tricia [name] is an 80 y.o. female.  HPI: The patient is a 80 yrs old wf here for treatment of her left arm.  She states that ~ 1 week ago she might have bumped her arm.  She developed swelling, redness and pain of the arm.  On exam she presents like a burn injury with swelling, redness, discoloration, desquamation, and white full thickness patches. It involves the majority of her dorsal hand, arm and forearm.  The tissue is soft and not more painful to touch.  The pulse is present and equal bilaterally.  No crepitus is noted or compartment syndrome.  Past Medical History  Diagnosis Date  . Hypertension   . Hyperlipidemia   . Coronary artery disease     post CABG in 1999, with 2-vessel disease -- - LIMA graft to the LAD and a left radial artery graft to the PDA   . Old inferior wall myocardial infarction 1999  . 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 Tobacco dependency   . CKD (chronic kidney disease) stage 4, GFR 15-29 ml/min     followed by Dr. Marval Regal  . Diastolic heart failure   . Aortic stenosis     Mild  . Type 2 diabetes mellitus with vascular disease   . CKD (chronic kidney disease), stage IV   . Hypertensive heart disease   . Anxiety   . Depression   . Cataract   . Ovarian cyst     Past Surgical History  Procedure Laterality Date  . Partial hysterectomy    . Total vaginal hysterectomy    . 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 Iliac vein angioplasty / stenting  08/10/2010     Left common iliac stenting  . Coronary artery bypass graft  01/26/1998    Est. EF of 40-45% -- with 2-vessel disease -- LIMA graft to the LAD and a left radial artery graft to the PDA   . Cardiac catheterization      Family History  Problem Relation Age of Onset  . Heart attack Father 83    Social History:  reports that she has been smoking Cigarettes.  She has a 30 pack-year smoking  history. She has never used smokeless tobacco. She reports that she does not drink alcohol or use illicit drugs.  Allergies: No Known Allergies  Medications: I have reviewed the patient's current medications.  Results for orders placed during the hospital encounter of 01/01/14 (from the past 48 hour(s))  GLUCOSE, CAPILLARY     Status: Abnormal   Collection Time    01/05/14 11:31 AM      Result Value Ref Range   Glucose-Capillary 132 (*) 70 - 99 mg/dL  FERRITIN     Status: None   Collection Time    01/05/14  1:47 PM      Result Value Ref Range   Ferritin 155  10 - 291 ng/mL   Comment: Performed at [place], CAPILLARY     Status: Abnormal   Collection Time    01/05/14  4:07 PM      Result Value Ref Range   Glucose-Capillary 100 (*) 70 - 99 mg/dL  GLUCOSE, CAPILLARY     Status: None   Collection Time    01/05/14  9:40 PM      Result Value Ref Range   Glucose-Capillary 91  70 - 99 mg/dL  RENAL FUNCTION PANEL     Status: Abnormal   Collection Time    01/06/14  3:11 AM      Result Value Ref Range   Sodium 139  137 - 147 mEq/L   Potassium 4.8  3.7 - 5.3 mEq/L   Chloride 101  96 - 112 mEq/L   CO2 16 (*) 19 - 32 mEq/L   Glucose, Bld 104 (*) 70 - 99 mg/dL   BUN 73 (*) 6 - 23 mg/dL   Creatinine, Ser 4.81 (*) 0.50 - 1.10 mg/dL   Calcium 8.7  8.4 - 10.5 mg/dL   Phosphorus 6.3 (*) 2.3 - 4.6 mg/dL   Albumin 2.0 (*) 3.5 - 5.2 g/dL   GFR calc non Af Amer 8 (*) >90 mL/min   GFR calc Af Amer 9 (*) >90 mL/min   Comment: (NOTE)     The eGFR has been calculated using the CKD EPI equation.     This calculation has not been validated in all clinical situations.     eGFR's persistently <90 mL/min signify possible Chronic Kidney     Disease.  GLUCOSE, CAPILLARY     Status: None   Collection Time    01/06/14  7:48 AM      Result Value Ref Range   Glucose-Capillary 94  70 - 99 mg/dL  GLUCOSE, CAPILLARY     Status: Abnormal   Collection Time    01/06/14 11:44 AM       Result Value Ref Range   Glucose-Capillary 105 (*) 70 - 99 mg/dL  GLUCOSE, CAPILLARY     Status: Abnormal   Collection Time    01/06/14  3:53 PM      Result Value Ref Range   Glucose-Capillary 116 (*) 70 - 99 mg/dL  GLUCOSE, CAPILLARY     Status: Abnormal   Collection Time    01/06/14  9:36 PM      Result Value Ref Range   Glucose-Capillary 102 (*) 70 - 99 mg/dL  CBC WITH DIFFERENTIAL     Status: Abnormal   Collection Time    01/07/14  2:43 AM      Result Value Ref Range   WBC 8.9  4.0 - 10.5 K/uL   RBC 3.09 (*) 3.87 - 5.11 MIL/uL   Hemoglobin 9.1 (*) 12.0 - 15.0 g/dL   HCT 28.2 (*) 36.0 - 46.0 %   MCV 91.3  78.0 - 100.0 fL   MCH 29.4  26.0 - 34.0 pg   MCHC 32.3  30.0 - 36.0 g/dL   RDW 14.0  11.5 - 15.5 %   Platelets 277  150 - 400 K/uL   Comment: DELTA CHECK NOTED   Neutrophils Relative % 73  43 - 77 %   Lymphocytes Relative 13  12 - 46 %   Monocytes Relative 12  3 - 12 %   Eosinophils Relative 1  0 - 5 %   Basophils Relative 1  0 - 1 %   Neutro Abs 6.4  1.7 - 7.7 K/uL   Lymphs Abs 1.2  0.7 - 4.0 K/uL   Monocytes Absolute 1.1 (*) 0.1 - 1.0 K/uL   Eosinophils Absolute 0.1  0.0 - 0.7 K/uL   Basophils Absolute 0.1  0.0 - 0.1 K/uL   Smear Review MORPHOLOGY UNREMARKABLE    RENAL FUNCTION PANEL     Status: Abnormal   Collection Time    01/07/14  2:43 AM  Result Value Ref Range   Sodium 141  137 - 147 mEq/L   Potassium 4.4  3.7 - 5.3 mEq/L   Chloride 101  96 - 112 mEq/L   CO2 16 (*) 19 - 32 mEq/L   Glucose, Bld 92  70 - 99 mg/dL   BUN 77 (*) 6 - 23 mg/dL   Creatinine, Ser 4.82 (*) 0.50 - 1.10 mg/dL   Calcium 8.7  8.4 - 10.5 mg/dL   Phosphorus 6.9 (*) 2.3 - 4.6 mg/dL   Albumin 2.1 (*) 3.5 - 5.2 g/dL   GFR calc non Af Amer 8 (*) >90 mL/min   GFR calc Af Amer 9 (*) >90 mL/min   Comment: (NOTE)     The eGFR has been calculated using the CKD EPI equation.     This calculation has not been validated in all clinical situations.     eGFR's persistently <90 mL/min signify  possible Chronic Kidney     Disease.  GLUCOSE, CAPILLARY     Status: Abnormal   Collection Time    01/07/14  8:07 AM      Result Value Ref Range   Glucose-Capillary 102 (*) 70 - 99 mg/dL    No results found.  Review of Systems  Constitutional: Negative.   HENT: Negative.   Eyes: Negative.   Respiratory: Negative.   Cardiovascular: Negative.   Gastrointestinal: Negative.   Genitourinary: Negative.   Musculoskeletal: Negative.   Skin: Negative.   Psychiatric/Behavioral: Negative.    Blood pressure 208/51, pulse 80, temperature 97.8 F (36.6 C), temperature source Oral, resp. rate 17, height 5' 8"  (1.727 m), weight 76 kg (167 lb 8.8 oz), SpO2 96.00%. Physical Exam  Constitutional: She appears well-developed.  HENT:  Head: Normocephalic and atraumatic.  Eyes: Conjunctivae and EOM are normal. Pupils are equal, round, and reactive to light.  Cardiovascular: Normal rate.   Respiratory: Effort normal.  GI: Soft.  Neurological: She is alert.  Psychiatric: She has a normal mood and affect. Her behavior is normal.    Assessment/Plan: Recommend silvadene daily with dial soap cleaning, elevation and we will re-evaluate. Recommend prealbumin check and strict blood sugar control.  [place] 01/07/2014, 10:10 AM

## 2014-01-07 NOTE — Progress Notes (Signed)
Follow-up:   Spoke with Dr Drue SecondSnider w/ ID service earlier this evening who discussed consult and recommendations for pt's (L) FA cellulitis. Stated she would speak with surgery tonight regarding her concerns that the cellulitis was worsening. She ask that I reassess pt sometime this shift and refer to recommendations if cellulitis progressing. At bedside pt noted resting in NAD. Pt's (L) FA and (L) upper arm noted w/ marked margins. The erythema and swelling has not progressed beyond the margins placed earlier this evening. Pt continues to reports pain though unable to tell me where her pain is and RN assures me that pt is receiving pain meds as often as she is able to get it per order. Pt remains afebrile. Will defer any changes in pt's current plan of care to rounding MD. Will continue to monitor closely.   Leanne ChangKatherine P. Schorr, NP-C [facility] Pager [phone]

## 2014-01-07 NOTE — Progress Notes (Signed)
Subjective: Pt w/o c/o of l arm pain.  Able to move arm w/o pain.   Objective: Vital signs in last 24 hours: Temp:  [98 F (36.7 C)-98.4 F (36.9 C)] 98.4 F (36.9 C) (06/25 0333) Resp:  [11-16] 11 (06/25 0000) BP: (175-210)/(31-57) 185/43 mmHg (06/25 0000) SpO2:  [93 %-98 %] 96 % (06/25 0000) Weight:  [167 lb 8.8 oz (76 kg)] 167 lb 8.8 oz (76 kg) (06/25 0333)  Intake/Output from previous day: 06/24 0701 - 06/25 0700 In: 600 [P.O.:120; I.V.:230; IV Piggyback:250] Out: 2475 [Urine:2475] Intake/Output this shift:     Recent Labs  01/05/14 0245 01/07/14 0243  HGB 8.1* 9.1*    Recent Labs  01/05/14 0245 01/07/14 0243  WBC 7.9 8.9  RBC 2.70* 3.09*  HCT 24.9* 28.2*  PLT 188 277    Recent Labs  01/06/14 0311 01/07/14 0243  NA 139 141  K 4.8 4.4  CL 101 101  CO2 16* 16*  BUN 73* 77*  CREATININE 4.81* 4.82*  GLUCOSE 104* 92  CALCIUM 8.7 8.7   No results found for this basename: LABPT, INR,  in the last 72 hours  Neurologically intact Neurovascular intact Sensation intact distally Intact pulses distally erythema markedly decreased over forearm.  Redness has spread over upper arm but is less angry appearing.  Areas of epidermolysis are stable.  Assessment/Plan: Persistent cellulitis w/o any signs of deep space infection.  Better in forearm and slightly spreading erythema over upper arm but less angry.  Also no signs of jt involvement.// will continue to follow.    GRAVES,JOHN L 01/07/2014, 7:56 AM

## 2014-01-07 NOTE — Progress Notes (Signed)
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  She presented with complaints of left upper extremity pain, swelling, erythema, having an associated functional decline, generalized weakness, feeling ill. Patient found to have severe left upper extremity cellulitis, with initial lab work showing white count of 3000 with presence of bands, temperature 101.1, development of acute on chronic renal failure. Given severity of presentation a CT scan of her left upper extremity was obtained in the emergency room which revealed severe diffuse edematous changes consistent with severe cellulitis. She was initially started on broad-spectrum IV antibiotic therapy with vancomycin and Zosyn. General surgery was consulted.  She showed little improvement over the following days, having further deterioration to kidney function as well as becoming more encephalopathic. Blood cultures obtained on 01/01/2014, back positive for group A streptococcus in both sets. Repeat blood cultures were obtained. General surgery recommended orthopedic surgery consultation.  On 01/04/2014 patient was seen and evaluated by Dr. Luiz BlareGraves of orthopedic surgery. He recommended ongoing IV antimicrobial therapy and did not feel surgical intervention was warranted at this time. Dr. Drue SecondSnider of infectious disease was consulted and recommended the discontinuation of vancomycin and Zosyn and starting Clindamycin and Ancef. With patient's deterioration to kidney function Dr. Kathrene BongoGoldsborough of nephrology was consulted as well. Though initially we had planned on re-imaging her arm, speaking with Dr. Luiz BlareGraves, he did  not recommend pursuing MRI.  Plastic surgery consult placed 6/25 and kept NPO for potenttial surgery                                                                             Assessment/Plan: 1. Sepsis -Present on admission, evidence by leukopenia  3000, presence of bands, temperature of 101.1, respiratory rate of 27, having positive blood cultures -Source of infection likely to be left upper extremity cellulitis.  -Left upper extremity appearing stable -Continue supportive care, IV fluids, IV antimicrobial therapy -Microbiology reporting that blood cultures positive for Group A Strep x 2 sets  Repeat blood cultures drawn on 01/02/2014 showing no growth.  -ID consulted recommended  to Clindamycin and Ancef on 6/22 -WOC nurse asked to assess area -Plastic surgery Dr. Georgiana ShoreSanger consulted-appreciate input  2.  Left upper extremity cellulitis -CT scan of left upper extremity showing severe diffuse changes consistent with cellulitis.  -Will continue IV AB coverage, now on Clindamycin and Ancef -Wound care, General Surgery, plastic surgery and Orthopedics consulted -Blood cultures growing Group A Strep  3.  Acute on Chronic Renal Failure -Patient with history of stage IV CKD, baseline creatinine near 3.4.  -AM lab work showing creatinine of 4/76 -likely worsened by IV contrast 01/01/14 -Placed on bicard gtt, now BMP showing bicarb of 19. Will stop bicarb and switch to NS -Dr Kathrene BongoGoldsborough of Nephrology input appreciated   4. Chronic diastolic CHF -Compensated.  -Diuretics stopped on admission.  -Follow volume status closely as she is getting IV fluids  5.  Metabolic Acidosis -Bicab at 16  on 01/06/2014, likely related to renal failure.   6. Staphylococcal pyelonephritis -On broad spectrum AB therapy  7. Type 2 diabetes Mellitus -Continue accuchecks ac and hs  Code Status: Full Family Communication: called and  Disposition Plan: Continue IV fluids, IV  AB's   Consultants:  Surgery   Antibiotics:  Vanc  Zosyn  HPI/Subjective:  Alert. Some confusion 'Ineed to go to the bathroom'  ' cannot go'   Objective: Filed Vitals:   01/07/14 0800  BP: 208/51  Pulse:   Temp: 97.8 F (36.6 C)  Resp: 17    Intake/Output Summary (Last 24 hours) at 01/07/14 0843 Last data filed at 01/07/14 0800  Gross per 24 hour  Intake    610 ml  Output   2700 ml  Net  -2090 ml   Filed Weights   01/05/14 0500 01/06/14 0400 01/07/14 0333  Weight: 72.6 kg (160 lb 0.9 oz) 72.1 kg (158 lb 15.2 oz) 76 kg (167 lb 8.8 oz)    Exam:   General:  Patient is lethargic, confused, can not follow commands  Cardiovascular: Regular rate and rhythm, normal S1S2  Respiratory: Clear to auscultation, normal inspiratory effort  Abdomen: Soft nontender nondistended  Musculoskeletal: There is some decrease in swelling of left upper extremity, there is ongoing weeping         Data Reviewed: Basic Metabolic Panel:  Recent Labs Lab 01/03/14 0235 01/04/14 0217 01/05/14 0245 01/06/14 0311 01/07/14 0243  NA 136* 134* 137 139 141  K 4.9 4.8 4.4 4.8 4.4  CL 101 102 100 101 101  CO2 16* 15* 19 16* 16*  GLUCOSE 86 87 104* 104* 92  BUN 77* 77* 76* 73* 77*  CREATININE 4.61* 4.72* 4.76* 4.81* 4.82*  CALCIUM 8.6 8.4 8.6 8.7 8.7  PHOS  --   --   --  6.3* 6.9*   Liver Function Tests:  Recent Labs Lab 01/01/14 1339 01/02/14 0316 01/06/14 0311 01/07/14 0243  AST 34 34  --   --   ALT 12 12  --   --   ALKPHOS 42 40  --   --   BILITOT 0.4 0.2*  --   --   PROT 6.3 5.8*  --   --   ALBUMIN 2.6* 2.2* 2.0* 2.1*   No results found for this basename: LIPASE, AMYLASE,  in the last 168 hours No results found for this basename: AMMONIA,  in the last 168 hours CBC:  Recent Labs Lab 01/01/14 1339  01/02/14 0316 01/03/14 0235 01/04/14 0217 01/05/14 0245 01/07/14 0243  WBC 3.0*  < > 4.0 5.9 8.9 7.9 8.9  NEUTROABS 2.4  --   --   --   --   --  6.4   HGB 10.3*  < > 9.3* 8.9* 8.3* 8.1* 9.1*  HCT 32.0*  < > 29.5* 27.8* 25.7* 24.9* 28.2*  MCV 97.0  < > 94.6 94.9 93.1 92.2 91.3  PLT 145*  < > 142* 157 171 188 277  < > = values in this interval not displayed. Cardiac Enzymes: No results found for this basename: CKTOTAL, CKMB, CKMBINDEX, TROPONINI,  in the last 168 hours BNP (last 3 results)  Recent Labs  07/28/13 1650  PROBNP 32203.0*   CBG:  Recent Labs Lab 01/06/14 0748 01/06/14 1144 01/06/14 1553 01/06/14 2136 01/07/14 0807  GLUCAP 94 105* 116* 102* 102*    Recent Results (from the past 240 hour(s))  URINE CULTURE     Status: None   Collection  Time    01/01/14 12:34 PM      Result Value Ref Range Status   Specimen Description URINE, CLEAN CATCH   Final   Special Requests NONE   Final   Culture  Setup Time     Final   Value: 01/01/2014 13:10     Performed at [facility] Count     Final   Value: >=100,000 COLONIES/ML     Performed at [facility]   Culture     Final   Value: STAPHYLOCOCCUS AUREUS     Note: RIFAMPIN AND GENTAMICIN SHOULD NOT BE USED AS SINGLE DRUGS FOR TREATMENT OF STAPH INFECTIONS.     Performed at [facility]   Report Status 01/03/2014 FINAL   Final   Organism ID, Bacteria STAPHYLOCOCCUS AUREUS   Final  CULTURE, BLOOD (ROUTINE X 2)     Status: None   Collection Time    01/01/14  1:50 PM      Result Value Ref Range Status   Specimen Description BLOOD ARM RIGHT   Final   Special Requests     Final   Value: BOTTLES DRAWN AEROBIC AND ANAEROBIC BLUE 10CC RED 5CC   Culture  Setup Time     Final   Value: 01/01/2014 22:00     Performed at [facility]   Culture     Final   Value: GROUP A STREP (S.PYOGENES) ISOLATED     Note: FAXED TO [place] [facility] ATTN CYNTHIA GRANTHAM [id number] BY PEAKY     Note: Gram Stain Report Called to,Read Back By and Verified With: [facility] LAFFARD 01/02/14 @ 12:27PM BY RUSCOE A. CRITICAL RESULT CALLED TO, READ BACK BY AND VERIFIED  WITH: STUART WATERS 01/03/14 @ 9:25AM BY RUSCOE A.     Performed at [facility]   Report Status 01/04/2014 FINAL   Final  CULTURE, BLOOD (ROUTINE X 2)     Status: None   Collection Time    01/01/14  2:00 PM      Result Value Ref Range Status   Specimen Description BLOOD HAND RIGHT   Final   Special Requests BOTTLES DRAWN AEROBIC ONLY 10CC   Final   Culture  Setup Time     Final   Value: 01/01/2014 22:01     Performed at [facility]   Culture     Final   Value: GROUP A STREP (S.PYOGENES) ISOLATED     Note: FAXED TO [place] [facility] ATTN CYNTHIA GRANTHAM [id number] BY PEAKY     Note: Gram Stain Report Called to,Read Back By and Verified With: [facility] LAFFARD 01/02/14 @ 12:27PM BY RUSCOE A. CRITICAL RESULT CALLED TO, READ BACK BY AND VERIFIED WITH: STUART WATERS 01/03/14 @ 9:25AM BY RUSCOE A.     Performed at [facility]   Report Status 01/04/2014 FINAL   Final  MRSA PCR SCREENING     Status: None   Collection Time    01/01/14  7:00 PM      Result Value Ref Range Status   MRSA by PCR NEGATIVE  NEGATIVE Final   Comment:            The GeneXpert MRSA Assay (FDA     approved for NASAL specimens     only), is one component of a     comprehensive MRSA colonization     surveillance program. It is not     intended to diagnose MRSA  infection nor to guide or     monitor treatment for     MRSA infections.  CULTURE, BLOOD (ROUTINE X 2)     Status: None   Collection Time    01/01/14  8:16 PM      Result Value Ref Range Status   Specimen Description BLOOD RIGHT HAND   Final   Special Requests BOTTLES DRAWN AEROBIC ONLY 2CC   Final   Culture  Setup Time     Final   Value: 01/02/2014 00:46     Performed at [facility]   Culture     Final   Value:        BLOOD CULTURE RECEIVED NO GROWTH TO DATE CULTURE WILL BE HELD FOR 5 DAYS BEFORE ISSUING A FINAL NEGATIVE REPORT     Performed at [facility]   Report Status PENDING   Incomplete  CULTURE, BLOOD  (ROUTINE X 2)     Status: None   Collection Time    01/01/14  8:23 PM      Result Value Ref Range Status   Specimen Description BLOOD RIGHT ARM   Final   Special Requests     Final   Value: BOTTLES DRAWN AEROBIC AND ANAEROBIC 10CC AER,6CC ANA   Culture  Setup Time     Final   Value: 01/02/2014 00:46     Performed at [facility]   Culture     Final   Value:        BLOOD CULTURE RECEIVED NO GROWTH TO DATE CULTURE WILL BE HELD FOR 5 DAYS BEFORE ISSUING A FINAL NEGATIVE REPORT     Performed at [facility]   Report Status PENDING   Incomplete  CULTURE, BLOOD (ROUTINE X 2)     Status: None   Collection Time    01/02/14  2:25 PM      Result Value Ref Range Status   Specimen Description BLOOD RIGHT HAND   Final   Special Requests BOTTLES DRAWN AEROBIC ONLY 5CC   Final   Culture  Setup Time     Final   Value: 01/02/2014 22:28     Performed at [facility]   Culture     Final   Value:        BLOOD CULTURE RECEIVED NO GROWTH TO DATE CULTURE WILL BE HELD FOR 5 DAYS BEFORE ISSUING A FINAL NEGATIVE REPORT     Performed at [facility]   Report Status PENDING   Incomplete  CULTURE, BLOOD (ROUTINE X 2)     Status: None   Collection Time    01/02/14  2:40 PM      Result Value Ref Range Status   Specimen Description BLOOD RIGHT ARM   Final   Special Requests BOTTLES DRAWN AEROBIC AND ANAEROBIC 10CC EACH   Final   Culture  Setup Time     Final   Value: 01/02/2014 22:28     Performed at [facility]   Culture     Final   Value:        BLOOD CULTURE RECEIVED NO GROWTH TO DATE CULTURE WILL BE HELD FOR 5 DAYS BEFORE ISSUING A FINAL NEGATIVE REPORT     Performed at [facility]   Report Status PENDING   Incomplete     Studies: No results found.  Scheduled Meds: . amLODipine  10 mg Oral Daily  . antiseptic oral rinse  15 mL Mouth Rinse q12n4p  .  antiseptic oral rinse  15 mL Mouth Rinse BID  . aspirin EC  81 mg Oral Daily  .  ceFAZolin (ANCEF)  IV  1 g Intravenous Q24H  . chlorhexidine  15 mL Mouth Rinse BID  . clindamycin (CLEOCIN) IV  600 mg Intravenous 3 times per day  . darbepoetin (ARANESP) injection - NON-DIALYSIS  100 mcg Subcutaneous Q Tue-1800  . docusate sodium  100 mg Oral BID  . feeding supplement (ENSURE COMPLETE)  237 mL Oral BID BM  . furosemide  80 mg Intravenous Q12H  . heparin  5,000 Units Subcutaneous 3 times per day  . hydrALAZINE  150 mg Oral TID  . insulin aspart  0-15 Units Subcutaneous TID WC  . insulin aspart  0-5 Units Subcutaneous QHS  . polyethylene glycol  17 g Oral Daily  . sodium bicarbonate  650 mg Oral BID  . sodium chloride  3 mL Intravenous Q12H   Continuous Infusions: . sodium chloride 10 mL/hr at 01/07/14 0800    Principal Problem:   Cellulitis Active Problems:   Hypertensive heart disease   Tobacco dependency   Chronic diastolic CHF (congestive heart failure)   Sepsis   Acute on chronic renal failure   Diabetes mellitus with peripheral vascular disease    Time spent: 45 min  Pleas KochJai Samtani, MD [facility] [phone](P) [phone]

## 2014-01-07 NOTE — Progress Notes (Signed)
Subjective:  Events noted- blood pressure has continued to be high- UOP actually great with addition of lasix- renal function stable- arm seems to be improving- wants a laxative Objective Vital signs in last 24 hours: Filed Vitals:   01/06/14 2215 01/06/14 2324 01/07/14 0000 01/07/14 0333  BP:   185/43   Pulse:      Temp:  98.3 F (36.8 C)  98.4 F (36.9 C)  TempSrc:  Oral  Oral  Resp: 12  11   Height:      Weight:    76 kg (167 lb 8.8 oz)  SpO2: 95%  96%    Weight change: 3.9 kg (8 lb 9.6 oz)  Intake/Output Summary (Last 24 hours) at 01/07/14 0804 Last data filed at 01/07/14 0600  Gross per 24 hour  Intake    590 ml  Output   2475 ml  Net  -1885 ml    Assessment/Plan: 80 year old white female with advanced CKD at baseline. She now has acute on chronic renal failure in the setting of a significant upper extremity cellulitis/group A strep bacteremia and multiple antibiotics as well as a UTI  1.Renal- advanced CKD- stage 4 at baseline. Now with acute on chronic kidney disease in the setting of cellulitis/bacteremia/UTI/antibiotics. I do not see any direct clear-cut insult. Her decreased kidney function is likely based on this whole picture of infection/bacteremia/antibiotics/UTI. She is on an appropriate antibiotics for her urinary tract infection/bacteremia as ID is now involved. There is no obvious need for renal replacement therapy at this time.    I was hoping function was plateauing.  Son tells me that she was considering not pursuing dialysis at all but no decision made.  I did tell him that dialysis was a possibility this hospitalization if things get worse.  Continue lasix for now 2. Hypertension/volume - hypertensive. Is on scheduled hydralazine and Norvasc. Inderal added back. Has been very positive fluid wise, diuresing well.  Will continue with lasix to assist with blood pressure control- also add PRN clonidine 3. ID- severe cellulitis of her left arm as well as urinary tract  infection and bacteremia. Infectious disease is now involved. She is now on Ancef as well as clindamycin. Surgical input has been obtained as well. Her arm clinically seems better 4. Anemia - is likely multifactorial. Iron stores low but hesitant to give IV iron given bacteremia, have started Aranesp- is improved 5. MS- poor- infection vs pain meds vs uremia- not really improving 6. Acidosis-  PO bicarb 7. Constipation- maybe due to narcotics; add mirilax    GOLDSBOROUGH,KELLIE A    Labs: Basic Metabolic Panel:  Recent Labs Lab 01/05/14 0245 01/06/14 0311 01/07/14 0243  NA 137 139 141  K 4.4 4.8 4.4  CL 100 101 101  CO2 19 16* 16*  GLUCOSE 104* 104* 92  BUN 76* 73* 77*  CREATININE 4.76* 4.81* 4.82*  CALCIUM 8.6 8.7 8.7  PHOS  --  6.3* 6.9*   Liver Function Tests:  Recent Labs Lab 01/01/14 1339 01/02/14 0316 01/06/14 0311 01/07/14 0243  AST 34 34  --   --   ALT 12 12  --   --   ALKPHOS 42 40  --   --   BILITOT 0.4 0.2*  --   --   PROT 6.3 5.8*  --   --   ALBUMIN 2.6* 2.2* 2.0* 2.1*   No results found for this basename: LIPASE, AMYLASE,  in the last 168 hours No results found for this  basename: AMMONIA,  in the last 168 hours CBC:  Recent Labs Lab 01/01/14 1339  01/02/14 0316 01/03/14 0235 01/04/14 0217 01/05/14 0245 01/07/14 0243  WBC 3.0*  < > 4.0 5.9 8.9 7.9 8.9  NEUTROABS 2.4  --   --   --   --   --  6.4  HGB 10.3*  < > 9.3* 8.9* 8.3* 8.1* 9.1*  HCT 32.0*  < > 29.5* 27.8* 25.7* 24.9* 28.2*  MCV 97.0  < > 94.6 94.9 93.1 92.2 91.3  PLT 145*  < > 142* 157 171 188 277  < > = values in this interval not displayed. Cardiac Enzymes: No results found for this basename: CKTOTAL, CKMB, CKMBINDEX, TROPONINI,  in the last 168 hours CBG:  Recent Labs Lab 01/05/14 2140 01/06/14 0748 01/06/14 1144 01/06/14 1553 01/06/14 2136  GLUCAP 91 94 105* 116* 102*    Iron Studies:   Recent Labs  01/05/14 0245 01/05/14 1347  IRON 22*  --   TIBC 119*  --    FERRITIN  --  155   Studies/Results: No results found. Medications: Infusions: . sodium chloride 10 mL/hr at 01/06/14 1900    Scheduled Medications: . amLODipine  10 mg Oral Daily  . antiseptic oral rinse  15 mL Mouth Rinse q12n4p  . antiseptic oral rinse  15 mL Mouth Rinse BID  . aspirin EC  81 mg Oral Daily  .  ceFAZolin (ANCEF) IV  1 g Intravenous Q24H  . chlorhexidine  15 mL Mouth Rinse BID  . clindamycin (CLEOCIN) IV  600 mg Intravenous 3 times per day  . darbepoetin (ARANESP) injection - NON-DIALYSIS  100 mcg Subcutaneous Q Tue-1800  . docusate sodium  100 mg Oral BID  . feeding supplement (ENSURE COMPLETE)  237 mL Oral BID BM  . furosemide  80 mg Intravenous Q12H  . heparin  5,000 Units Subcutaneous 3 times per day  . hydrALAZINE  150 mg Oral TID  . insulin aspart  0-15 Units Subcutaneous TID WC  . insulin aspart  0-5 Units Subcutaneous QHS  . polyethylene glycol  17 g Oral Daily  . sodium bicarbonate  650 mg Oral BID  . sodium chloride  3 mL Intravenous Q12H    have reviewed scheduled and prn medications.  Physical Exam: General: somnolent- son at bedside Heart: RRR Lungs: mostly clear- poor effort Abdomen: obese, soft, non tender Extremities: mild edema- left arm bandaged in elevator- actually seems less red to me    01/07/2014,8:04 AM  LOS: 6 days

## 2014-01-08 ENCOUNTER — Other Ambulatory Visit: Payer: Self-pay | Admitting: Plastic Surgery

## 2014-01-08 LAB — CULTURE, BLOOD (ROUTINE X 2)
CULTURE: NO GROWTH
Culture: NO GROWTH
Culture: NO GROWTH
Culture: NO GROWTH

## 2014-01-08 LAB — RENAL FUNCTION PANEL
ALBUMIN: 2.1 g/dL — AB (ref 3.5–5.2)
BUN: 76 mg/dL — ABNORMAL HIGH (ref 6–23)
CO2: 19 mEq/L (ref 19–32)
Calcium: 8.7 mg/dL (ref 8.4–10.5)
Chloride: 101 mEq/L (ref 96–112)
Creatinine, Ser: 4.89 mg/dL — ABNORMAL HIGH (ref 0.50–1.10)
GFR calc non Af Amer: 8 mL/min — ABNORMAL LOW (ref >90)
GFR, EST AFRICAN AMERICAN: 9 mL/min — AB (ref >90)
Glucose, Bld: 83 mg/dL (ref 70–99)
PHOSPHORUS: 7.2 mg/dL — AB (ref 2.3–4.6)
POTASSIUM: 4.3 meq/L (ref 3.7–5.3)
Sodium: 141 mEq/L (ref 137–147)

## 2014-01-08 LAB — GLUCOSE, CAPILLARY
Glucose-Capillary: 86 mg/dL (ref 70–99)
Glucose-Capillary: 83 mg/dL (ref 70–99)
Glucose-Capillary: 94 mg/dL (ref 70–99)

## 2014-01-08 LAB — PREALBUMIN: PREALBUMIN: 8.4 mg/dL — AB (ref 17.0–34.0)

## 2014-01-08 NOTE — Progress Notes (Signed)
Dr Mahala MenghiniSamtani paged regarding advancing patient's diet. Eileen StanfordCarter, Sarah 01/08/2014 2:57 PM

## 2014-01-08 NOTE — Progress Notes (Signed)
Dr Kelly SplinterSanger and family at bedside. Will plan for surgery next week. Family and pt v/u. Okay to resume previous diet. Eileen StanfordCarter, Sarah 01/08/2014

## 2014-01-08 NOTE — Progress Notes (Signed)
NURSING PROGRESS NOTE  Tricia BeathMargie E [name] [id number] Transfer Data: 01/08/2014 7:30 PM Attending Aubreigh Fuerte: Rhetta MuraJai-Gurmukh Samtani, MD OZH:[id number],IONGEXPCP:ELKINS,WILSON Joelene MillinLIVER, MD Code Status: FUll  Tricia BeathMargie E [name] is a 80 y.o. female patient transferred from 2 heart  -No acute distress noted.  -No complaints of shortness of breath.  -No complaints of chest pain.   Cardiac Monitoring: Box #10 in place.Blood pressure 190/63, pulse 68, temperature 97.9 F (36.6 C), temperature source Oral, resp. rate 14, height 5\' 8"  (1.727 m), weight 73.4 kg (161 lb 13.1 oz), SpO2 92.00%.   Allergies:  Review of patient's allergies indicates no known allergies.  Past Medical History:   has a past medical history of Hypertension; Hyperlipidemia; Coronary artery disease; Old inferior wall myocardial infarction (1999); Peripheral vascular disease; Tobacco dependency; CKD (chronic kidney disease) stage 4, GFR 15-29 ml/min; Diastolic heart failure; Aortic stenosis; Type 2 diabetes mellitus with vascular disease; CKD (chronic kidney disease), stage IV; Hypertensive heart disease; Anxiety; Depression; Cataract; and Ovarian cyst.  Past Surgical History:   has past surgical history that includes Partial hysterectomy; Total vaginal hysterectomy; Leg amputation below knee (1996); Iliac vein angioplasty / stenting (08/10/2010 ); Coronary artery bypass graft (01/26/1998); and Cardiac catheterization.  Social History:   reports that she has been smoking Cigarettes.  She has a 30 pack-year smoking history. She has never used smokeless tobacco. She reports that she does not drink alcohol or use illicit drugs.  Skin: Bruising noted to Right hand.   Patient/Family orientated to room. Information packet given to patient/family. Admission inpatient armband information verified with patient/family to include name and date of birth and placed on patient arm. Side rails up x 2, fall assessment and education completed with patient/family. Patient/family  able to verbalize understanding of risk associated with falls and verbalized understanding to call for assistance before getting out of bed. Call light within reach. Patient/family able to voice and demonstrate understanding of unit orientation instructions.    Will continue to evaluate and treat per MD orders.

## 2014-01-08 NOTE — Progress Notes (Signed)
Subjective:  Events noted- blood pressure has continued to be high- UOP good on lasix- renal function stable- arm seems to be improving- mental status is much better  Objective Vital signs in last 24 hours: Filed Vitals:   01/08/14 0100 01/08/14 0400 01/08/14 0525 01/08/14 0750  BP:  181/43    Pulse: 64 70 71   Temp:  97.8 F (36.6 C)  98.4 F (36.9 C)  TempSrc:  Oral  Oral  Resp: 9 20 15 13   Height:      Weight:      SpO2: 98% 93% 96% 96%   Weight change:   Intake/Output Summary (Last 24 hours) at 01/08/14 0900 Last data filed at 01/08/14 0800  Gross per 24 hour  Intake   1020 ml  Output   1051 ml  Net    -31 ml    Assessment/Plan: 80 year old white female with advanced CKD at baseline. She now has acute on chronic renal failure in the setting of a significant upper extremity cellulitis/group A strep bacteremia and multiple antibiotics as well as a UTI  1.Renal- advanced CKD- stage 4 at baseline. Now with acute on chronic kidney disease in the setting of cellulitis/bacteremia/UTI/antibiotics. I do not see any direct clear-cut insult. Her decreased kidney function is likely based on this whole picture of infection/bacteremia/antibiotics/UTI. She is on an appropriate antibiotics for her urinary tract infection/bacteremia as ID is now involved. There is no obvious need for renal replacement therapy at this time.    I was hoping function was plateauing.  Son tells me that she was considering not pursuing dialysis at all but no decision made.  I did tell him that dialysis was a possibility this hospitalization if things get worse.  Continue lasix for now 2. Hypertension/volume - hypertensive. Is on scheduled hydralazine,  Norvasc and Inderal. Has been very positive fluid wise, diuresing on lasix.  Will continue with lasix to assist with blood pressure control- also have added PRN clonidine 3. ID- severe cellulitis of her left arm as well as urinary tract infection and bacteremia. Infectious  disease is now involved. She is now on Ancef as well as clindamycin. Surgical input has been obtained as well. Her arm clinically seems better 4. Anemia - is likely multifactorial. Iron stores low but hesitant to give IV iron given bacteremia, have started Aranesp- is improved 5. MS- poor- infection vs pain meds vs uremia- has improved 6. Acidosis-  PO bicarb 7. Constipation- maybe due to narcotics; added mirilax- reports results    GOLDSBOROUGH,KELLIE A    Labs: Basic Metabolic Panel:  Recent Labs Lab 01/06/14 0311 01/07/14 0243 01/08/14 0228  NA 139 141 141  K 4.8 4.4 4.3  CL 101 101 101  CO2 16* 16* 19  GLUCOSE 104* 92 83  BUN 73* 77* 76*  CREATININE 4.81* 4.82* 4.89*  CALCIUM 8.7 8.7 8.7  PHOS 6.3* 6.9* 7.2*   Liver Function Tests:  Recent Labs Lab 01/01/14 1339 01/02/14 0316 01/06/14 0311 01/07/14 0243 01/08/14 0228  AST 34 34  --   --   --   ALT 12 12  --   --   --   ALKPHOS 42 40  --   --   --   BILITOT 0.4 0.2*  --   --   --   PROT 6.3 5.8*  --   --   --   ALBUMIN 2.6* 2.2* 2.0* 2.1* 2.1*   No results found for this basename: LIPASE, AMYLASE,  in  the last 168 hours No results found for this basename: AMMONIA,  in the last 168 hours CBC:  Recent Labs Lab 01/01/14 1339  01/02/14 0316 01/03/14 0235 01/04/14 0217 01/05/14 0245 01/07/14 0243  WBC 3.0*  < > 4.0 5.9 8.9 7.9 8.9  NEUTROABS 2.4  --   --   --   --   --  6.4  HGB 10.3*  < > 9.3* 8.9* 8.3* 8.1* 9.1*  HCT 32.0*  < > 29.5* 27.8* 25.7* 24.9* 28.2*  MCV 97.0  < > 94.6 94.9 93.1 92.2 91.3  PLT 145*  < > 142* 157 171 188 277  < > = values in this interval not displayed. Cardiac Enzymes: No results found for this basename: CKTOTAL, CKMB, CKMBINDEX, TROPONINI,  in the last 168 hours CBG:  Recent Labs Lab 01/07/14 0807 01/07/14 1212 01/07/14 1644 01/07/14 2107 01/08/14 0748  GLUCAP 102* 92 82 109* 86    Iron Studies:   Recent Labs  01/05/14 1347  FERRITIN 155    Studies/Results: No results found. Medications: Infusions: . sodium chloride 10 mL/hr at 01/07/14 2205    Scheduled Medications: . amLODipine  10 mg Oral Daily  . antiseptic oral rinse  15 mL Mouth Rinse q12n4p  . antiseptic oral rinse  15 mL Mouth Rinse BID  . aspirin EC  81 mg Oral Daily  .  ceFAZolin (ANCEF) IV  1 g Intravenous Q24H  . chlorhexidine  15 mL Mouth Rinse BID  . clindamycin (CLEOCIN) IV  600 mg Intravenous 3 times per day  . darbepoetin (ARANESP) injection - NON-DIALYSIS  100 mcg Subcutaneous Q Tue-1800  . docusate sodium  100 mg Oral BID  . feeding supplement (ENSURE COMPLETE)  237 mL Oral BID BM  . furosemide  80 mg Intravenous Q12H  . heparin  5,000 Units Subcutaneous 3 times per day  . hydrALAZINE  150 mg Oral TID  . insulin aspart  0-15 Units Subcutaneous TID WC  . insulin aspart  0-5 Units Subcutaneous QHS  . polyethylene glycol  17 g Oral Daily  . silver sulfADIAZINE   Topical Daily  . sodium bicarbonate  650 mg Oral BID  . sodium chloride  3 mL Intravenous Q12H    have reviewed scheduled and prn medications.  Physical Exam: General: more alert Heart: RRR Lungs: mostly clear-  Abdomen: obese, soft, non tender Extremities: mild edema- left arm bandaged out of elevator- actually seems less red to me    01/08/2014,9:00 AM  LOS: 7 days

## 2014-01-08 NOTE — Progress Notes (Signed)
  Subjective: The patient is a 80 yrs old wf here for treatment of her left arm swelling.  The   Objective: Vital signs in last 24 hours: Temp:  [97.5 F (36.4 C)-98.4 F (36.9 C)] 97.9 F (36.6 C) (06/26 1600) Pulse Rate:  [64-75] 73 (06/26 1500) Resp:  [8-20] 12 (06/26 1500) BP: (160-210)/(25-122) 192/38 mmHg (06/26 1600) SpO2:  [88 %-98 %] 93 % (06/26 1600) Weight:  [73.4 kg (161 lb 13.1 oz)] 73.4 kg (161 lb 13.1 oz) (06/26 0800) Last BM Date: 01/08/14  Intake/Output from previous day: 06/25 0701 - 06/26 0700 In: 955 [I.V.:235; IV Piggyback:200] Out: 1276 [Urine:1275; Stool:1] Intake/Output this shift: Total I/O In: 340 [I.V.:90; Other:150; IV Piggyback:100] Out: 900 [Urine:900]  General appearance: alert, cooperative and no distress  Lab Results:   Recent Labs  01/07/14 0243  WBC 8.9  HGB 9.1*  HCT 28.2*  PLT 277   BMET  Recent Labs  01/07/14 0243 01/08/14 0228  NA 141 141  K 4.4 4.3  CL 101 101  CO2 16* 19  GLUCOSE 92 83  BUN 77* 76*  CREATININE 4.82* 4.89*  CALCIUM 8.7 8.7   PT/INR No results found for this basename: LABPROT, INR,  in the last 72 hours ABG No results found for this basename: PHART, PCO2, PO2, HCO3,  in the last 72 hours  Studies/Results: No results found.  Anti-infectives: Anti-infectives   Start     Dose/Rate Route Frequency Ordered Stop   01/06/14 2200  clindamycin (CLEOCIN) IVPB 600 mg     600 mg 100 mL/hr over 30 Minutes Intravenous 3 times per day 01/06/14 2014     01/04/14 1400  clindamycin (CLEOCIN) IVPB 600 mg  Status:  Discontinued     600 mg 100 mL/hr over 30 Minutes Intravenous 3 times per day 01/04/14 1309 01/06/14 1710   01/04/14 1400  ceFAZolin (ANCEF) IVPB 1 g/50 mL premix     1 g 100 mL/hr over 30 Minutes Intravenous Every 24 hours 01/04/14 1309     01/03/14 2200  vancomycin (VANCOCIN) IVPB 1000 mg/200 mL premix  Status:  Discontinued     1,000 mg 200 mL/hr over 60 Minutes Intravenous Every 48 hours  01/01/14 1910 01/04/14 1309   01/03/14 1600  vancomycin (VANCOCIN) IVPB 1000 mg/200 mL premix  Status:  Discontinued     1,000 mg 200 mL/hr over 60 Minutes Intravenous Every 48 hours 01/01/14 1513 01/01/14 1903   01/01/14 2359  piperacillin-tazobactam (ZOSYN) IVPB 2.25 g  Status:  Discontinued     2.25 g 100 mL/hr over 30 Minutes Intravenous 4 times per day 01/01/14 1729 01/04/14 1309   01/01/14 1730  piperacillin-tazobactam (ZOSYN) IVPB 3.375 g  Status:  Discontinued     3.375 g 100 mL/hr over 30 Minutes Intravenous  Once 01/01/14 1729 01/04/14 1439   01/01/14 1515  vancomycin (VANCOCIN) 1,500 mg in sodium chloride 0.9 % 500 mL IVPB     1,500 mg 250 mL/hr over 120 Minutes Intravenous  Once 01/01/14 1513 01/03/14 0700   01/01/14 1445  cefTRIAXone (ROCEPHIN) 1 g in dextrose 5 % 50 mL IVPB     1 g 100 mL/hr over 30 Minutes Intravenous  Once 01/01/14 1443 01/01/14 1544      Assessment/Plan: s/p * No surgery found * Continue silvadene dressings 1 -2 x daily.  Plan for surgery next week..  LOS: 7 days    [facility] HospitalANGER,CLAIRE 01/08/2014

## 2014-01-08 NOTE — Progress Notes (Signed)
Dr Mahala MenghiniSamtani returned page. Orders received to check with Dr Kelly SplinterSanger regarding NPO status. Okay for pt to transfer to floor with telemetry. MD to place transfer order in Epic. Will contact Dr Kelly SplinterSanger with follow up. Eileen StanfordCarter, Sarah 01/08/2014 1502

## 2014-01-08 NOTE — Progress Notes (Signed)
Message left with Dr Kelly SplinterSanger regarding patient NPO status and plan.Tricia [name], Tricia [name] 01/08/2014 3:29 PM

## 2014-01-08 NOTE — Progress Notes (Signed)
Centralized telemetry notified that pt placed on tele, box 19, and is transferring to 5W room 30. Montez Moritaarter, Sarah.01/08/2014 1745
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
## 2014-01-08 NOTE — Progress Notes (Signed)
Report given to Florentina AddisonKatie, RN on [facility]. Pt and familiy updated with transfer/plan of care and verbalize understanding. Eileen StanfordCarter, Sarah 01/08/2014 6:02 PM

## 2014-01-08 NOTE — Progress Notes (Signed)
See that Dr. Kelly SplinterSanger has taken over management of upper extremity skin care.  Feel relatively confident that there is no deep space infection so will sign off for now but would be happy to re-eval if there is any further concern for deep space infection.

## 2014-01-08 NOTE — Progress Notes (Signed)
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  She presented with complaints of left upper extremity pain, swelling, erythema, having an associated functional decline, generalized weakness, feeling ill. Patient found to have severe left upper extremity cellulitis, with initial lab work showing white count of 3000 with presence of bands, temperature 101.1, development of acute on chronic renal failure. Given severity of presentation a CT scan of her left upper extremity was obtained in the emergency room which revealed severe diffuse edematous changes consistent with severe cellulitis. She was initially started on broad-spectrum IV antibiotic therapy with vancomycin and Zosyn. General surgery was consulted.  She showed little improvement over the following days, having further deterioration to kidney function as well as becoming more encephalopathic. Blood cultures obtained on 01/01/2014, back positive for group A streptococcus in both sets. Repeat blood cultures were obtained. General surgery recommended orthopedic surgery consultation.  On 01/04/2014 patient was seen and evaluated by Dr. Luiz BlareGraves of orthopedic surgery. He recommended ongoing IV antimicrobial therapy and did not feel surgical intervention was warranted at this time. Dr. Drue SecondSnider of infectious disease was consulted and recommended the discontinuation of vancomycin and Zosyn and starting Clindamycin and Ancef. With patient's deterioration to kidney function Dr. Kathrene BongoGoldsborough of nephrology was consulted as well. Though initially we had planned on re-imaging her arm, speaking with Dr. Luiz BlareGraves, he did  not recommend pursuing MRI.  Plastic surgery consult placed 6/25 and kept NPO for potenttial surgery                                                                             Assessment/Plan: 1. Sepsis -Present on admission, evidence by leukopenia  3000, presence of bands, temperature of 101.1, respiratory rate of 27, having positive blood cultures--Microbiology reporting that blood cultures positive for Group A Strep x 2 sets  Repeat blood cultures drawn on 01/02/2014 showing no growth.   Note Urine also + for >100,000 Staph -Source of infection likely to be left upper extremity cellulitis.  -ID consulted recommended  to Clindamycin and Ancef on 6/22 -WOC nurse asked to assess area -Plastic surgery Dr. Georgiana ShoreSanger consulted-appreciate input -wound clinically appears better.  Is npo for possible debridement-defer decision to Plastic surgery-may resume diet when seen by them and decision made  2.  Left upper extremity cellulitis -CT scan of left upper extremity showing severe diffuse changes consistent with cellulitis.  -Will continue IV AB coverage, now on Clindamycin and Ancef -Wound care, General Surgery, plastic surgery and Orthopedics consulted -Blood cultures growing Group A Strep -prealbumin 8.4 indicating poor protein reserves   3.  Acute on Chronic Renal Failure -Patient with history of stage IV CKD, baseline creatinine near 3.4.  -AM lab work showing creatinine of 4/76 -likely worsened by IV contrast 01/01/14 -Placed on bicard gtt, now BMP showing bicarb of 19. Will stop bicarb and switch to NS -Dr Kathrene BongoGoldsborough of Nephrology input appreciated  -unlcear if would want dialysis  4. Chronic diastolic CHF -Compensated.  -Diuretics stopped on admission.  -Follow volume status closely as she is getting IV fluids  5.  Metabolic Acidosis -Bicab at 19 on 01/08/2014, likely related to renal failure but resolving  6. Staphylococcal pyelonephritis -On broad spectrum AB therapy  7. Type  2 diabetes Mellitus -Continue accuchecks ac and hs  8. Toxic metabolic encephalopathy probably 2/2 to uremia/sepsis -much improved compared to yesterday -monitor for delirium  Code Status: Full Family Communication: called and  Disposition Plan: Continue IV fluids, IV AB's   Consultants:  Surgery   Antibiotics:  Vanc  Zosyn  HPI/Subjective:  Alert. Much more oriented. realizes she was a little confused yesterday Pain is moderate in L arm No n/v/diarr Pain in sacrum and has bed-sore per RN    Objective: Filed Vitals:   01/08/14 0750  BP:   Pulse:   Temp: 98.4 F (36.9 C)  Resp: 13    Intake/Output Summary (Last 24 hours) at 01/08/14 1016 Last data filed at 01/08/14 0900  Gross per 24 hour  Intake   1220 ml  Output   1051 ml  Net    169 ml   Filed Weights   01/06/14 0400 01/07/14 0333 01/08/14 0800  Weight: 72.1 kg (158 lb 15.2 oz) 76 kg (167 lb 8.8 oz) 73.4 kg (161 lb 13.1 oz)    Exam:   General:  Patient is lethargic, confused, can not follow commands  Cardiovascular: Regular rate and rhythm, normal S1S2  Respiratory: Clear to auscultation, normal inspiratory effort  Abdomen: Soft nontender nondistended  Musculoskeletal: There is some decrease in swelling of left upper extremity, there is ongoing weeping          Data Reviewed: Basic Metabolic Panel:  Recent Labs Lab 01/04/14 0217 01/05/14 0245 01/06/14 0311 01/07/14 0243 01/08/14 0228  NA 134* 137 139 141 141  K 4.8 4.4 4.8 4.4 4.3  CL 102 100 101 101 101  CO2 15* 19 16* 16* 19  GLUCOSE 87 104* 104* 92 83  BUN 77* 76* 73* 77* 76*  CREATININE 4.72* 4.76* 4.81* 4.82* 4.89*  CALCIUM 8.4 8.6 8.7 8.7 8.7  PHOS  --   --  6.3* 6.9* 7.2*   Liver Function Tests:  Recent Labs Lab 01/01/14 1339 01/02/14 0316 01/06/14 0311 01/07/14 0243 01/08/14 0228  AST 34 34  --   --   --   ALT 12 12  --   --   --   ALKPHOS 42 40  --   --   --   BILITOT 0.4 0.2*  --   --   --   PROT  6.3 5.8*  --   --   --   ALBUMIN 2.6* 2.2* 2.0* 2.1* 2.1*   No results found for this basename: LIPASE, AMYLASE,  in the last 168 hours No results found for this basename: AMMONIA,  in the last 168 hours CBC:  Recent Labs Lab 01/01/14 1339  01/02/14 0316 01/03/14 0235 01/04/14 0217 01/05/14 0245 01/07/14 0243  WBC 3.0*  < > 4.0 5.9 8.9 7.9 8.9  NEUTROABS 2.4  --   --   --   --   --  6.4  HGB 10.3*  < > 9.3* 8.9* 8.3* 8.1* 9.1*  HCT 32.0*  < > 29.5* 27.8* 25.7* 24.9* 28.2*  MCV 97.0  < > 94.6 94.9 93.1 92.2 91.3  PLT 145*  < > 142* 157 171 188 277  < > = values in this  interval not displayed. Cardiac Enzymes: No results found for this basename: CKTOTAL, CKMB, CKMBINDEX, TROPONINI,  in the last 168 hours BNP (last 3 results)  Recent Labs  07/28/13 1650  PROBNP 32203.0*   CBG:  Recent Labs Lab 01/07/14 0807 01/07/14 1212 01/07/14 1644 01/07/14 2107 01/08/14 0748  GLUCAP 102* 92 82 109* 86    Recent Results (from the past 240 hour(s))  URINE CULTURE     Status: None   Collection Time    01/01/14 12:34 PM      Result Value Ref Range Status   Specimen Description URINE, CLEAN CATCH   Final   Special Requests NONE   Final   Culture  Setup Time     Final   Value: 01/01/2014 13:10     Performed at [facility]   Colony Count     Final   Value: >=100,000 COLONIES/ML     Performed at [facility]   Culture     Final   Value: STAPHYLOCOCCUS AUREUS     Note: RIFAMPIN AND GENTAMICIN SHOULD NOT BE USED AS SINGLE DRUGS FOR TREATMENT OF STAPH INFECTIONS.     Performed at [facility]   Report Status 01/03/2014 FINAL   Final   Organism ID, Bacteria STAPHYLOCOCCUS AUREUS   Final  CULTURE, BLOOD (ROUTINE X 2)     Status: None   Collection Time    01/01/14  1:50 PM      Result Value Ref Range Status   Specimen Description BLOOD ARM RIGHT   Final   Special Requests     Final   Value: BOTTLES DRAWN AEROBIC AND ANAEROBIC BLUE 10CC RED 5CC   Culture   Setup Time     Final   Value: 01/01/2014 22:00     Performed at [facility]   Culture     Final   Value: GROUP A STREP (S.PYOGENES) ISOLATED     Note: FAXED TO [facility] ATTN CYNTHIA GRANTHAM [phone] BY PEAKY     Note: Gram Stain Report Called to,Read Back By and Verified With: [facility] 01/02/14 @ 12:27PM BY RUSCOE A. CRITICAL RESULT CALLED TO, READ BACK BY AND VERIFIED WITH: STUART WATERS 01/03/14 @ 9:25AM BY RUSCOE A.     Performed at [facility]   Report Status 01/04/2014 FINAL   Final  CULTURE, BLOOD (ROUTINE X 2)     Status: None   Collection Time    01/01/14  2:00 PM      Result Value Ref Range Status   Specimen Description BLOOD HAND RIGHT   Final   Special Requests BOTTLES DRAWN AEROBIC ONLY 10CC   Final   Culture  Setup Time     Final   Value: 01/01/2014 22:01     Performed at [facility]   Culture     Final   Value: GROUP A STREP (S.PYOGENES) ISOLATED     Note: FAXED TO [facility] ATTN CYNTHIA GRANTHAM [phone] BY PEAKY     Note: Gram Stain Report Called to,Read Back By and Verified With: [facility] 01/02/14 @ 12:27PM BY RUSCOE A. CRITICAL RESULT CALLED TO, READ BACK BY AND VERIFIED WITH: STUART WATERS 01/03/14 @ 9:25AM BY RUSCOE A.     Performed at [facility]   Report Status 01/04/2014 FINAL   Final  MRSA PCR SCREENING     Status: None   Collection Time    01/01/14  7:00 PM  Result Value Ref Range Status   MRSA by PCR NEGATIVE  NEGATIVE Final   Comment:            The GeneXpert MRSA Assay (FDA     approved for NASAL specimens     only), is one component of a     comprehensive MRSA colonization     surveillance program. It is not     intended to diagnose MRSA     infection nor to guide or     monitor treatment for     MRSA infections.  CULTURE, BLOOD (ROUTINE X 2)     Status: None   Collection Time    01/01/14  8:16 PM      Result Value Ref Range Status   Specimen Description BLOOD RIGHT HAND   Final   Special  Requests BOTTLES DRAWN AEROBIC ONLY 2CC   Final   Culture  Setup Time     Final   Value: 01/02/2014 00:46     Performed at [facility]   Culture     Final   Value: NO GROWTH 5 DAYS     Performed at [facility]   Report Status 01/08/2014 FINAL   Final  CULTURE, BLOOD (ROUTINE X 2)     Status: None   Collection Time    01/01/14  8:23 PM      Result Value Ref Range Status   Specimen Description BLOOD RIGHT ARM   Final   Special Requests     Final   Value: BOTTLES DRAWN AEROBIC AND ANAEROBIC 10CC AER,6CC ANA   Culture  Setup Time     Final   Value: 01/02/2014 00:46     Performed at [facility]   Culture     Final   Value: NO GROWTH 5 DAYS     Performed at [facility]   Report Status 01/08/2014 FINAL   Final  CULTURE, BLOOD (ROUTINE X 2)     Status: None   Collection Time    01/02/14  2:25 PM      Result Value Ref Range Status   Specimen Description BLOOD RIGHT HAND   Final   Special Requests BOTTLES DRAWN AEROBIC ONLY 5CC   Final   Culture  Setup Time     Final   Value: 01/02/2014 22:28     Performed at [facility]   Culture     Final   Value: NO GROWTH 5 DAYS     Performed at [facility]   Report Status 01/08/2014 FINAL   Final  CULTURE, BLOOD (ROUTINE X 2)     Status: None   Collection Time    01/02/14  2:40 PM      Result Value Ref Range Status   Specimen Description BLOOD RIGHT ARM   Final   Special Requests BOTTLES DRAWN AEROBIC AND ANAEROBIC 10CC EACH   Final   Culture  Setup Time     Final   Value: 01/02/2014 22:28     Performed at [facility]   Culture     Final   Value: NO GROWTH 5 DAYS     Performed at [facility]   Report Status 01/08/2014 FINAL   Final     Studies: No results found.  Scheduled Meds: . amLODipine  10 mg Oral Daily  . antiseptic oral rinse  15 mL Mouth Rinse q12n4p  . antiseptic oral rinse  15 mL Mouth Rinse BID  .  aspirin EC  81 mg Oral Daily  .  ceFAZolin (ANCEF)  IV  1 g Intravenous Q24H  . chlorhexidine  15 mL Mouth Rinse BID  . clindamycin (CLEOCIN) IV  600 mg Intravenous 3 times per day  . darbepoetin (ARANESP) injection - NON-DIALYSIS  100 mcg Subcutaneous Q Tue-1800  . docusate sodium  100 mg Oral BID  . feeding supplement (ENSURE COMPLETE)  237 mL Oral BID BM  . furosemide  80 mg Intravenous Q12H  . heparin  5,000 Units Subcutaneous 3 times per day  . hydrALAZINE  150 mg Oral TID  . insulin aspart  0-15 Units Subcutaneous TID WC  . insulin aspart  0-5 Units Subcutaneous QHS  . polyethylene glycol  17 g Oral Daily  . silver sulfADIAZINE   Topical Daily  . sodium bicarbonate  650 mg Oral BID  . sodium chloride  3 mL Intravenous Q12H   Continuous Infusions: . sodium chloride 10 mL/hr at 01/07/14 2205    Principal Problem:   Cellulitis Active Problems:   Hypertensive heart disease   Tobacco dependency   Chronic diastolic CHF (congestive heart failure)   Sepsis   Acute on chronic renal failure   Diabetes mellitus with peripheral vascular disease    Time spent: 25 min  Pleas Koch, MD [facility] (P) [phone]

## 2014-01-09 LAB — CBC WITH DIFFERENTIAL/PLATELET
Basophils Absolute: 0 10*3/uL (ref 0.0–0.1)
Basophils Relative: 0 % (ref 0–1)
EOS PCT: 1 % (ref 0–5)
Eosinophils Absolute: 0.1 10*3/uL (ref 0.0–0.7)
HCT: 27.6 % — ABNORMAL LOW (ref 36.0–46.0)
Hemoglobin: 8.9 g/dL — ABNORMAL LOW (ref 12.0–15.0)
Lymphocytes Relative: 12 % (ref 12–46)
Lymphs Abs: 1.5 10*3/uL (ref 0.7–4.0)
MCH: 29.5 pg (ref 26.0–34.0)
MCHC: 32.2 g/dL (ref 30.0–36.0)
MCV: 91.4 fL (ref 78.0–100.0)
MONO ABS: 1.1 10*3/uL — AB (ref 0.1–1.0)
Monocytes Relative: 9 % (ref 3–12)
NEUTROS PCT: 78 % — AB (ref 43–77)
Neutro Abs: 9.8 10*3/uL — ABNORMAL HIGH (ref 1.7–7.7)
Platelets: 362 10*3/uL (ref 150–400)
RBC: 3.02 MIL/uL — AB (ref 3.87–5.11)
RDW: 14 % (ref 11.5–15.5)
WBC: 12.5 10*3/uL — ABNORMAL HIGH (ref 4.0–10.5)

## 2014-01-09 LAB — RENAL FUNCTION PANEL
Albumin: 2.1 g/dL — ABNORMAL LOW (ref 3.5–5.2)
Albumin: 2.1 g/dL — ABNORMAL LOW (ref 3.5–5.2)
BUN: 72 mg/dL — ABNORMAL HIGH (ref 6–23)
BUN: 71 mg/dL — AB (ref 6–23)
CALCIUM: 8.4 mg/dL (ref 8.4–10.5)
CHLORIDE: 100 meq/L (ref 96–112)
CO2: 22 meq/L (ref 19–32)
CO2: 23 meq/L (ref 19–32)
CREATININE: 4.84 mg/dL — AB (ref 0.50–1.10)
Calcium: 8.3 mg/dL — ABNORMAL LOW (ref 8.4–10.5)
Chloride: 100 mEq/L (ref 96–112)
Creatinine, Ser: 4.77 mg/dL — ABNORMAL HIGH (ref 0.50–1.10)
GFR calc Af Amer: 9 mL/min — ABNORMAL LOW (ref >90)
GFR calc non Af Amer: 8 mL/min — ABNORMAL LOW (ref >90)
GFR, EST AFRICAN AMERICAN: 9 mL/min — AB (ref >90)
GFR, EST NON AFRICAN AMERICAN: 8 mL/min — AB (ref >90)
GLUCOSE: 76 mg/dL (ref 70–99)
Glucose, Bld: 77 mg/dL (ref 70–99)
Phosphorus: 7.3 mg/dL — ABNORMAL HIGH (ref 2.3–4.6)
Phosphorus: 7.3 mg/dL — ABNORMAL HIGH (ref 2.3–4.6)
Potassium: 3.8 mEq/L (ref 3.7–5.3)
Potassium: 3.9 mEq/L (ref 3.7–5.3)
SODIUM: 142 meq/L (ref 137–147)
Sodium: 143 mEq/L (ref 137–147)

## 2014-01-09 LAB — GLUCOSE, CAPILLARY
GLUCOSE-CAPILLARY: 98 mg/dL (ref 70–99)
Glucose-Capillary: 81 mg/dL (ref 70–99)
Glucose-Capillary: 98 mg/dL (ref 70–99)
Glucose-Capillary: 74 mg/dL (ref 70–99)

## 2014-01-09 MED ORDER — CARVEDILOL 12.5 MG PO TABS
12.5000 mg | ORAL_TABLET | Freq: Two times a day (BID) | ORAL | Status: DC
  Administered 2014-01-09 – 2014-01-10 (×2): 12.5 mg via ORAL
  Filled 2014-01-09 (×4): qty 1

## 2014-01-09 MED ORDER — OXYCODONE-ACETAMINOPHEN 5-325 MG PO TABS
1.0000 | ORAL_TABLET | ORAL | Status: DC | PRN
  Administered 2014-01-09 – 2014-01-10 (×4): 2 via ORAL
  Administered 2014-01-11 – 2014-01-14 (×3): 1 via ORAL
  Filled 2014-01-09 (×3): qty 2
  Filled 2014-01-09: qty 1
  Filled 2014-01-09: qty 2
  Filled 2014-01-09: qty 1

## 2014-01-09 NOTE — Progress Notes (Signed)
14 Fr foley catheter inserted with Liezl Pore, NT. Pt tolerated well, urine returned noted.

## 2014-01-09 NOTE — Progress Notes (Signed)
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  She presented with complaints of left upper extremity pain, swelling, erythema, having an associated functional decline, generalized weakness, feeling ill. Patient found to have severe left upper extremity cellulitis, with initial lab work showing white count of 3000 with presence of bands, temperature 101.1, development of acute on chronic renal failure. Given severity of presentation a CT scan of her left upper extremity was obtained in the emergency room which revealed severe diffuse edematous changes consistent with severe cellulitis. She was initially started on broad-spectrum IV antibiotic therapy with vancomycin and Zosyn. General surgery was consulted.  She showed little improvement over the following days, having further deterioration to kidney function as well as becoming more encephalopathic. Blood cultures obtained on 01/01/2014, back positive for group A streptococcus in both sets. Repeat blood cultures were obtained. General surgery recommended orthopedic surgery consultation.  On 01/04/2014 patient was seen and evaluated by Dr. Luiz Blare of orthopedic surgery. He recommended ongoing IV antimicrobial therapy and did not feel surgical intervention was warranted at this time. Dr. Drue Second of infectious disease was consulted and recommended the discontinuation of vancomycin and Zosyn and starting Clindamycin and Ancef. With patient's deterioration to kidney function Dr. Kathrene Bongo of nephrology was consulted as well. Though initially we had planned on re-imaging her arm, speaking with Dr. Luiz Blare, he did  not recommend pursuing MRI.  Plastic surgery consult placed 6/25 and kept NPO for potenttial surgery                                                                             Assessment/Plan: 1. Sepsis -Present on admission, evidence by leukopenia  3000, presence of bands, temperature of 101.1, respiratory rate of 27, having positive blood cultures--Microbiology reporting that blood cultures positive for Group A Strep x 2 sets  Repeat blood cultures drawn on 01/02/2014 showing no growth.   Note Urine also + for >100,000 Staph -Source of infection likely to be left upper extremity cellulitis.  -ID consulted recommended  to Clindamycin and Ancef on 6/22 -WOC nurse asked to assess area -Plastic surgery Dr. Kelly Splinter consulted-possible debridement on Monday 6/29  2.  Left upper extremity cellulitis -CT scan of left upper extremity showing severe diffuse changes consistent with cellulitis.  -Will continue IV AB coverage, now on Clindamycin and Ancef -Wound care, General Surgery, plastic surgery and Orthopedics consulted -Blood cultures growing Group A Strep -prealbumin 8.4 indicating poor protein reserves  3.  Acute on Chronic Renal Failure -Patient with history of stage IV CKD, baseline creatinine near 3.4.  -AM lab work showing creatinine of 4/76 -likely worsened by IV contrast 01/01/14 -Placed on bicard gtt, now BMP showing bicarb of 19. Will stop bicarb and switch to NS -Dr Kathrene Bongo of Nephrology input appreciated  -unlcear if would want dialysis  4. Chronic diastolic CHF -Compensated.  -Diuretics stopped on admission.  -Follow volume status closely as she is getting IV fluids  5.  Metabolic Acidosis -Bicab completely resolved  6. Staphylococcal pyelonephritis -On broad spectrum AB therapy  7. Type 2 diabetes Mellitus -Continue accuchecks ac and hs -sugars in 70-80's  8. Toxic metabolic encephalopathy probably 2/2 to uremia/sepsis -much improved compared to  yesterday -monitor for delirium  Code Status: Full Family Communication: called and  Disposition Plan: Continue IV fluids, IV AB's   Consultants:  Surgery   Antibiotics:  Vanc  Zosyn  HPI/Subjective:  Alert. Much more oriented. eating well     Objective: Filed Vitals:   01/09/14 0436  BP: 182/62  Pulse: 80  Temp: 98.7 F (37.1 C)  Resp: 16    Intake/Output Summary (Last 24 hours) at 01/09/14 1048 Last data filed at 01/09/14 0955  Gross per 24 hour  Intake   1100 ml  Output   1650 ml  Net   -550 ml   Filed Weights   01/07/14 0333 01/08/14 0800 01/09/14 0436  Weight: 76 kg (167 lb 8.8 oz) 73.4 kg (161 lb 13.1 oz) 72.6 kg (160 lb 0.9 oz)    Exam:   General:  Patient is lethargic, confused, can not follow commands  Cardiovascular: Regular rate and rhythm, normal S1S2  Respiratory: Clear to auscultation, normal inspiratory effort  Abdomen: Soft nontender nondistended   Data Reviewed: Basic Metabolic Panel:  Recent Labs Lab 01/05/14 0245 01/06/14 0311 01/07/14 0243 01/08/14 0228 01/09/14 0455  NA 137 139 141 141 143  142  K 4.4 4.8 4.4 4.3 3.9  3.8  CL 100 101 101 101 100  100  CO2 19 16* 16* 19 23  22   GLUCOSE 104* 104* 92 83 77  76  BUN 76* 73* 77* 76* 71*  72*  CREATININE 4.76* 4.81* 4.82* 4.89* 4.84*  4.77*  CALCIUM 8.6 8.7 8.7 8.7 8.3*  8.4  PHOS  --  6.3* 6.9* 7.2* 7.3*  7.3*   Liver Function Tests:  Recent Labs Lab 01/06/14 0311 01/07/14 0243 01/08/14 0228 01/09/14 0455  ALBUMIN 2.0* 2.1* 2.1* 2.1*  2.1*   No results found for this basename: LIPASE, AMYLASE,  in the last 168 hours No results found for this basename: AMMONIA,  in the last 168 hours CBC:  Recent Labs Lab 01/03/14 0235 01/04/14 0217 01/05/14 0245 01/07/14 0243 01/09/14 0455  WBC 5.9 8.9 7.9 8.9 12.5*  NEUTROABS  --   --   --  6.4 9.8*  HGB 8.9* 8.3* 8.1* 9.1* 8.9*  HCT 27.8* 25.7* 24.9* 28.2* 27.6*  MCV 94.9 93.1 92.2 91.3 91.4  PLT 157  171 188 277 362   Cardiac Enzymes: No results found for this basename: CKTOTAL, CKMB, CKMBINDEX, TROPONINI,  in the last 168 hours BNP (last 3 results)  Recent Labs  07/28/13 1650  PROBNP 32203.0*   CBG:  Recent Labs Lab 01/07/14 2107 01/08/14 0748 01/08/14 1141 01/08/14 1639 01/09/14 0750  GLUCAP 109* 86 83 94 81    Recent Results (from the past 240 hour(s))  URINE CULTURE     Status: None   Collection Time    01/01/14 12:34 PM      Result Value Ref Range Status   Specimen Description URINE, CLEAN CATCH   Final   Special Requests NONE   Final   Culture  Setup Time     Final   Value: 01/01/2014 13:10     Performed at [facility]   Colony Count     Final   Value: >=100,000 COLONIES/ML     Performed  at [facility]     Final   Value: STAPHYLOCOCCUS AUREUS     Note: RIFAMPIN AND GENTAMICIN SHOULD NOT BE USED AS SINGLE DRUGS FOR TREATMENT OF STAPH INFECTIONS.     Performed at [facility]   Report Status 01/03/2014 FINAL   Final   Organism ID, Bacteria STAPHYLOCOCCUS AUREUS   Final  CULTURE, BLOOD (ROUTINE X 2)     Status: None   Collection Time    01/01/14  1:50 PM      Result Value Ref Range Status   Specimen Description BLOOD ARM RIGHT   Final   Special Requests     Final   Value: BOTTLES DRAWN AEROBIC AND ANAEROBIC BLUE 10CC RED 5CC   Culture  Setup Time     Final   Value: 01/01/2014 22:00     Performed at [facility]   Culture     Final   Value: GROUP A STREP (S.PYOGENES) ISOLATED     Note: FAXED TO [place] [facility] ATTN CYNTHIA GRANTHAM [id number] BY PEAKY     Note: Gram Stain Report Called to,Read Back By and Verified With: [facility] LAFFARD 01/02/14 @ 12:27PM BY RUSCOE A. CRITICAL RESULT CALLED TO, READ BACK BY AND VERIFIED WITH: STUART WATERS 01/03/14 @ 9:25AM BY RUSCOE A.     Performed at [facility]   Report Status 01/04/2014 FINAL   Final  CULTURE, BLOOD (ROUTINE X 2)     Status: None   Collection Time     01/01/14  2:00 PM      Result Value Ref Range Status   Specimen Description BLOOD HAND RIGHT   Final   Special Requests BOTTLES DRAWN AEROBIC ONLY 10CC   Final   Culture  Setup Time     Final   Value: 01/01/2014 22:01     Performed at [facility]   Culture     Final   Value: GROUP A STREP (S.PYOGENES) ISOLATED     Note: FAXED TO [place] [facility] ATTN CYNTHIA GRANTHAM [id number] BY PEAKY     Note: Gram Stain Report Called to,Read Back By and Verified With: [facility] LAFFARD 01/02/14 @ 12:27PM BY RUSCOE A. CRITICAL RESULT CALLED TO, READ BACK BY AND VERIFIED WITH: STUART WATERS 01/03/14 @ 9:25AM BY RUSCOE A.     Performed at [facility]   Report Status 01/04/2014 FINAL   Final  MRSA PCR SCREENING     Status: None   Collection Time    01/01/14  7:00 PM      Result Value Ref Range Status   MRSA by PCR NEGATIVE  NEGATIVE Final   Comment:            The GeneXpert MRSA Assay (FDA     approved for NASAL specimens     only), is one component of a     comprehensive MRSA colonization     surveillance program. It is not     intended to diagnose MRSA     infection nor to guide or     monitor treatment for     MRSA infections.  CULTURE, BLOOD (ROUTINE X 2)     Status: None   Collection Time    01/01/14  8:16 PM      Result Value Ref Range Status   Specimen Description BLOOD RIGHT HAND   Final   Special Requests BOTTLES DRAWN AEROBIC ONLY 2CC   Final   Culture  Setup  Time     Final   Value: 01/02/2014 00:46     Performed at [facility]   Culture     Final   Value: NO GROWTH 5 DAYS     Performed at [facility]   Report Status 01/08/2014 FINAL   Final  CULTURE, BLOOD (ROUTINE X 2)     Status: None   Collection Time    01/01/14  8:23 PM      Result Value Ref Range Status   Specimen Description BLOOD RIGHT ARM   Final   Special Requests     Final   Value: BOTTLES DRAWN AEROBIC AND ANAEROBIC 10CC AER,6CC ANA   Culture  Setup Time     Final   Value: 01/02/2014  00:46     Performed at [facility]   Culture     Final   Value: NO GROWTH 5 DAYS     Performed at [facility]   Report Status 01/08/2014 FINAL   Final  CULTURE, BLOOD (ROUTINE X 2)     Status: None   Collection Time    01/02/14  2:25 PM      Result Value Ref Range Status   Specimen Description BLOOD RIGHT HAND   Final   Special Requests BOTTLES DRAWN AEROBIC ONLY 5CC   Final   Culture  Setup Time     Final   Value: 01/02/2014 22:28     Performed at [facility]   Culture     Final   Value: NO GROWTH 5 DAYS     Performed at [facility]   Report Status 01/08/2014 FINAL   Final  CULTURE, BLOOD (ROUTINE X 2)     Status: None   Collection Time    01/02/14  2:40 PM      Result Value Ref Range Status   Specimen Description BLOOD RIGHT ARM   Final   Special Requests BOTTLES DRAWN AEROBIC AND ANAEROBIC 10CC EACH   Final   Culture  Setup Time     Final   Value: 01/02/2014 22:28     Performed at [facility]   Culture     Final   Value: NO GROWTH 5 DAYS     Performed at [facility]   Report Status 01/08/2014 FINAL   Final     Studies: No results found.  Scheduled Meds: . amLODipine  10 mg Oral Daily  . aspirin EC  81 mg Oral Daily  .  ceFAZolin (ANCEF) IV  1 g Intravenous Q24H  . clindamycin (CLEOCIN) IV  600 mg Intravenous 3 times per day  . darbepoetin (ARANESP) injection - NON-DIALYSIS  100 mcg Subcutaneous Q Tue-1800  . docusate sodium  100 mg Oral BID  . feeding supplement (ENSURE COMPLETE)  237 mL Oral BID BM  . furosemide  80 mg Intravenous Q12H  . heparin  5,000 Units Subcutaneous 3 times per day  . hydrALAZINE  150 mg Oral TID  . insulin aspart  0-15 Units Subcutaneous TID WC  . insulin aspart  0-5 Units Subcutaneous QHS  . polyethylene glycol  17 g Oral Daily  . silver sulfADIAZINE   Topical Daily  . sodium bicarbonate  650 mg Oral BID  . sodium chloride  3 mL Intravenous Q12H   Continuous Infusions: . sodium  chloride 10 mL/hr at 01/08/14 2031    Principal Problem:   Cellulitis Active Problems:   Hypertensive heart disease   Tobacco  dependency   Chronic diastolic CHF (congestive heart failure)   Sepsis   Acute on chronic renal failure   Diabetes mellitus with peripheral vascular disease    Time spent: 25 min  Pleas KochJai Maizee Reinhold, MD [facility] [phone](P) [phone]

## 2014-01-09 NOTE — Progress Notes (Signed)
Subjective:  Events noted- blood pressure has continues to be high- UOP good on lasix- renal function stable- arm seems to be improving- mental status is much better- now has diarrhea  Objective Vital signs in last 24 hours: Filed Vitals:   01/08/14 1600 01/08/14 1858 01/08/14 2015 01/09/14 0436  BP: 192/38 190/63 171/62 182/62  Pulse:  68 73 80  Temp: 97.9 F (36.6 C) 97.9 F (36.6 C) 98.1 F (36.7 C) 98.7 F (37.1 C)  TempSrc: Oral Oral Oral Oral  Resp:  14 16 16   Height:      Weight:    72.6 kg (160 lb 0.9 oz)  SpO2: 93% 92% 96% 91%   Weight change:   Intake/Output Summary (Last 24 hours) at 01/09/14 1124 Last data filed at 01/09/14 0955  Gross per 24 hour  Intake   1100 ml  Output   1650 ml  Net   -550 ml    Assessment/Plan: 80 year old white female with advanced CKD at baseline. She now has acute on chronic renal failure in the setting of a significant upper extremity cellulitis/group A strep bacteremia and multiple antibiotics as well as a UTI  1.Renal- advanced CKD- stage 4 at baseline. Now with acute on chronic kidney disease in the setting of cellulitis/bacteremia/UTI/antibiotics. I do not see any direct clear-cut insult. Her decreased kidney function is likely based on this whole picture of infection/bacteremia/antibiotics/UTI. She is on an appropriate antibiotics for her urinary tract infection/bacteremia as ID is now involved. There is no obvious need for renal replacement therapy at this time.    I was hoping function was plateauing- has stayed the same for the last few days.  Son tells me that she was considering not pursuing dialysis at all but no decision made.  I did tell patient and family that dialysis was a possibility this hospitalization if things get worse.  Continue lasix for now 2. Hypertension/volume - hypertensive. Is on scheduled hydralazine and Norvasc and Inderal- PRN ?- will change this to scheduled coreg. Has been very positive fluid wise, diuresing on  lasix.  Will continue with lasix to assist with blood pressure control- also have added PRN clonidine 3. ID- severe cellulitis of her left arm as well as urinary tract infection and bacteremia. Infectious disease is now involved. She is now on Ancef as well as clindamycin. Surgical input has been obtained as well. Her arm clinically seems better- for OR on Monday 4. Anemia - is likely multifactorial. Iron stores low but hesitant to give IV iron given bacteremia, have started Aranesp- is improved 5. MS-  has improved 6. Acidosis-  PO bicarb 7. Constipation- maybe due to narcotics; now diarrhea    GOLDSBOROUGH,KELLIE A    Labs: Basic Metabolic Panel:  Recent Labs Lab 01/07/14 0243 01/08/14 0228 01/09/14 0455  NA 141 141 143  142  K 4.4 4.3 3.9  3.8  CL 101 101 100  100  CO2 16* 19 23  22   GLUCOSE 92 83 77  76  BUN 77* 76* 71*  72*  CREATININE 4.82* 4.89* 4.84*  4.77*  CALCIUM 8.7 8.7 8.3*  8.4  PHOS 6.9* 7.2* 7.3*  7.3*   Liver Function Tests:  Recent Labs Lab 01/07/14 0243 01/08/14 0228 01/09/14 0455  ALBUMIN 2.1* 2.1* 2.1*  2.1*   No results found for this basename: LIPASE, AMYLASE,  in the last 168 hours No results found for this basename: AMMONIA,  in the last 168 hours CBC:  Recent Labs Lab 01/03/14  [id number] 01/04/14 0217 01/05/14 0245 01/07/14 0243 01/09/14 0455  WBC 5.9 8.9 7.9 8.9 12.5*  NEUTROABS  --   --   --  6.4 9.8*  HGB 8.9* 8.3* 8.1* 9.1* 8.9*  HCT 27.8* 25.7* 24.9* 28.2* 27.6*  MCV 94.9 93.1 92.2 91.3 91.4  PLT 157 171 188 277 362   Cardiac Enzymes: No results found for this basename: CKTOTAL, CKMB, CKMBINDEX, TROPONINI,  in the last 168 hours CBG:  Recent Labs Lab 01/07/14 2107 01/08/14 0748 01/08/14 1141 01/08/14 1639 01/09/14 0750  GLUCAP 109* 86 83 94 81    Iron Studies:  No results found for this basename: IRON, TIBC, TRANSFERRIN, FERRITIN,  in the last 72 hours Studies/Results: No results  found. Medications: Infusions: . sodium chloride 10 mL/hr at 01/08/14 2031    Scheduled Medications: . amLODipine  10 mg Oral Daily  . aspirin EC  81 mg Oral Daily  .  ceFAZolin (ANCEF) IV  1 g Intravenous Q24H  . clindamycin (CLEOCIN) IV  600 mg Intravenous 3 times per day  . darbepoetin (ARANESP) injection - NON-DIALYSIS  100 mcg Subcutaneous Q Tue-1800  . docusate sodium  100 mg Oral BID  . feeding supplement (ENSURE COMPLETE)  237 mL Oral BID BM  . furosemide  80 mg Intravenous Q12H  . heparin  5,000 Units Subcutaneous 3 times per day  . hydrALAZINE  150 mg Oral TID  . insulin aspart  0-15 Units Subcutaneous TID WC  . insulin aspart  0-5 Units Subcutaneous QHS  . polyethylene glycol  17 g Oral Daily  . silver sulfADIAZINE   Topical Daily  . sodium bicarbonate  650 mg Oral BID  . sodium chloride  3 mL Intravenous Q12H    have reviewed scheduled and prn medications.  Physical Exam: General: more alert Heart: RRR Lungs: mostly clear-  Abdomen: obese, soft, non tender Extremities: mild edema- left arm bandaged out of elevator- actually seems less red to me    01/09/2014,11:24 AM  LOS: 8 days

## 2014-01-09 NOTE — Progress Notes (Signed)
Patient 16 Fr foley catheter leaking. Claiborne Billingsallahan, NP gave order to insert 14 Fr foley catheter.

## 2014-01-09 NOTE — Progress Notes (Signed)
Pt BP elevated at 182/62. Clonidine 0.1mg  prn given. Will continue to monitor.

## 2014-01-10 LAB — RENAL FUNCTION PANEL
ALBUMIN: 2.3 g/dL — AB (ref 3.5–5.2)
BUN: 66 mg/dL — AB (ref 6–23)
CO2: 25 mEq/L (ref 19–32)
CREATININE: 4.61 mg/dL — AB (ref 0.50–1.10)
Calcium: 8.6 mg/dL (ref 8.4–10.5)
Chloride: 95 mEq/L — ABNORMAL LOW (ref 96–112)
GFR calc Af Amer: 10 mL/min — ABNORMAL LOW (ref >90)
GFR calc non Af Amer: 8 mL/min — ABNORMAL LOW (ref >90)
Glucose, Bld: 84 mg/dL (ref 70–99)
PHOSPHORUS: 7.7 mg/dL — AB (ref 2.3–4.6)
Potassium: 3.7 mEq/L (ref 3.7–5.3)
Sodium: 140 mEq/L (ref 137–147)

## 2014-01-10 LAB — GLUCOSE, CAPILLARY
GLUCOSE-CAPILLARY: 97 mg/dL (ref 70–99)
GLUCOSE-CAPILLARY: 122 mg/dL — AB (ref 70–99)
GLUCOSE-CAPILLARY: 131 mg/dL — AB (ref 70–99)
Glucose-Capillary: 109 mg/dL — ABNORMAL HIGH (ref 70–99)

## 2014-01-10 MED ORDER — PROMETHAZINE HCL 25 MG/ML IJ SOLN
12.5000 mg | Freq: Once | INTRAMUSCULAR | Status: DC
  Filled 2014-01-10: qty 1

## 2014-01-10 MED ORDER — SILVER SULFADIAZINE 1 % EX CREA
TOPICAL_CREAM | Freq: Two times a day (BID) | CUTANEOUS | Status: DC
  Administered 2014-01-10 – 2014-01-13 (×3): via TOPICAL
  Filled 2014-01-10: qty 85

## 2014-01-10 MED ORDER — NALOXONE HCL 0.4 MG/ML IJ SOLN
0.4000 mg | Freq: Once | INTRAMUSCULAR | Status: AC
  Administered 2014-01-11: 0.4 mg via INTRAVENOUS

## 2014-01-10 MED ORDER — CARVEDILOL 25 MG PO TABS
25.0000 mg | ORAL_TABLET | Freq: Two times a day (BID) | ORAL | Status: DC
  Administered 2014-01-10 – 2014-01-19 (×17): 25 mg via ORAL
  Filled 2014-01-10 (×23): qty 1

## 2014-01-10 MED ORDER — ENSURE PUDDING PO PUDG: 1.0000 | Freq: Three times a day (TID) | ORAL | Status: DC

## 2014-01-10 MED ORDER — NALOXONE HCL 0.4 MG/ML IJ SOLN
INTRAMUSCULAR | Status: AC
  Filled 2014-01-10: qty 1

## 2014-01-10 MED ORDER — MORPHINE SULFATE 2 MG/ML IJ SOLN
2.0000 mg | INTRAMUSCULAR | Status: DC | PRN
  Administered 2014-01-10: 2 mg via INTRAVENOUS
  Filled 2014-01-10: qty 1

## 2014-01-10 NOTE — Progress Notes (Signed)
Paged Rapid Response nurse to lay eyes on patient. Pt still lethargic but provides verbal response to commands and questions. Pt is alert and oriented. 02 sats 97-100% on 3L 02 [place]. Rapid response nurse stated that she would be up to see patient.

## 2014-01-10 NOTE — Progress Notes (Signed)
Subjective:  Events noted- blood pressure has continues to be high- UOP good on lasix- renal function stable to improved this AM- arm seems to be improving- mental status is much better- still has diarrhea  Objective Vital signs in last 24 hours: Filed Vitals:   01/09/14 2059 01/10/14 0633 01/10/14 0806 01/10/14 0950  BP: 181/57 185/68 193/65 156/61  Pulse: 70 72 77 73  Temp: 98.4 F (36.9 C) 97.7 F (36.5 C)    TempSrc: Oral Oral    Resp: 16 16    Height:      Weight:      SpO2: 99% 95%     Weight change:   Intake/Output Summary (Last 24 hours) at 01/10/14 1103 Last data filed at 01/10/14 0947  Gross per 24 hour  Intake 3638.67 ml  Output   1700 ml  Net 1938.67 ml    Assessment/Plan: 80 year old white female with advanced CKD at baseline. She now has acute on chronic renal failure in the setting of [name] significant upper extremity cellulitis/group [name] strep bacteremia and multiple antibiotics as well as [name] UTI  1.Renal- advanced CKD- stage 4 at baseline. Now with acute on chronic kidney disease in the setting of cellulitis/bacteremia/UTI/antibiotics. I do not see any direct clear-cut insult. Her decreased kidney function is likely based on this whole picture of infection/bacteremia/antibiotics/UTI. She is on an appropriate antibiotics for her urinary tract infection/bacteremia as ID is now involved. There is no obvious need for renal replacement therapy at this time.    Seems kidney function has plateaued- now starting to improve.  Son tells me that she was considering not pursuing dialysis at all but no decision made.  I did tell patient and family that dialysis was [name] possibility this hospitalization if things get worse.  Continue lasix for now 2. Hypertension/volume - hypertensive. Is on scheduled hydralazine and Norvasc and  Now scheduled coreg. Has been very positive fluid wise, diuresing on lasix.  Will continue with lasix to assist with blood pressure control- also have added PRN  clonidine 3. ID- severe cellulitis of her left arm as well as urinary tract infection and bacteremia. Infectious disease is now involved. She is now on Ancef as well as clindamycin. Surgical input has been obtained as well. Her arm clinically seems better- for OR on Monday 4. Anemia - is likely multifactorial. Iron stores low but hesitant to give IV iron given bacteremia, have started Aranesp- is improved 5. MS-  has improved 6. Acidosis-  PO bicarb 7. Constipation- maybe due to narcotics; now diarrhea    Tricia [name],Tricia [name]    Labs: Basic Metabolic Panel:  Recent Labs Lab 01/08/14 0228 01/09/14 0455 01/10/14 0555  NA 141 143  142 140  K 4.3 3.9  3.8 3.7  CL 101 100  100 95*  CO2 19 23  22 25   GLUCOSE 83 77  76 84  BUN 76* 71*  72* 66*  CREATININE 4.89* 4.84*  4.77* 4.61*  CALCIUM 8.7 8.3*  8.4 8.6  PHOS 7.2* 7.3*  7.3* 7.7*   Liver Function Tests:  Recent Labs Lab 01/08/14 0228 01/09/14 0455 01/10/14 0555  ALBUMIN 2.1* 2.1*  2.1* 2.3*   No results found for this basename: LIPASE, AMYLASE,  in the last 168 hours No results found for this basename: AMMONIA,  in the last 168 hours CBC:  Recent Labs Lab 01/04/14 0217 01/05/14 0245 01/07/14 0243 01/09/14 0455  WBC 8.9 7.9 8.9 12.5*  NEUTROABS  --   --  6.4  9.8*  HGB 8.3* 8.1* 9.1* 8.9*  HCT 25.7* 24.9* 28.2* 27.6*  MCV 93.1 92.2 91.3 91.4  PLT 171 188 277 362   Cardiac Enzymes: No results found for this basename: CKTOTAL, CKMB, CKMBINDEX, TROPONINI,  in the last 168 hours CBG:  Recent Labs Lab 01/09/14 0750 01/09/14 1147 01/09/14 1646 01/09/14 2156 01/10/14 0749  GLUCAP 81 98 98 74 97    Iron Studies:  No results found for this basename: IRON, TIBC, TRANSFERRIN, FERRITIN,  in the last 72 hours Studies/Results: No results found. Medications: Infusions: . sodium chloride 10 mL/hr at 01/08/14 2031    Scheduled Medications: . amLODipine  10 mg Oral Daily  . aspirin EC  81 mg Oral  Daily  . carvedilol  12.5 mg Oral BID WC  .  ceFAZolin (ANCEF) IV  1 g Intravenous Q24H  . clindamycin (CLEOCIN) IV  600 mg Intravenous 3 times per day  . darbepoetin (ARANESP) injection - NON-DIALYSIS  100 mcg Subcutaneous Q Tue-1800  . docusate sodium  100 mg Oral BID  . feeding supplement (ENSURE COMPLETE)  237 mL Oral BID BM  . furosemide  80 mg Intravenous Q12H  . heparin  5,000 Units Subcutaneous 3 times per day  . hydrALAZINE  150 mg Oral TID  . insulin aspart  0-15 Units Subcutaneous TID WC  . insulin aspart  0-5 Units Subcutaneous QHS  . silver sulfADIAZINE   Topical Daily  . sodium bicarbonate  650 mg Oral BID  . sodium chloride  3 mL Intravenous Q12H    have reviewed scheduled and prn medications.  Physical Exam: General: more alert Heart: RRR Lungs: mostly clear-  Abdomen: obese, soft, non tender Extremities: mild edema- left arm bandaged out of elevator- actually seems less red to me    01/10/2014,11:03 AM  LOS: 9 days

## 2014-01-10 NOTE — Progress Notes (Signed)
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  She presented with complaints of left upper extremity pain, swelling, erythema, having an associated functional decline, generalized weakness, feeling ill. Patient found to have severe left upper extremity cellulitis, with initial lab work showing white count of 3000 with presence of bands, temperature 101.1, development of acute on chronic renal failure. Given severity of presentation a CT scan of her left upper extremity was obtained in the emergency room which revealed severe diffuse edematous changes consistent with severe cellulitis. She was initially started on broad-spectrum IV antibiotic therapy with vancomycin and Zosyn. General surgery was consulted.  She showed little improvement over the following days, having further deterioration to kidney function as well as becoming more encephalopathic. Blood cultures obtained on 01/01/2014, back positive for group A streptococcus in both sets. Repeat blood cultures were obtained. General surgery recommended orthopedic surgery consultation.  On 01/04/2014 patient was seen and evaluated by Dr. Luiz Blare of orthopedic surgery. He recommended ongoing IV antimicrobial therapy and did not feel surgical intervention was warranted at this time. Dr. Drue Second of infectious disease was consulted and recommended the discontinuation of vancomycin and Zosyn and starting Clindamycin and Ancef. With patient's deterioration to kidney function Dr. Kathrene Bongo of nephrology was consulted as well. Though initially we had planned on re-imaging her arm, speaking with Dr. Luiz Blare, he did  not recommend pursuing MRI.  Plastic surgery consult placed 6/25 and kept NPO for potenttial surgery                                                                             Assessment/Plan: 1. Sepsis -Present on admission, evidence by leukopenia  3000, presence of bands, temperature of 101.1, respiratory rate of 27, having positive blood cultures--Microbiology reporting that blood cultures positive for Group A Strep x 2 sets  Repeat blood cultures drawn on 01/02/2014 showing no growth.   Note Urine also + for >100,000 Staph -Source of infection likely to be left upper extremity cellulitis.  -ID consulted recommended  to Clindamycin and Ancef on 6/22 -WOC nurse asked to assess area-changed dressings to silvadene -Plastic surgery Dr. Kelly Splinter consulted-possible debridement on Monday 6/29  2.  Left upper extremity cellulitis -CT scan of left upper extremity showing severe diffuse changes consistent with cellulitis.  -Will continue IV AB coverage, now on Clindamycin and Ancef -Wound care, General Surgery, plastic surgery and Orthopedics consulted -Blood cultures growing Group A Strep -prealbumin 8.4 indicating poor protein reserves-defer to surgery  3.  Acute on Chronic Renal Failure -Patient with history of stage IV CKD, baseline creatinine near 3.4.  -AM lab work showing creatinine of 4/76--Seems to e trending dow , is 4.6 today -likely worsened by IV contrast 01/01/14 -Placed on bicard gtt, now BMP showing bicarb of 19. Will stop bicarb and switch to NS -Dr Kathrene Bongo of Nephrology input appreciated  -unlcear if would want dialysis  4. Chronic diastolic CHF -Compensated.  -Diuretics stopped  on admission.  -Follow volume status -lasix started by nephrology on currently 80 mg iv bid -still net + ~8 liters  5.  Metabolic Acidosis -Bicab completely resolved  6. Staphylococcal pyelonephritis -On broad spectrum AB therapy  7. Type 2 diabetes Mellitus -Continue accuchecks ac and  hs -sugars in 70-80's  8. Toxic metabolic encephalopathy probably 2/2 to uremia/sepsis -much improved compared to yesterday -monitor for delirium  9. HTn -poor control -clonidine added per renal 6/28 0.1 mg for pressure above 170 contue hydralazine 150 tid, coreg 12.5 bid increased to 25 bid, amlodipine 10  Code Status: Full Family Communication: called and  Disposition Plan: Continue IV fluids, IV AB's   Consultants:  Surgery   Antibiotics:  Vanc  Zosyn  HPI/Subjective:  Alert. Much more oriented. Still has diarrhea Rates pain close to 10/10 and requesting IV morphine in addition to oral percocet No n/v No cp      Objective: Filed Vitals:   01/10/14 0950  BP: 156/61  Pulse: 73  Temp:   Resp:     Intake/Output Summary (Last 24 hours) at 01/10/14 1242 Last data filed at 01/10/14 0947  Gross per 24 hour  Intake 3638.67 ml  Output   1700 ml  Net 1938.67 ml   Filed Weights   01/07/14 0333 01/08/14 0800 01/09/14 0436  Weight: 76 kg (167 lb 8.8 oz) 73.4 kg (161 lb 13.1 oz) 72.6 kg (160 lb 0.9 oz)    Exam:   General:  Patient is lethargic, confused, can not follow commands  Cardiovascular: Regular rate and rhythm, normal S1S2  Respiratory: Clear to auscultation, normal inspiratory effort  Abdomen: Soft nontender nondistended   Data Reviewed: Basic Metabolic Panel:  Recent Labs Lab 01/06/14 0311 01/07/14 0243 01/08/14 0228 01/09/14 0455 01/10/14 0555  NA 139 141 141 143  142 140  K 4.8 4.4 4.3 3.9  3.8 3.7  CL 101 101 101 100  100 95*  CO2 16* 16* 19 23  22 25   GLUCOSE 104* 92 83 77  76 84  BUN 73* 77* 76* 71*  72* 66*  CREATININE 4.81* 4.82* 4.89* 4.84*  4.77* 4.61*  CALCIUM 8.7 8.7 8.7 8.3*  8.4 8.6  PHOS 6.3* 6.9* 7.2* 7.3*  7.3* 7.7*   Liver Function Tests:  Recent Labs Lab 01/06/14 0311 01/07/14 0243 01/08/14 0228 01/09/14 0455 01/10/14 0555  ALBUMIN 2.0* 2.1* 2.1* 2.1*  2.1* 2.3*   No results found for  this basename: LIPASE, AMYLASE,  in the last 168 hours No results found for this basename: AMMONIA,  in the last 168 hours CBC:  Recent Labs Lab 01/04/14 0217 01/05/14 0245 01/07/14 0243 01/09/14 0455  WBC 8.9 7.9 8.9 12.5*  NEUTROABS  --   --  6.4 9.8*  HGB 8.3* 8.1* 9.1* 8.9*  HCT 25.7* 24.9* 28.2* 27.6*  MCV 93.1 92.2 91.3 91.4  PLT 171 188 277 362   Cardiac Enzymes: No results found for this basename: CKTOTAL, CKMB, CKMBINDEX, TROPONINI,  in the last 168 hours BNP (last 3 results)  Recent Labs  07/28/13 1650  PROBNP 32203.0*   CBG:  Recent Labs Lab 01/09/14 1147 01/09/14 1646 01/09/14 2156 01/10/14 0749 01/10/14 1201  GLUCAP 98 98 74 97 122*    Recent Results (from the past 240 hour(s))  URINE CULTURE     Status: None   Collection Time    01/01/14 12:34 PM      Result Value Ref Range Status   Specimen Description URINE, CLEAN CATCH  Final   Special Requests NONE   Final   Culture  Setup Time     Final   Value: 01/01/2014 13:10     Performed at [facility] Count     Final   Value: >=100,000 COLONIES/ML     Performed at [facility]   Culture     Final   Value: STAPHYLOCOCCUS AUREUS     Note: RIFAMPIN AND GENTAMICIN SHOULD NOT BE USED AS SINGLE DRUGS FOR TREATMENT OF STAPH INFECTIONS.     Performed at [facility]   Report Status 01/03/2014 FINAL   Final   Organism ID, Bacteria STAPHYLOCOCCUS AUREUS   Final  CULTURE, BLOOD (ROUTINE X 2)     Status: None   Collection Time    01/01/14  1:50 PM      Result Value Ref Range Status   Specimen Description BLOOD ARM RIGHT   Final   Special Requests     Final   Value: BOTTLES DRAWN AEROBIC AND ANAEROBIC BLUE 10CC RED 5CC   Culture  Setup Time     Final   Value: 01/01/2014 22:00     Performed at [facility]   Culture     Final   Value: GROUP A STREP (S.PYOGENES) ISOLATED     Note: FAXED TO [facility] ATTN CYNTHIA GRANTHAM [id number] BY PEAKY     Note: Gram Stain  Report Called to,Read Back By and Verified With: [facility] LAFFARD 01/02/14 @ 12:27PM BY RUSCOE A. CRITICAL RESULT CALLED TO, READ BACK BY AND VERIFIED WITH: STUART WATERS 01/03/14 @ 9:25AM BY RUSCOE A.     Performed at [facility]   Report Status 01/04/2014 FINAL   Final  CULTURE, BLOOD (ROUTINE X 2)     Status: None   Collection Time    01/01/14  2:00 PM      Result Value Ref Range Status   Specimen Description BLOOD HAND RIGHT   Final   Special Requests BOTTLES DRAWN AEROBIC ONLY 10CC   Final   Culture  Setup Time     Final   Value: 01/01/2014 22:01     Performed at [facility]   Culture     Final   Value: GROUP A STREP (S.PYOGENES) ISOLATED     Note: FAXED TO [place] [facility] ATTN CYNTHIA GRANTHAM [id number] BY PEAKY     Note: Gram Stain Report Called to,Read Back By and Verified With: [facility] LAFFARD 01/02/14 @ 12:27PM BY RUSCOE A. CRITICAL RESULT CALLED TO, READ BACK BY AND VERIFIED WITH: STUART WATERS 01/03/14 @ 9:25AM BY RUSCOE A.     Performed at [facility]   Report Status 01/04/2014 FINAL   Final  MRSA PCR SCREENING     Status: None   Collection Time    01/01/14  7:00 PM      Result Value Ref Range Status   MRSA by PCR NEGATIVE  NEGATIVE Final   Comment:            The GeneXpert MRSA Assay (FDA     approved for NASAL specimens     only), is one component of a     comprehensive MRSA colonization     surveillance program. It is not     intended to diagnose MRSA     infection nor to guide or     monitor treatment for     MRSA infections.  CULTURE, BLOOD (ROUTINE X 2)  Status: None   Collection Time    01/01/14  8:16 PM      Result Value Ref Range Status   Specimen Description BLOOD RIGHT HAND   Final   Special Requests BOTTLES DRAWN AEROBIC ONLY 2CC   Final   Culture  Setup Time     Final   Value: 01/02/2014 00:46     Performed at [facility]   Culture     Final   Value: NO GROWTH 5 DAYS     Performed at [facility]   Report Status  01/08/2014 FINAL   Final  CULTURE, BLOOD (ROUTINE X 2)     Status: None   Collection Time    01/01/14  8:23 PM      Result Value Ref Range Status   Specimen Description BLOOD RIGHT ARM   Final   Special Requests     Final   Value: BOTTLES DRAWN AEROBIC AND ANAEROBIC 10CC AER,6CC ANA   Culture  Setup Time     Final   Value: 01/02/2014 00:46     Performed at [facility]   Culture     Final   Value: NO GROWTH 5 DAYS     Performed at [facility]   Report Status 01/08/2014 FINAL   Final  CULTURE, BLOOD (ROUTINE X 2)     Status: None   Collection Time    01/02/14  2:25 PM      Result Value Ref Range Status   Specimen Description BLOOD RIGHT HAND   Final   Special Requests BOTTLES DRAWN AEROBIC ONLY 5CC   Final   Culture  Setup Time     Final   Value: 01/02/2014 22:28     Performed at [facility]   Culture     Final   Value: NO GROWTH 5 DAYS     Performed at [facility]   Report Status 01/08/2014 FINAL   Final  CULTURE, BLOOD (ROUTINE X 2)     Status: None   Collection Time    01/02/14  2:40 PM      Result Value Ref Range Status   Specimen Description BLOOD RIGHT ARM   Final   Special Requests BOTTLES DRAWN AEROBIC AND ANAEROBIC 10CC EACH   Final   Culture  Setup Time     Final   Value: 01/02/2014 22:28     Performed at [facility]   Culture     Final   Value: NO GROWTH 5 DAYS     Performed at [facility]   Report Status 01/08/2014 FINAL   Final     Studies: No results found.  Scheduled Meds: . amLODipine  10 mg Oral Daily  . aspirin EC  81 mg Oral Daily  . carvedilol  12.5 mg Oral BID WC  .  ceFAZolin (ANCEF) IV  1 g Intravenous Q24H  . clindamycin (CLEOCIN) IV  600 mg Intravenous 3 times per day  . darbepoetin (ARANESP) injection - NON-DIALYSIS  100 mcg Subcutaneous Q Tue-1800  . docusate sodium  100 mg Oral BID  . feeding supplement (ENSURE COMPLETE)  237 mL Oral BID BM  . furosemide  80 mg Intravenous Q12H  .  heparin  5,000 Units Subcutaneous 3 times per day  . hydrALAZINE  150 mg Oral TID  . insulin aspart  0-15 Units Subcutaneous TID WC  . insulin aspart  0-5 Units Subcutaneous QHS  . silver sulfADIAZINE  Topical BID  . sodium bicarbonate  650 mg Oral BID  . sodium chloride  3 mL Intravenous Q12H   Continuous Infusions: . sodium chloride 10 mL/hr at 01/08/14 2031    Principal Problem:   Cellulitis Active Problems:   Hypertensive heart disease   Tobacco dependency   Chronic diastolic CHF (congestive heart failure)   Sepsis   Acute on chronic renal failure   Diabetes mellitus with peripheral vascular disease    Time spent: 25 min  Pleas KochJai Samtani, MD [facility] (P) [phone]

## 2014-01-10 NOTE — Progress Notes (Addendum)
Patient lethargic and pale, responsive to commands but doses back off to sleep. Pt is alert to self and place. CBG checked - 131. 02 sats 79% on RA. Patient had been weaned off oxygen during day. Placed back on 3L, sats at 98%. Pt slowly improving, becoming more awake. Son at bedside. Claiborne Billingsallahan, NP notified, no new orders given. Pt in no distress. Will continue to monitor pt.

## 2014-01-11 ENCOUNTER — Encounter (HOSPITAL_COMMUNITY): Payer: Self-pay | Admitting: Plastic Surgery

## 2014-01-11 ENCOUNTER — Encounter (HOSPITAL_COMMUNITY)
Admission: EM | Disposition: A | Discharge status: Skilled Nursing Facility | Payer: Self-pay | Source: Home / Self Care | Attending: Family Medicine

## 2014-01-11 ENCOUNTER — Inpatient Hospital Stay (HOSPITAL_COMMUNITY): Payer: Medicare Other | Admitting: Certified Registered Nurse Anesthetist

## 2014-01-11 ENCOUNTER — Encounter (HOSPITAL_COMMUNITY): Payer: Medicare Other | Admitting: Certified Registered Nurse Anesthetist

## 2014-01-11 DIAGNOSIS — S41109A Unspecified open wound of unspecified upper arm, initial encounter: Secondary | ICD-10-CM

## 2014-01-11 HISTORY — PX: APPLICATION OF A-CELL OF EXTREMITY: SHX6303

## 2014-01-11 HISTORY — PX: APPLICATION OF WOUND VAC: SHX5189

## 2014-01-11 HISTORY — PX: I & D EXTREMITY: SHX5045

## 2014-01-11 LAB — CBC WITH DIFFERENTIAL/PLATELET
BASOS ABS: 0.1 10*3/uL (ref 0.0–0.1)
BASOS PCT: 1 % (ref 0–1)
Eosinophils Absolute: 0.1 10*3/uL (ref 0.0–0.7)
Eosinophils Relative: 1 % (ref 0–5)
HEMATOCRIT: 28.8 % — AB (ref 36.0–46.0)
HEMOGLOBIN: 9 g/dL — AB (ref 12.0–15.0)
LYMPHS PCT: 10 % — AB (ref 12–46)
Lymphs Abs: 1.1 10*3/uL (ref 0.7–4.0)
MCH: 30.1 pg (ref 26.0–34.0)
MCHC: 31.3 g/dL (ref 30.0–36.0)
MCV: 96.3 fL (ref 78.0–100.0)
MONOS PCT: 6 % (ref 3–12)
Monocytes Absolute: 0.6 10*3/uL (ref 0.1–1.0)
NEUTROS ABS: 8.8 10*3/uL — AB (ref 1.7–7.7)
NEUTROS PCT: 82 % — AB (ref 43–77)
Platelets: 351 10*3/uL (ref 150–400)
RBC: 2.99 MIL/uL — ABNORMAL LOW (ref 3.87–5.11)
RDW: 14.5 % (ref 11.5–15.5)
WBC: 10.6 10*3/uL — AB (ref 4.0–10.5)

## 2014-01-11 LAB — BLOOD GAS, ARTERIAL
ACID-BASE EXCESS: 4.1 mmol/L — AB (ref 0.0–2.0)
BICARBONATE: 29.7 meq/L — AB (ref 20.0–24.0)
Drawn by: 23604
O2 Content: 3 L/min
O2 Saturation: 96.3 %
PCO2 ART: 57.9 mmHg — AB (ref 35.0–45.0)
PH ART: 7.329 — AB (ref 7.350–7.450)
PO2 ART: 96.6 mmHg (ref 80.0–100.0)
Patient temperature: 98.6
TCO2: 31.4 mmol/L (ref 0–100)

## 2014-01-11 LAB — GLUCOSE, CAPILLARY
GLUCOSE-CAPILLARY: 109 mg/dL — AB (ref 70–99)
GLUCOSE-CAPILLARY: 99 mg/dL (ref 70–99)
GLUCOSE-CAPILLARY: 98 mg/dL (ref 70–99)
Glucose-Capillary: 94 mg/dL (ref 70–99)
Glucose-Capillary: 97 mg/dL (ref 70–99)
Glucose-Capillary: 110 mg/dL — ABNORMAL HIGH (ref 70–99)

## 2014-01-11 LAB — RENAL FUNCTION PANEL
Albumin: 2.1 g/dL — ABNORMAL LOW (ref 3.5–5.2)
BUN: 66 mg/dL — ABNORMAL HIGH (ref 6–23)
CO2: 27 meq/L (ref 19–32)
Calcium: 8.3 mg/dL — ABNORMAL LOW (ref 8.4–10.5)
Chloride: 98 mEq/L (ref 96–112)
Creatinine, Ser: 4.84 mg/dL — ABNORMAL HIGH (ref 0.50–1.10)
GFR calc non Af Amer: 8 mL/min — ABNORMAL LOW (ref >90)
GFR, EST AFRICAN AMERICAN: 9 mL/min — AB (ref >90)
GLUCOSE: 98 mg/dL (ref 70–99)
PHOSPHORUS: 9.3 mg/dL — AB (ref 2.3–4.6)
POTASSIUM: 3.9 meq/L (ref 3.7–5.3)
Sodium: 141 mEq/L (ref 137–147)

## 2014-01-11 SURGERY — IRRIGATION AND DEBRIDEMENT EXTREMITY
Anesthesia: General | Site: Arm Lower | Laterality: Left

## 2014-01-11 MED ORDER — DIPHENHYDRAMINE HCL 50 MG/ML IJ SOLN: 12.5000 mg | Freq: Four times a day (QID) | INTRAMUSCULAR | Status: DC | PRN

## 2014-01-11 MED ORDER — FENTANYL CITRATE 0.05 MG/ML IJ SOLN
INTRAMUSCULAR | Status: AC
  Filled 2014-01-11: qty 2

## 2014-01-11 MED ORDER — EPINEPHRINE HCL 1 MG/ML IJ SOLN
INTRAMUSCULAR | Status: AC
  Filled 2014-01-11: qty 1

## 2014-01-11 MED ORDER — SODIUM CHLORIDE 0.9 % IR SOLN
Status: DC | PRN
  Administered 2014-01-11: 250 mL

## 2014-01-11 MED ORDER — ONDANSETRON HCL 4 MG/2ML IJ SOLN: 4.0000 mg | Freq: Four times a day (QID) | INTRAMUSCULAR | Status: DC | PRN

## 2014-01-11 MED ORDER — DIPHENHYDRAMINE HCL 12.5 MG/5ML PO ELIX
12.5000 mg | ORAL_SOLUTION | Freq: Four times a day (QID) | ORAL | Status: DC | PRN
  Filled 2014-01-11: qty 5

## 2014-01-11 MED ORDER — FENTANYL CITRATE 0.05 MG/ML IJ SOLN
INTRAMUSCULAR | Status: DC | PRN
  Administered 2014-01-11 (×3): 50 ug via INTRAVENOUS

## 2014-01-11 MED ORDER — ARTIFICIAL TEARS OP OINT
TOPICAL_OINTMENT | OPHTHALMIC | Status: AC
  Filled 2014-01-11: qty 3.5

## 2014-01-11 MED ORDER — LIDOCAINE HCL (CARDIAC) 20 MG/ML IV SOLN
INTRAVENOUS | Status: AC
  Filled 2014-01-11: qty 5

## 2014-01-11 MED ORDER — FENTANYL CITRATE 0.05 MG/ML IJ SOLN
INTRAMUSCULAR | Status: AC
  Filled 2014-01-11: qty 5

## 2014-01-11 MED ORDER — FENTANYL 10 MCG/ML IV SOLN
INTRAVENOUS | Status: DC
  Administered 2014-01-11: 10 ug/h via INTRAVENOUS
  Administered 2014-01-12: 190 ug via INTRAVENOUS
  Administered 2014-01-12: 15:00:00 via INTRAVENOUS
  Administered 2014-01-12: 130 ug via INTRAVENOUS
  Administered 2014-01-12: 250 ug via INTRAVENOUS
  Administered 2014-01-12: 04:00:00 via INTRAVENOUS
  Administered 2014-01-12: 110 ug via INTRAVENOUS
  Administered 2014-01-12: 280 ug via INTRAVENOUS
  Administered 2014-01-12: 80 ug via INTRAVENOUS
  Administered 2014-01-13: 01:00:00 via INTRAVENOUS
  Administered 2014-01-13: 40 ug via INTRAVENOUS
  Administered 2014-01-13: 20 ug via INTRAVENOUS
  Administered 2014-01-13: 230 ug via INTRAVENOUS
  Filled 2014-01-11 (×4): qty 50

## 2014-01-11 MED ORDER — SODIUM CHLORIDE 0.9 % IJ SOLN
INTRAMUSCULAR | Status: DC | PRN
  Administered 2014-01-11: 15:00:00

## 2014-01-11 MED ORDER — SODIUM CHLORIDE 0.9 % IJ SOLN: 9.0000 mL | INTRAMUSCULAR | Status: DC | PRN

## 2014-01-11 MED ORDER — POLYMYXIN B SULFATE 500000 UNITS IJ SOLR
INTRAMUSCULAR | Status: DC | PRN
  Administered 2014-01-11: 15:00:00

## 2014-01-11 MED ORDER — PROPOFOL 10 MG/ML IV BOLUS
INTRAVENOUS | Status: AC
  Filled 2014-01-11: qty 20

## 2014-01-11 MED ORDER — FENTANYL CITRATE 0.05 MG/ML IJ SOLN: 25.0000 ug | INTRAMUSCULAR | Status: DC | PRN

## 2014-01-11 MED ORDER — EPHEDRINE SULFATE 50 MG/ML IJ SOLN
INTRAMUSCULAR | Status: DC | PRN
  Administered 2014-01-11 (×3): 10 mg via INTRAVENOUS

## 2014-01-11 MED ORDER — OXYCODONE-ACETAMINOPHEN 5-325 MG PO TABS
ORAL_TABLET | ORAL | Status: AC
  Filled 2014-01-11: qty 1

## 2014-01-11 MED ORDER — HYDRALAZINE HCL 50 MG PO TABS
200.0000 mg | ORAL_TABLET | Freq: Three times a day (TID) | ORAL | Status: DC
  Administered 2014-01-11 – 2014-01-19 (×23): 200 mg via ORAL
  Filled 2014-01-11 (×28): qty 4

## 2014-01-11 MED ORDER — NALOXONE HCL 0.4 MG/ML IJ SOLN: 0.4000 mg | INTRAMUSCULAR | Status: DC | PRN

## 2014-01-11 MED ORDER — SODIUM CHLORIDE 0.9 % IV SOLN
INTRAVENOUS | Status: DC
  Administered 2014-01-11: 25 mL/h via INTRAVENOUS

## 2014-01-11 MED ORDER — EPINEPHRINE HCL (NASAL) 0.1 % NA SOLN
NASAL | Status: AC
  Filled 2014-01-11: qty 30

## 2014-01-11 MED ORDER — LIDOCAINE HCL (CARDIAC) 20 MG/ML IV SOLN
INTRAVENOUS | Status: DC | PRN
  Administered 2014-01-11: 70 mg via INTRAVENOUS

## 2014-01-11 MED ORDER — FENTANYL CITRATE 0.05 MG/ML IJ SOLN
25.0000 ug | INTRAMUSCULAR | Status: DC | PRN
  Administered 2014-01-11 (×5): 50 ug via INTRAVENOUS

## 2014-01-11 MED ORDER — ONDANSETRON HCL 4 MG/2ML IJ SOLN
INTRAMUSCULAR | Status: DC | PRN
  Administered 2014-01-11: 4 mg via INTRAVENOUS

## 2014-01-11 MED ORDER — ARTIFICIAL TEARS OP OINT
TOPICAL_OINTMENT | OPHTHALMIC | Status: DC | PRN
  Administered 2014-01-11: 1 via OPHTHALMIC

## 2014-01-11 MED ORDER — PROPOFOL 10 MG/ML IV BOLUS
INTRAVENOUS | Status: DC | PRN
  Administered 2014-01-11: 140 mg via INTRAVENOUS

## 2014-01-11 MED ORDER — ONDANSETRON HCL 4 MG/2ML IJ SOLN
INTRAMUSCULAR | Status: AC
  Filled 2014-01-11: qty 2

## 2014-01-11 SURGICAL SUPPLY — 48 items
BANDAGE ELASTIC 4 VELCRO ST LF (GAUZE/BANDAGES/DRESSINGS) IMPLANT
BANDAGE ELASTIC 6 VELCRO ST LF (GAUZE/BANDAGES/DRESSINGS) ×2 IMPLANT
BANDAGE GAUZE ELAST BULKY 4 IN (GAUZE/BANDAGES/DRESSINGS) ×2 IMPLANT
BLADE 10 SAFETY STRL DISP (BLADE) ×3 IMPLANT
BLADE SURG ROTATE 9660 (MISCELLANEOUS) IMPLANT
BNDG GAUZE ELAST 4 BULKY (GAUZE/BANDAGES/DRESSINGS) ×2 IMPLANT
BURN MATRIX MATRISTEM 10X15 (Tissue) ×2 IMPLANT
CANISTER SUCTION 2500CC (MISCELLANEOUS) ×3 IMPLANT
CANISTER WOUND CARE 500ML ATS (WOUND CARE) ×2 IMPLANT
CHLORAPREP W/TINT 26ML (MISCELLANEOUS) IMPLANT
COVER SURGICAL LIGHT HANDLE (MISCELLANEOUS) ×3 IMPLANT
DRAPE EXTREMITY T 121X128X90 (DRAPE) IMPLANT
DRAPE INCISE IOBAN 66X45 STRL (DRAPES) ×4 IMPLANT
DRAPE ORTHO SPLIT 77X108 STRL (DRAPES)
DRAPE SURG ORHT 6 SPLT 77X108 (DRAPES) IMPLANT
DRSG ADAPTIC 3X8 NADH LF (GAUZE/BANDAGES/DRESSINGS) ×2 IMPLANT
DRSG PAD ABDOMINAL 8X10 ST (GAUZE/BANDAGES/DRESSINGS) IMPLANT
DRSG TELFA 3X8 NADH (GAUZE/BANDAGES/DRESSINGS) ×3 IMPLANT
DRSG VAC ATS LRG SENSATRAC (GAUZE/BANDAGES/DRESSINGS) ×4 IMPLANT
ELECT REM PT RETURN 9FT ADLT (ELECTROSURGICAL) ×3
ELECTRODE REM PT RTRN 9FT ADLT (ELECTROSURGICAL) ×1 IMPLANT
GLOVE BIO SURGEON STRL SZ 6.5 (GLOVE) ×2 IMPLANT
GLOVE BIO SURGEONS STRL SZ 6.5 (GLOVE) ×1
GLOVE BIOGEL PI IND STRL 6.5 (GLOVE) IMPLANT
GLOVE BIOGEL PI INDICATOR 6.5 (GLOVE) ×2
GLOVE SURG SS PI 6.0 STRL IVOR (GLOVE) ×2 IMPLANT
GOWN STRL REUS W/ TWL LRG LVL3 (GOWN DISPOSABLE) ×2 IMPLANT
GOWN STRL REUS W/TWL LRG LVL3 (GOWN DISPOSABLE) ×6
HANDPIECE INTERPULSE COAX TIP (DISPOSABLE)
KIT BASIN OR (CUSTOM PROCEDURE TRAY) ×3 IMPLANT
KIT ROOM TURNOVER OR (KITS) ×3 IMPLANT
MATRIX SURGICAL PSM 7X10CM (Tissue) ×4 IMPLANT
MICROMATRIX 1000MG (Tissue) ×6 IMPLANT
NS IRRIG 1000ML POUR BTL (IV SOLUTION) ×3 IMPLANT
PACK GENERAL/GYN (CUSTOM PROCEDURE TRAY) ×3 IMPLANT
PAD ARMBOARD 7.5X6 YLW CONV (MISCELLANEOUS) ×6 IMPLANT
PAD DRESSING TELFA 3X8 NADH (GAUZE/BANDAGES/DRESSINGS) IMPLANT
SET HNDPC FAN SPRY TIP SCT (DISPOSABLE) IMPLANT
SOLUTION PARTIC MCRMTRX 1000MG (Tissue) IMPLANT
SPLINT FIBERGLASS 3X35 (CAST SUPPLIES) ×2 IMPLANT
SPONGE GAUZE 4X4 12PLY (GAUZE/BANDAGES/DRESSINGS) ×2 IMPLANT
SPONGE GAUZE 4X4 12PLY STER LF (GAUZE/BANDAGES/DRESSINGS) ×2 IMPLANT
STAPLER VISISTAT 35W (STAPLE) ×2 IMPLANT
SUT VIC AB 5-0 TF 27 (SUTURE) ×4 IMPLANT
TOWEL OR 17X24 6PK STRL BLUE (TOWEL DISPOSABLE) ×3 IMPLANT
TOWEL OR 17X26 10 PK STRL BLUE (TOWEL DISPOSABLE) ×3 IMPLANT
UNDERPAD 30X30 INCONTINENT (UNDERPADS AND DIAPERS) ×3 IMPLANT
WATER STERILE IRR 1000ML POUR (IV SOLUTION) IMPLANT

## 2014-01-11 NOTE — Anesthesia Preprocedure Evaluation (Addendum)
Anesthesia Evaluation  Patient identified by MRN, date of birth, ID band Patient awake    Reviewed: Allergy & Precautions, H&P , NPO status , Patient's Chart, lab work & pertinent test results, reviewed documented beta blocker date and time   Airway Mallampati: II TM Distance: >3 FB Neck ROM: Full    Dental  (+) Edentulous Upper, Edentulous Lower, Dental Advisory Given   Pulmonary Current Smoker,  breath sounds clear to auscultation        Cardiovascular hypertension, Pt. on home beta blockers + CAD, + Past MI, + Peripheral Vascular Disease and +CHF Rhythm:Regular     Neuro/Psych Anxiety Depression    GI/Hepatic   Endo/Other  diabetes, Type 2  Renal/GU Renal InsufficiencyRenal disease     Musculoskeletal   Abdominal   Peds  Hematology   Anesthesia Other Findings   Reproductive/Obstetrics                          Anesthesia Physical Anesthesia Plan  ASA: IV  Anesthesia Plan:    Post-op Pain Management:    Induction: Intravenous  Airway Management Planned: LMA  Additional Equipment:   Intra-op Plan:   Post-operative Plan: Extubation in OR  Informed Consent: I have reviewed the patients History and Physical, chart, labs and discussed the procedure including the risks, benefits and alternatives for the proposed anesthesia with the patient or authorized representative who has indicated his/her understanding and acceptance.   Dental advisory given  Plan Discussed with: CRNA and Anesthesiologist  Anesthesia Plan Comments:         Anesthesia Quick Evaluation

## 2014-01-11 NOTE — Progress Notes (Addendum)
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  She presented with complaints of left upper extremity pain, swelling, erythema, having an associated functional decline, generalized weakness, feeling ill. Patient found to have severe left upper extremity cellulitis, with initial lab work showing white count of 3000 with presence of bands, temperature 101.1, development of acute on chronic renal failure. Given severity of presentation a CT scan of her left upper extremity was obtained in the emergency room which revealed severe diffuse edematous changes consistent with severe cellulitis. She was initially started on broad-spectrum IV antibiotic therapy with vancomycin and Zosyn. General surgery was consulted.  She showed little improvement over the following days, having further deterioration to kidney function as well as becoming more encephalopathic. Blood cultures obtained on 01/01/2014, back positive for group A streptococcus in both sets. Repeat blood cultures were obtained. General surgery recommended orthopedic surgery consultation.  On 01/04/2014 patient was seen and evaluated by Dr. Luiz Blare of orthopedic surgery. He recommended ongoing IV antimicrobial therapy and did not feel surgical intervention was warranted at this time. Dr. Drue Second of infectious disease was consulted and recommended the discontinuation of vancomycin and Zosyn and starting Clindamycin and Ancef. With patient's deterioration to kidney function Dr. Kathrene Bongo of nephrology was consulted as well. Though initially we had planned on re-imaging her arm, speaking with Dr. Luiz Blare, he did  not recommend pursuing MRI.  Plastic surgery consult placed 6/25 and kept NPO for potenttial surgery                                                                             Assessment/Plan: 1. Sepsis -Present on admission, evidence by leukopenia  3000, presence of bands, temperature of 101.1, respiratory rate of 27, having positive blood cultures--Microbiology reporting that blood cultures positive for Group A Strep x 2 sets  Repeat blood cultures drawn on 01/02/2014 showing no growth.   Note Urine also + for >100,000 Staph -Source of infection likely to be left upper extremity cellulitis.  -ID consulted recommended  to Clindamycin and Ancef on 6/22 -WOC nurse asked to assess area-changed dressings to silvadene -Plastic surgery Dr. Kelly Splinter consulted-for debridement on Monday 6/29  2.  Left upper extremity cellulitis -CT scan of left upper extremity showing severe diffuse changes consistent with cellulitis.  -Will continue IV AB coverage, now on Clindamycin and Ancef -Wound care, General Surgery, plastic surgery and Orthopedics consulted -Blood cultures growing Group A Strep -prealbumin 8.4 indicating poor protein reserves-defer to surgery  3.  Acute on Chronic Renal Failure -Patient with history of stage IV CKD, baseline creatinine near 3.4.  -AM lab work showing creatinine of 4/76--Seems to e trending dow , is 4.6 today -likely worsened by IV contrast 01/01/14 -Placed on bicard gtt, now BMP showing bicarb of 19. bicarb d/c 6/26 -Dr Kathrene Bongo of Nephrology input appreciated  -unlcear if would want dialysis  4. Chronic diastolic CHF -Compensated.  -Diuretics stopped on admission.  -Follow  volume status -lasix started by nephrology on currently 80 mg iv bid -still net + ~7 liters  5.  Metabolic Acidosis -Bicab completely resolved  6. Staphylococcal pyelonephritis -On broad spectrum AB therapy  7. Type 2 diabetes Mellitus -Continue accuchecks ac and hs -sugars in  70-130's  8. Toxic metabolic encephalopathy probably 2/2 to uremia/sepsis -much improved compared to yesterday -monitor for delirium  9. HTn -poor control -clonidine added per renal 6/28 0.1 mg for pressure above 170 contue hydralazine 150 tid-increased 6/29 to 200 tid, coreg 12.5 bid increased to 25 bid, amlodipine 10  Code Status: Full Family Communication: updated at bedside Disposition Plan: Continue IV fluids, IV AB's   Consultants:  Surgery   Antibiotics:  Vanc  Zosyn  HPI/Subjective:  Sleepy Needed narcan overnight as became somnolent presumably 2/2 to IV morphine Slightly confused again this am and not at regular baseline Son in room NPO for surgery today       Objective: Filed Vitals:   01/11/14 0904  BP: 165/53  Pulse: 106  Temp:   Resp:     Intake/Output Summary (Last 24 hours) at 01/11/14 1012 Last data filed at 01/11/14 0602  Gross per 24 hour  Intake    440 ml  Output   1800 ml  Net  -1360 ml   Filed Weights   01/08/14 0800 01/09/14 0436 01/11/14 0500  Weight: 73.4 kg (161 lb 13.1 oz) 72.6 kg (160 lb 0.9 oz) 71.6 kg (157 lb 13.6 oz)    Exam:   General:  Patient is lethargic, confused, can not follow commands  Cardiovascular: Regular rate and rhythm, normal S1S2  Respiratory: Clear to auscultation, normal inspiratory effort  Abdomen: Soft nontender nondistended   Data Reviewed: Basic Metabolic Panel:  Recent Labs Lab 01/07/14 0243 01/08/14 0228 01/09/14 0455 01/10/14 0555 01/11/14 0544  NA 141 141 143  142 140 141  K 4.4 4.3 3.9  3.8 3.7 3.9  CL 101 101 100  100 95* 98  CO2 16* 19 23  22 25 27   GLUCOSE 92 83 77  76 84 98  BUN 77* 76* 71*  72* 66* 66*  CREATININE 4.82* 4.89* 4.84*  4.77* 4.61* 4.84*  CALCIUM 8.7 8.7 8.3*  8.4 8.6 8.3*  PHOS 6.9* 7.2* 7.3*  7.3* 7.7* 9.3*   Liver Function Tests:  Recent Labs Lab 01/07/14 0243 01/08/14 0228 01/09/14 0455 01/10/14 0555 01/11/14 0544  ALBUMIN 2.1*  2.1* 2.1*  2.1* 2.3* 2.1*   No results found for this basename: LIPASE, AMYLASE,  in the last 168 hours No results found for this basename: AMMONIA,  in the last 168 hours CBC:  Recent Labs Lab 01/05/14 0245 01/07/14 0243 01/09/14 0455 01/11/14 0544  WBC 7.9 8.9 12.5* 10.6*  NEUTROABS  --  6.4 9.8* 8.8*  HGB 8.1* 9.1* 8.9* 9.0*  HCT 24.9* 28.2* 27.6* 28.8*  MCV 92.2 91.3 91.4 96.3  PLT 188 277 362 351   Cardiac Enzymes: No results found for this basename: CKTOTAL, CKMB, CKMBINDEX, TROPONINI,  in the last 168 hours BNP (last 3 results)  Recent Labs  07/28/13 1650  PROBNP 32203.0*   CBG:  Recent Labs Lab 01/10/14 0749 01/10/14 1201 01/10/14 1658 01/10/14 2121 01/11/14 0745  GLUCAP 97 122* 109* 131* 109*    Recent Results (from the past 240 hour(s))  URINE CULTURE     Status: None   Collection Time    01/01/14 12:34 PM      Result Value Ref Range Status  Specimen Description URINE, CLEAN CATCH   Final   Special Requests NONE   Final   Culture  Setup Time     Final   Value: 01/01/2014 13:10     Performed at [facility]   Colony Count     Final   Value: >=100,000 COLONIES/ML     Performed at [facility]   Culture     Final   Value: STAPHYLOCOCCUS AUREUS     Note: RIFAMPIN AND GENTAMICIN SHOULD NOT BE USED AS SINGLE DRUGS FOR TREATMENT OF STAPH INFECTIONS.     Performed at [facility]   Report Status 01/03/2014 FINAL   Final   Organism ID, Bacteria STAPHYLOCOCCUS AUREUS   Final  CULTURE, BLOOD (ROUTINE X 2)     Status: None   Collection Time    01/01/14  1:50 PM      Result Value Ref Range Status   Specimen Description BLOOD ARM RIGHT   Final   Special Requests     Final   Value: BOTTLES DRAWN AEROBIC AND ANAEROBIC BLUE 10CC RED 5CC   Culture  Setup Time     Final   Value: 01/01/2014 22:00     Performed at [facility]   Culture     Final   Value: GROUP A STREP (S.PYOGENES) ISOLATED     Note: FAXED TO [facility]  ATTN CYNTHIA GRANTHAM [phone] BY PEAKY     Note: Gram Stain Report Called to,Read Back By and Verified With: [facility] 01/02/14 @ 12:27PM BY RUSCOE A. CRITICAL RESULT CALLED TO, READ BACK BY AND VERIFIED WITH: STUART WATERS 01/03/14 @ 9:25AM BY RUSCOE A.     Performed at [facility]   Report Status 01/04/2014 FINAL   Final  CULTURE, BLOOD (ROUTINE X 2)     Status: None   Collection Time    01/01/14  2:00 PM      Result Value Ref Range Status   Specimen Description BLOOD HAND RIGHT   Final   Special Requests BOTTLES DRAWN AEROBIC ONLY 10CC   Final   Culture  Setup Time     Final   Value: 01/01/2014 22:01     Performed at [facility]   Culture     Final   Value: GROUP A STREP (S.PYOGENES) ISOLATED     Note: FAXED TO [facility] ATTN CYNTHIA GRANTHAM [phone] BY PEAKY     Note: Gram Stain Report Called to,Read Back By and Verified With: [facility] 01/02/14 @ 12:27PM BY RUSCOE A. CRITICAL RESULT CALLED TO, READ BACK BY AND VERIFIED WITH: STUART WATERS 01/03/14 @ 9:25AM BY RUSCOE A.     Performed at [facility]   Report Status 01/04/2014 FINAL   Final  MRSA PCR SCREENING     Status: None   Collection Time    01/01/14  7:00 PM      Result Value Ref Range Status   MRSA by PCR NEGATIVE  NEGATIVE Final   Comment:            The GeneXpert MRSA Assay (FDA     approved for NASAL specimens     only), is one component of a     comprehensive MRSA colonization     surveillance program. It is not     intended to diagnose MRSA     infection nor to guide or     monitor treatment for     MRSA infections.  CULTURE, BLOOD (ROUTINE X 2)     Status: None   Collection Time    01/01/14  8:16 PM      Result Value Ref Range Status   Specimen Description BLOOD RIGHT HAND   Final   Special Requests BOTTLES DRAWN AEROBIC ONLY 2CC   Final   Culture  Setup Time     Final   Value: 01/02/2014 00:46     Performed at [facility]   Culture     Final   Value: NO GROWTH 5  DAYS     Performed at [facility]   Report Status 01/08/2014 FINAL   Final  CULTURE, BLOOD (ROUTINE X 2)     Status: None   Collection Time    01/01/14  8:23 PM      Result Value Ref Range Status   Specimen Description BLOOD RIGHT ARM   Final   Special Requests     Final   Value: BOTTLES DRAWN AEROBIC AND ANAEROBIC 10CC AER,6CC ANA   Culture  Setup Time     Final   Value: 01/02/2014 00:46     Performed at [facility]   Culture     Final   Value: NO GROWTH 5 DAYS     Performed at [facility]   Report Status 01/08/2014 FINAL   Final  CULTURE, BLOOD (ROUTINE X 2)     Status: None   Collection Time    01/02/14  2:25 PM      Result Value Ref Range Status   Specimen Description BLOOD RIGHT HAND   Final   Special Requests BOTTLES DRAWN AEROBIC ONLY 5CC   Final   Culture  Setup Time     Final   Value: 01/02/2014 22:28     Performed at [facility]   Culture     Final   Value: NO GROWTH 5 DAYS     Performed at [facility]   Report Status 01/08/2014 FINAL   Final  CULTURE, BLOOD (ROUTINE X 2)     Status: None   Collection Time    01/02/14  2:40 PM      Result Value Ref Range Status   Specimen Description BLOOD RIGHT ARM   Final   Special Requests BOTTLES DRAWN AEROBIC AND ANAEROBIC 10CC EACH   Final   Culture  Setup Time     Final   Value: 01/02/2014 22:28     Performed at [facility]   Culture     Final   Value: NO GROWTH 5 DAYS     Performed at [facility]   Report Status 01/08/2014 FINAL   Final     Studies: No results found.  Scheduled Meds: . amLODipine  10 mg Oral Daily  . aspirin EC  81 mg Oral Daily  . carvedilol  25 mg Oral BID WC  .  ceFAZolin (ANCEF) IV  1 g Intravenous Q24H  . darbepoetin (ARANESP) injection - NON-DIALYSIS  100 mcg Subcutaneous Q Tue-1800  . docusate sodium  100 mg Oral BID  . feeding supplement (ENSURE COMPLETE)  237 mL Oral BID BM  . feeding supplement (ENSURE)  1 Container Oral  TID BM  . furosemide  80 mg Intravenous Q12H  . heparin  5,000 Units Subcutaneous 3 times per day  . hydrALAZINE  150 mg Oral TID  . insulin aspart  0-15 Units Subcutaneous TID WC  . insulin aspart  0-5 Units Subcutaneous  QHS  . naloxone      . promethazine  12.5 mg Intravenous Once  . silver sulfADIAZINE   Topical BID  . sodium bicarbonate  650 mg Oral BID  . sodium chloride  3 mL Intravenous Q12H   Continuous Infusions: . sodium chloride 10 mL/hr at 01/08/14 2031    Principal Problem:   Cellulitis Active Problems:   Hypertensive heart disease   Tobacco dependency   Chronic diastolic CHF (congestive heart failure)   Sepsis   Acute on chronic renal failure   Diabetes mellitus with peripheral vascular disease    Time spent: 25 min  Pleas KochJai Gerrit Rafalski, MD [facility] (P) [phone]

## 2014-01-11 NOTE — Brief Op Note (Signed)
01/01/2014 - 01/11/2014  3:37 PM  PATIENT:  Tricia BeathMargie E [name]  80 y.o. female  PRE-OPERATIVE DIAGNOSIS:  swelling with servere cellulitis left arm  POST-OPERATIVE DIAGNOSIS:  Swelling and severe cellulitis left arm  PROCEDURE:  Procedure(s): IRRIGATION AND DEBRIDEMENT ARM (Left) APPLICATION OF WOUND VAC (Left)  SURGEON:  Surgeon(s) and Role:    * Claire Sanger, DO - Primary  PHYSICIAN ASSISTANT: none  ASSISTANTS: none   ANESTHESIA:   general  EBL:  Total I/O In: 500 [I.V.:500] Out: -   BLOOD ADMINISTERED:none  DRAINS: none   LOCAL MEDICATIONS USED:  NONE  SPECIMEN:  No Specimen  DISPOSITION OF SPECIMEN:  N/A  COUNTS:  YES  TOURNIQUET:  * No tourniquets in log *  DICTATION: .Dragon Dictation  PLAN OF CARE: Admit to inpatient   PATIENT DISPOSITION:  PACU - hemodynamically stable.   Delay start of Pharmacological VTE agent (>24hrs) due to surgical blood loss or risk of bleeding: no

## 2014-01-11 NOTE — Progress Notes (Signed)
Patient more somnolent than normal.  Patient arousable to voice, but with blank stare and slow to respond to questions and commands.  Patient received Morphine IV this afternoon.  0.4 Narcan given IV.  Patient more awake and interactive.  Assisted patient to sit on the side of the bed.  She is not able to hold herself up, and she complained of feeling weak.  BP 128/33 HR 74  RR 18 O2 sat 94% on 3L [place].  Lung sounds clear decreased in the bases.  RN notified MD of patient status.  Awaiting ABG results.  Will continue to monitor.

## 2014-01-11 NOTE — Progress Notes (Signed)
Patient ID: Tricia [name], female   DOB: 1934-05-15, 80 y.o.   MRN: [id number] S:s/p debridement and c/o pain. O:BP 165/53  Pulse 106  Temp(Src) 98.5 F (36.9 C) (Oral)  Resp 16  Ht 5\' 8"  (1.727 m)  Wt 71.6 kg (157 lb 13.6 oz)  BMI 24.01 kg/m2  SpO2 95%  Intake/Output Summary (Last 24 hours) at 01/11/14 1326 Last data filed at 01/11/14 0602  Gross per 24 hour  Intake    440 ml  Output   1800 ml  Net  -1360 ml   Intake/Output: I/O last 3 completed shifts: In: 3073 [P.O.:1070; I.V.:3; Other:1800; IV Piggyback:200] Out: 1800 [Urine:1800]  Intake/Output this shift:    Weight change:  Gen: WD WN WF in NAD Lungs- CTA CVS- RRR no rub Abd- +BS, soft, NT/ND Ext- wound vac on left arm/hand, s/p right AKA, no edema   Recent Labs Lab 01/05/14 0245 01/06/14 0311 01/07/14 0243 01/08/14 0228 01/09/14 0455 01/10/14 0555 01/11/14 0544  NA 137 139 141 141 143  142 140 141  K 4.4 4.8 4.4 4.3 3.9  3.8 3.7 3.9  CL 100 101 101 101 100  100 95* 98  CO2 19 16* 16* 19 23  22 25 27   GLUCOSE 104* 104* 92 83 77  76 84 98  BUN 76* 73* 77* 76* 71*  72* 66* 66*  CREATININE 4.76* 4.81* 4.82* 4.89* 4.84*  4.77* 4.61* 4.84*  ALBUMIN  --  2.0* 2.1* 2.1* 2.1*  2.1* 2.3* 2.1*  CALCIUM 8.6 8.7 8.7 8.7 8.3*  8.4 8.6 8.3*  PHOS  --  6.3* 6.9* 7.2* 7.3*  7.3* 7.7* 9.3*   Liver Function Tests:  Recent Labs Lab 01/09/14 0455 01/10/14 0555 01/11/14 0544  ALBUMIN 2.1*  2.1* 2.3* 2.1*   No results found for this basename: LIPASE, AMYLASE,  in the last 168 hours No results found for this basename: AMMONIA,  in the last 168 hours CBC:  Recent Labs Lab 01/05/14 0245 01/07/14 0243 01/09/14 0455 01/11/14 0544  WBC 7.9 8.9 12.5* 10.6*  NEUTROABS  --  6.4 9.8* 8.8*  HGB 8.1* 9.1* 8.9* 9.0*  HCT 24.9* 28.2* 27.6* 28.8*  MCV 92.2 91.3 91.4 96.3  PLT 188 277 362 351   Cardiac Enzymes: No results found for this basename: CKTOTAL, CKMB, CKMBINDEX, TROPONINI,  in the last 168  hours CBG:  Recent Labs Lab 01/10/14 1201 01/10/14 1658 01/10/14 2121 01/11/14 0745 01/11/14 1153  GLUCAP 122* 109* 131* 109* 99    Iron Studies: No results found for this basename: IRON, TIBC, TRANSFERRIN, FERRITIN,  in the last 72 hours Studies/Results: No results found. Marland Kitchen. amLODipine  10 mg Oral Daily  . aspirin EC  81 mg Oral Daily  . carvedilol  25 mg Oral BID WC  .  ceFAZolin (ANCEF) IV  1 g Intravenous Q24H  . darbepoetin (ARANESP) injection - NON-DIALYSIS  100 mcg Subcutaneous Q Tue-1800  . docusate sodium  100 mg Oral BID  . feeding supplement (ENSURE COMPLETE)  237 mL Oral BID BM  . feeding supplement (ENSURE)  1 Container Oral TID BM  . furosemide  80 mg Intravenous Q12H  . heparin  5,000 Units Subcutaneous 3 times per day  . hydrALAZINE  200 mg Oral TID  . insulin aspart  0-15 Units Subcutaneous TID WC  . insulin aspart  0-5 Units Subcutaneous QHS  . promethazine  12.5 mg Intravenous Once  . silver sulfADIAZINE   Topical BID  . sodium bicarbonate  650 mg Oral BID  . sodium chloride  3 mL Intravenous Q12H    BMET    Component Value Date/Time   NA 141 01/11/2014 0544   K 3.9 01/11/2014 0544   CL 98 01/11/2014 0544   CO2 27 01/11/2014 0544   GLUCOSE 98 01/11/2014 0544   BUN 66* 01/11/2014 0544   CREATININE 4.84* 01/11/2014 0544   CALCIUM 8.3* 01/11/2014 0544   GFRNONAA 8* 01/11/2014 0544   GFRAA 9* 01/11/2014 0544   CBC    Component Value Date/Time   WBC 10.6* 01/11/2014 0544   RBC 2.99* 01/11/2014 0544   HGB 9.0* 01/11/2014 0544   HCT 28.8* 01/11/2014 0544   PLT 351 01/11/2014 0544   MCV 96.3 01/11/2014 0544   MCH 30.1 01/11/2014 0544   MCHC 31.3 01/11/2014 0544   RDW 14.5 01/11/2014 0544   LYMPHSABS 1.1 01/11/2014 0544   MONOABS 0.6 01/11/2014 0544   EOSABS 0.1 01/11/2014 0544   BASOSABS 0.1 01/11/2014 0544     Assessment/Plan:  1. AKI/CKD in setting of cellulitis and bacteremia.  Non-oliguric.  Scr without sig change over the last 72 hours.  She has not been  interested in RRT as an outpt and thankfully no indication at this time.  Will cont to follow. 2. Cellulitis/strep bacteremia- for surgical debridement today per plastics 3. HTN- still elevated on hydralazine and norvasc as well as coreg.  No ACE/ARB due to advanced CKD 4. Anemia- on aranesp 5. Metabolic acidosis- improved with po bicarb 6. PVD 7. Dispo- still with intermittent confusion related to narcotics.  Weak, may need SNF vs. HHC at discharge.  Still does not want to commit to dialysis.  COLADONATO,JOSEPH A

## 2014-01-11 NOTE — Progress Notes (Addendum)
NUTRITION FOLLOW-UP  DOCUMENTATION CODES Per approved criteria  -Not Applicable   INTERVENTION: Change Ensure Complete to [employer] Breeze. Continue Ensure Pudding PO TID. RD to continue to follow nutrition care plan.  NUTRITION DIAGNOSIS: Inadequate oral intake related to poor appetite as evidenced by son report   Goal: Pt to meet >/= 90% of their estimated nutrition needs   Monitor:  PO & supplemental intake, weight, labs, I/O's  ASSESSMENT: 80 y.o. female with a history of multiple comorbidities including DM, peripheral vascular disease, status post right AKA, stage IV CKD, chronic diastolic CHF; presented to the ER with complaints of generalized weakness, feeling poorly and left upper extremity swelling.   Pt with severe cellulitis of L arm. Plan for I&D of arm today.  Rapid response called early this morning 2/2 lethargy/somnolence.  Per son at bedside, pt with very poor oral intake since admission. He states that she doesn't really care for the hospital food and that he has been supplementing her diet with foods from outside the hospital. Pt does not like Ensure but is agreeable to trying [employer]the[employer]ource Breeze.  Prealbumin is low at 8.4 Phosphorus elevated at 9.3  Height: Ht Readings from Last 1 Encounters:  01/01/14 5\' 8"  (1.727 m)    Weight: Wt Readings from Last 1 Encounters:  01/11/14 157 lb 13.6 oz (71.6 kg)   BMI: 27.4 kg/m2 -- adjusted for AKA  Estimated Nutritional Needs: Kcal: 1800-2000 Protein: 85-95 gm Fluid: 1.8-2.0 L  Skin:  L arm swelling  Diet Order: NPO   Intake/Output Summary (Last 24 hours) at 01/11/14 1255 Last data filed at 01/11/14 0602  Gross per 24 hour  Intake    440 ml  Output   1800 ml  Net  -1360 ml    Last BM: 6/29  Labs:   Recent Labs Lab 01/09/14 0455 01/10/14 0555 01/11/14 0544  NA 143  142 140 141  K 3.9  3.8 3.7 3.9  CL 100  100 95* 98  CO2 23  22 25 27   BUN 71*  72* 66* 66*  CREATININE 4.84*  4.77*  4.61* 4.84*  CALCIUM 8.3*  8.4 8.6 8.3*  PHOS 7.3*  7.3* 7.7* 9.3*  GLUCOSE 77  76 84 98    CBG (last 3)   Recent Labs  01/10/14 2121 01/11/14 0745 01/11/14 1153  GLUCAP 131* 109* 99   Prealbumin  Date/Time Value Ref Range Status  01/07/2014  2:05 PM 8.4* 17.0 - 34.0 mg/dL Final     Performed at [facility]     Scheduled Meds: . amLODipine  10 mg Oral Daily  . aspirin EC  81 mg Oral Daily  . carvedilol  25 mg Oral BID WC  .  ceFAZolin (ANCEF) IV  1 g Intravenous Q24H  . darbepoetin (ARANESP) injection - NON-DIALYSIS  100 mcg Subcutaneous Q Tue-1800  . docusate sodium  100 mg Oral BID  . feeding supplement (ENSURE COMPLETE)  237 mL Oral BID BM  . feeding supplement (ENSURE)  1 Container Oral TID BM  . furosemide  80 mg Intravenous Q12H  . heparin  5,000 Units Subcutaneous 3 times per day  . hydrALAZINE  200 mg Oral TID  . insulin aspart  0-15 Units Subcutaneous TID WC  . insulin aspart  0-5 Units Subcutaneous QHS  . promethazine  12.5 mg Intravenous Once  . silver sulfADIAZINE   Topical BID  . sodium bicarbonate  650 mg Oral BID  . sodium chloride  3 mL Intravenous  Q12H    Continuous Infusions: . sodium chloride 10 mL/hr at 01/08/14 2031    Jarold MottoSamantha Worley MS, RD, LDN Inpatient Registered Dietitian Pager: [phone] After-hours pager: [phone]

## 2014-01-11 NOTE — Progress Notes (Addendum)
Patient still lethargic this morning but responding to verbal commands. V/s stable. Assisted in bath this morning. Pt able to assist RN in rolling to opposite sides of bed.

## 2014-01-11 NOTE — Progress Notes (Signed)
Rapid Response nurse, [employer] MechanicHelle,  to see patient. V/s stable. Pt still lethargic but responds to verbal command. Pt had morphine early afternoon. Rapid response nurse ordered 0.4mg  narcan IV for RN to give. RN, rapid nurse, and charge nurse assisted pt to sit on the side of the bed, pt became more alert, but not back to baseline. Pt states feeling tired and weak. Son at bedside. Claiborne Billingsallahan, NP notified of patient's condition. Orders placed for ABGs. Will notify Claiborne Billingsallahan, NP when ABG results.

## 2014-01-11 NOTE — H&P (View-Only) (Signed)
  Subjective: The patient is a 79 yrs old wf here for treatment of her left arm swelling.  The   Objective: Vital signs in last 24 hours: Temp:  [97.5 F (36.4 C)-98.4 F (36.9 C)] 97.9 F (36.6 C) (06/26 1600) Pulse Rate:  [64-75] 73 (06/26 1500) Resp:  [8-20] 12 (06/26 1500) BP: (160-210)/(25-122) 192/38 mmHg (06/26 1600) SpO2:  [88 %-98 %] 93 % (06/26 1600) Weight:  [73.4 kg (161 lb 13.1 oz)] 73.4 kg (161 lb 13.1 oz) (06/26 0800) Last BM Date: 01/08/14  Intake/Output from previous day: 06/25 0701 - 06/26 0700 In: 955 [I.V.:235; IV Piggyback:200] Out: 1276 [Urine:1275; Stool:1] Intake/Output this shift: Total I/O In: 340 [I.V.:90; Other:150; IV Piggyback:100] Out: 900 [Urine:900]  General appearance: alert, cooperative and no distress  Lab Results:   Recent Labs  01/07/14 0243  WBC 8.9  HGB 9.1*  HCT 28.2*  PLT 277   BMET  Recent Labs  01/07/14 0243 01/08/14 0228  NA 141 141  K 4.4 4.3  CL 101 101  CO2 16* 19  GLUCOSE 92 83  BUN 77* 76*  CREATININE 4.82* 4.89*  CALCIUM 8.7 8.7   PT/INR No results found for this basename: LABPROT, INR,  in the last 72 hours ABG No results found for this basename: PHART, PCO2, PO2, HCO3,  in the last 72 hours  Studies/Results: No results found.  Anti-infectives: Anti-infectives   Start     Dose/Rate Route Frequency Ordered Stop   01/06/14 2200  clindamycin (CLEOCIN) IVPB 600 mg     600 mg 100 mL/hr over 30 Minutes Intravenous 3 times per day 01/06/14 2014     01/04/14 1400  clindamycin (CLEOCIN) IVPB 600 mg  Status:  Discontinued     600 mg 100 mL/hr over 30 Minutes Intravenous 3 times per day 01/04/14 1309 01/06/14 1710   01/04/14 1400  ceFAZolin (ANCEF) IVPB 1 g/50 mL premix     1 g 100 mL/hr over 30 Minutes Intravenous Every 24 hours 01/04/14 1309     01/03/14 2200  vancomycin (VANCOCIN) IVPB 1000 mg/200 mL premix  Status:  Discontinued     1,000 mg 200 mL/hr over 60 Minutes Intravenous Every 48 hours  01/01/14 1910 01/04/14 1309   01/03/14 1600  vancomycin (VANCOCIN) IVPB 1000 mg/200 mL premix  Status:  Discontinued     1,000 mg 200 mL/hr over 60 Minutes Intravenous Every 48 hours 01/01/14 1513 01/01/14 1903   01/01/14 2359  piperacillin-tazobactam (ZOSYN) IVPB 2.25 g  Status:  Discontinued     2.25 g 100 mL/hr over 30 Minutes Intravenous 4 times per day 01/01/14 1729 01/04/14 1309   01/01/14 1730  piperacillin-tazobactam (ZOSYN) IVPB 3.375 g  Status:  Discontinued     3.375 g 100 mL/hr over 30 Minutes Intravenous  Once 01/01/14 1729 01/04/14 1439   01/01/14 1515  vancomycin (VANCOCIN) 1,500 mg in sodium chloride 0.9 % 500 mL IVPB     1,500 mg 250 mL/hr over 120 Minutes Intravenous  Once 01/01/14 1513 01/03/14 0700   01/01/14 1445  cefTRIAXone (ROCEPHIN) 1 g in dextrose 5 % 50 mL IVPB     1 g 100 mL/hr over 30 Minutes Intravenous  Once 01/01/14 1443 01/01/14 1544      Assessment/Plan: s/p * No surgery found * Continue silvadene dressings 1 -2 x daily.  Plan for surgery next week..  LOS: 7 days    SANGER,CLAIRE 01/08/2014  
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
## 2014-01-11 NOTE — Progress Notes (Signed)
Cary, RT called RN with ABG results. pH- 7.329, CO2 57.9, 02 96.6 Bicarb 29. Respiratory recommendation to transition pt from 3L to 2L 02. Notified Claiborne Billingsallahan, NP who agreed w/ RT recs. No other orders given. Will continue to monitor pt.
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
## 2014-01-11 NOTE — Anesthesia Procedure Notes (Signed)
Procedure Name: LMA Insertion Date/Time: 01/11/2014 2:32 PM Performed by: Jefm MilesENNIE, JULIE E Pre-anesthesia Checklist: Patient identified, Emergency Drugs available, Suction available, Patient being monitored and Timeout performed Patient Re-evaluated:Patient Re-evaluated prior to inductionOxygen Delivery Method: Circle system utilized Preoxygenation: Pre-oxygenation with 100% oxygen Intubation Type: IV induction Ventilation: Mask ventilation without difficulty LMA: LMA inserted LMA Size: 4.0 Number of attempts: 1 Placement Confirmation: positive ETCO2 and breath sounds checked- equal and bilateral Tube secured with: Tape Dental Injury: Teeth and Oropharynx as per pre-operative assessment

## 2014-01-12 ENCOUNTER — Encounter (HOSPITAL_COMMUNITY): Payer: Self-pay | Admitting: Plastic Surgery

## 2014-01-12 LAB — CBC WITH DIFFERENTIAL/PLATELET
Basophils Absolute: 0.1 10*3/uL (ref 0.0–0.1)
Basophils Relative: 1 % (ref 0–1)
Eosinophils Absolute: 0.1 10*3/uL (ref 0.0–0.7)
Eosinophils Relative: 1 % (ref 0–5)
HEMATOCRIT: 23.3 % — AB (ref 36.0–46.0)
HEMOGLOBIN: 7.2 g/dL — AB (ref 12.0–15.0)
LYMPHS PCT: 14 % (ref 12–46)
Lymphs Abs: 1.4 10*3/uL (ref 0.7–4.0)
MCH: 29.4 pg (ref 26.0–34.0)
MCHC: 30.9 g/dL (ref 30.0–36.0)
MCV: 95.1 fL (ref 78.0–100.0)
MONO ABS: 0.9 10*3/uL (ref 0.1–1.0)
MONOS PCT: 8 % (ref 3–12)
NEUTROS ABS: 8 10*3/uL — AB (ref 1.7–7.7)
Neutrophils Relative %: 77 % (ref 43–77)
Platelets: 368 10*3/uL (ref 150–400)
RBC: 2.45 MIL/uL — ABNORMAL LOW (ref 3.87–5.11)
RDW: 14.9 % (ref 11.5–15.5)
WBC: 10.4 10*3/uL (ref 4.0–10.5)

## 2014-01-12 LAB — RENAL FUNCTION PANEL
Albumin: 2 g/dL — ABNORMAL LOW (ref 3.5–5.2)
BUN: 66 mg/dL — AB (ref 6–23)
CHLORIDE: 98 meq/L (ref 96–112)
CO2: 26 meq/L (ref 19–32)
Calcium: 7.7 mg/dL — ABNORMAL LOW (ref 8.4–10.5)
Creatinine, Ser: 5.16 mg/dL — ABNORMAL HIGH (ref 0.50–1.10)
GFR calc Af Amer: 8 mL/min — ABNORMAL LOW (ref >90)
GFR, EST NON AFRICAN AMERICAN: 7 mL/min — AB (ref >90)
Glucose, Bld: 69 mg/dL — ABNORMAL LOW (ref 70–99)
Phosphorus: 9.1 mg/dL — ABNORMAL HIGH (ref 2.3–4.6)
Potassium: 3.9 mEq/L (ref 3.7–5.3)
Sodium: 141 mEq/L (ref 137–147)

## 2014-01-12 LAB — GLUCOSE, CAPILLARY
GLUCOSE-CAPILLARY: 74 mg/dL (ref 70–99)
Glucose-Capillary: 109 mg/dL — ABNORMAL HIGH (ref 70–99)
Glucose-Capillary: 99 mg/dL (ref 70–99)
Glucose-Capillary: 78 mg/dL (ref 70–99)

## 2014-01-12 MED ORDER — BOOST / RESOURCE BREEZE PO LIQD
1.0000 | Freq: Two times a day (BID) | ORAL | Status: DC
  Administered 2014-01-12: 1 via ORAL

## 2014-01-12 MED ORDER — SEVELAMER CARBONATE 800 MG PO TABS
1600.0000 mg | ORAL_TABLET | Freq: Three times a day (TID) | ORAL | Status: DC
Start: 2014-01-12 — End: 2014-01-19
  Administered 2014-01-12 – 2014-01-19 (×12): 1600 mg via ORAL
  Filled 2014-01-12 (×24): qty 2

## 2014-01-12 MED ORDER — OXYCODONE HCL ER 15 MG PO T12A: 15.0000 mg | EXTENDED_RELEASE_TABLET | Freq: Two times a day (BID) | ORAL | Status: DC

## 2014-01-12 NOTE — Progress Notes (Signed)
Wasted 4ml of fentanyl with Aggie Cosierheresa, Charity fundraiserN. Unable to document in the Pyxis because it was administered in PACU.

## 2014-01-12 NOTE — Progress Notes (Signed)
Patient ID: Tricia [name], female   DOB: [birth year], 80 y.o.   MRN: [id number] S:no new complaints O:BP 166/64  Pulse 69  Temp(Src) 98.2 F (36.8 C) (Oral)  Resp 11  Ht 5\' 8"  (1.727 m)  Wt 70.7 kg (155 lb 13.8 oz)  BMI 23.70 kg/m2  SpO2 95%  Intake/Output Summary (Last 24 hours) at 01/12/14 1326 Last data filed at 01/11/14 1930  Gross per 24 hour  Intake   1050 ml  Output    500 ml  Net    550 ml   Intake/Output: I/O last 3 completed shifts: In: 1290 [P.O.:340; I.V.:950] Out: 900 [Urine:750; Blood:150]  Intake/Output this shift:    Weight change: -0.9 kg (-1 lb 15.8 oz) Gen:WD chronically ill-appearing WF in no distress CVS:no rub Resp:scattered rhonchi WUJ:WJXBJYAbd:benign Ext:s/p right AKA, no edema   Recent Labs Lab 01/06/14 0311 01/07/14 0243 01/08/14 0228 01/09/14 0455 01/10/14 0555 01/11/14 0544 01/12/14 0520  NA 139 141 141 143  142 140 141 141  K 4.8 4.4 4.3 3.9  3.8 3.7 3.9 3.9  CL 101 101 101 100  100 95* 98 98  CO2 16* 16* 19 23  22 25 27 26   GLUCOSE 104* 92 83 77  76 84 98 69*  BUN 73* 77* 76* 71*  72* 66* 66* 66*  CREATININE 4.81* 4.82* 4.89* 4.84*  4.77* 4.61* 4.84* 5.16*  ALBUMIN 2.0* 2.1* 2.1* 2.1*  2.1* 2.3* 2.1* 2.0*  CALCIUM 8.7 8.7 8.7 8.3*  8.4 8.6 8.3* 7.7*  PHOS 6.3* 6.9* 7.2* 7.3*  7.3* 7.7* 9.3* 9.1*   Liver Function Tests:  Recent Labs Lab 01/10/14 0555 01/11/14 0544 01/12/14 0520  ALBUMIN 2.3* 2.1* 2.0*   No results found for this basename: LIPASE, AMYLASE,  in the last 168 hours No results found for this basename: AMMONIA,  in the last 168 hours CBC:  Recent Labs Lab 01/07/14 0243 01/09/14 0455 01/11/14 0544 01/12/14 0520  WBC 8.9 12.5* 10.6* 10.4  NEUTROABS 6.4 9.8* 8.8* 8.0*  HGB 9.1* 8.9* 9.0* 7.2*  HCT 28.2* 27.6* 28.8* 23.3*  MCV 91.3 91.4 96.3 95.1  PLT 277 362 351 368   Cardiac Enzymes: No results found for this basename: CKTOTAL, CKMB, CKMBINDEX, TROPONINI,  in the last 168 hours CBG:  Recent  Labs Lab 01/11/14 1558 01/11/14 1958 01/11/14 2115 01/12/14 0812 01/12/14 1154  GLUCAP 94 97 110* 74 109*    Iron Studies: No results found for this basename: IRON, TIBC, TRANSFERRIN, FERRITIN,  in the last 72 hours Studies/Results: No results found. Marland Kitchen. amLODipine  10 mg Oral Daily  . carvedilol  25 mg Oral BID WC  .  ceFAZolin (ANCEF) IV  1 g Intravenous Q24H  . darbepoetin (ARANESP) injection - NON-DIALYSIS  100 mcg Subcutaneous Q Tue-1800  . docusate sodium  100 mg Oral BID  . feeding supplement (ENSURE)  1 Container Oral TID BM  . feeding supplement (RESOURCE BREEZE)  1 Container Oral BID BM  . fentaNYL   Intravenous 6 times per day  . furosemide  80 mg Intravenous Q12H  . heparin  5,000 Units Subcutaneous 3 times per day  . hydrALAZINE  200 mg Oral TID  . insulin aspart  0-15 Units Subcutaneous TID WC  . insulin aspart  0-5 Units Subcutaneous QHS  . promethazine  12.5 mg Intravenous Once  . silver sulfADIAZINE   Topical BID  . sodium bicarbonate  650 mg Oral BID  . sodium chloride  3 mL Intravenous Q12H  BMET    Component Value Date/Time   NA 141 01/12/2014 0520   K 3.9 01/12/2014 0520   CL 98 01/12/2014 0520   CO2 26 01/12/2014 0520   GLUCOSE 69* 01/12/2014 0520   BUN 66* 01/12/2014 0520   CREATININE 5.16* 01/12/2014 0520   CALCIUM 7.7* 01/12/2014 0520   GFRNONAA 7* 01/12/2014 0520   GFRAA 8* 01/12/2014 0520   CBC    Component Value Date/Time   WBC 10.4 01/12/2014 0520   RBC 2.45* 01/12/2014 0520   HGB 7.2* 01/12/2014 0520   HCT 23.3* 01/12/2014 0520   PLT 368 01/12/2014 0520   MCV 95.1 01/12/2014 0520   MCH 29.4 01/12/2014 0520   MCHC 30.9 01/12/2014 0520   RDW 14.9 01/12/2014 0520   LYMPHSABS 1.4 01/12/2014 0520   MONOABS 0.9 01/12/2014 0520   EOSABS 0.1 01/12/2014 0520   BASOSABS 0.1 01/12/2014 0520     Assessment/Plan:  1. AKI/CKD in setting of cellulitis and bacteremia. Non-oliguric. Scr up to 5.16 over the last 24 hours. She has not been interested in RRT as an  outpt but when I discussed hospice if her renal function would continue to deteriorate she then said she would do dialysis temporarily if indicated.  She may need TDC placed tomorrow if her renal function continues to worsen. 2. Cellulitis/strep bacteremia- s/p surgical debridement per plastics 3. HTN- still elevated on hydralazine and norvasc as well as coreg. No ACE/ARB due to advanced CKD.  Would consider adding clonidine 0.1 mg bid as HR and BP tolerates 4. Anemia- on aranesp and now with ABLA on ACDz.  Transfuse prn. 5. Metabolic acidosis- improved with po bicarb 6. PVD 7. SHPTH- start renvela 1600 qac tid. 8. Dispo- still with intermittent confusion related to narcotics. Weak, may need SNF vs. HHC at discharge.  9.  COLADONATO,JOSEPH A

## 2014-01-12 NOTE — Progress Notes (Signed)
Waste 5ml in sink with Warehouse managerLonnie Whitiker RN.

## 2014-01-12 NOTE — Progress Notes (Signed)
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  She presented with complaints of left upper extremity pain, swelling, erythema, having an associated functional decline, generalized weakness, feeling ill. Patient found to have severe left upper extremity cellulitis, with initial lab work showing white count of 3000 with presence of bands, temperature 101.1, development of acute on chronic renal failure. Given severity of presentation a CT scan of her left upper extremity was obtained in the emergency room which revealed severe diffuse edematous changes consistent with severe cellulitis. She was initially started on broad-spectrum IV antibiotic therapy with vancomycin and Zosyn. General surgery was consulted.  She showed little improvement over the following days, having further deterioration to kidney function as well as becoming more encephalopathic. Blood cultures obtained on 01/01/2014, back positive for group A streptococcus in both sets. Repeat blood cultures were obtained. General surgery recommended orthopedic surgery consultation.  On 01/04/2014 patient was seen and evaluated by Dr. Luiz Blare of orthopedic surgery. He recommended ongoing IV antimicrobial therapy and did not feel surgical intervention was warranted at this time. Dr. Drue Second of infectious disease was consulted and recommended the discontinuation of vancomycin and Zosyn and starting Clindamycin and Ancef. With patient's deterioration to kidney function Dr. Kathrene Bongo of nephrology was consulted as well. Though initially we had planned on re-imaging her arm, speaking with Dr. Luiz Blare, he did  not recommend pursuing MRI.  Plastic surgery consult placed 6/25 and kept NPO for potenttial surgery                                                                             Assessment/Plan: 1. Sepsis -Present on admission, evidence by leukopenia  3000, presence of bands, temperature of 101.1, respiratory rate of 27, having positive blood cultures--Microbiology reporting that blood cultures positive for Group A Strep x 2 sets  Repeat blood cultures drawn on 01/02/2014 showing no growth.   Note Urine also + for >100,000 Staph -Source of infection likely to be left upper extremity cellulitis.  -ID consulted recommended  to Clindamycin and Ancef on 6/22 -WOC nurse asked to assess area-changed dressings to silvadene -Plastic surgery Dr. Kelly Splinter consulted-had debridement 01/11/14 -Pain if still elevated had maybe 7/10 even on PCA so tomorrow morning we'll need to calculate overall dose of medications used and work out pain and needs on discharge-I have consulted them to get a starting dose to work with  2.  Left upper extremity cellulitis -CT scan of left upper extremity showing severe diffuse changes consistent with cellulitis.  -Will continue IV AB coverage, now on Clindamycin and Ancef -Wound care, General Surgery, plastic surgery and Orthopedics consulted -Blood cultures growing Group A Strep -prealbumin 8.4 indicating poor protein reserves-defer to surgery  3.  Acute on Chronic Renal Failure -Patient with history of stage IV CKD, baseline creatinine near 3.4.  -AM lab work showing  Worsening creatinine of 66/5.16 on 01/12/14 -is undecided about need for dialysis-will defer discussions to  nephrology -likely worsened by IV contrast 01/01/14 -Placed on bicard gtt, now BMP showing bicarb of 19. bicarb d/c 6/26 -Dr Kathrene BongoGoldsborough of Nephrology input appreciated  -unlcear if would want dialysis  4. Chronic diastolic CHF -Compensated.  -Diuretics stopped on admission.  -Follow volume  status -lasix started by nephrology on currently 80 mg iv bid -still net + ~7 liters  5.  Metabolic Acidosis -Bicab completely resolved  6. Staphylococcal pyelonephritis -On broad spectrum AB therapy  7. Type 2 diabetes Mellitus -Continue accuchecks ac and hs -sugars in 70-130's  8. Toxic metabolic encephalopathy probably 2/2 to uremia/sepsis -much improved compared to yesterday -monitor for delirium  9. HTn -poor control -clonidine added per renal 6/28 0.1 mg for pressure above 170 contue hydralazine 150 tid-increased 6/29 to 200 tid, coreg 12.5 bid increased to 25 bid, amlodipine 10  Code Status: Full Family Communication: updated at bedside Disposition Plan: Continue IV fluids, IV AB's   Consultants:  Surger   Plastic surgery  Nephrology  ortho   HPI/Subjective:  More alert Renal function noted to decline a little Tol po somewhat pain not well controlled No stool No fever No chills   Objective: Filed Vitals:   01/12/14 1200  BP:   Pulse:   Temp:   Resp: 11    Intake/Output Summary (Last 24 hours) at 01/12/14 1323 Last data filed at 01/11/14 1930  Gross per 24 hour  Intake   1050 ml  Output    500 ml  Net    550 ml   Filed Weights   01/09/14 0436 01/11/14 0500 01/12/14 0633  Weight: 72.6 kg (160 lb 0.9 oz) 71.6 kg (157 lb 13.6 oz) 70.7 kg (155 lb 13.8 oz)    Exam:   General:  Patient is lethargic, confused, can not follow commands  Cardiovascular: Regular rate and rhythm, normal S1S2  Respiratory: Clear to auscultation, normal inspiratory effort  Abdomen: Soft nontender nondistended   Data Reviewed: Basic Metabolic Panel:  Recent Labs Lab 01/08/14 0228 01/09/14 0455 01/10/14 0555 01/11/14 0544 01/12/14 0520  NA 141 143  142 140 141 141  K 4.3 3.9  3.8 3.7 3.9 3.9  CL 101 100  100 95* 98 98  CO2 19 23  22 25 27 26   GLUCOSE 83 77  76 84 98 69*  BUN 76* 71*  72* 66* 66* 66*  CREATININE 4.89* 4.84*  4.77* 4.61*  4.84* 5.16*  CALCIUM 8.7 8.3*  8.4 8.6 8.3* 7.7*  PHOS 7.2* 7.3*  7.3* 7.7* 9.3* 9.1*   Liver Function Tests:  Recent Labs Lab 01/08/14 0228 01/09/14 0455 01/10/14 0555 01/11/14 0544 01/12/14 0520  ALBUMIN 2.1* 2.1*  2.1* 2.3* 2.1* 2.0*   No results found for this basename: LIPASE, AMYLASE,  in the last 168 hours No results found for this basename: AMMONIA,  in the last 168 hours CBC:  Recent Labs Lab 01/07/14 0243 01/09/14 0455 01/11/14 0544 01/12/14 0520  WBC 8.9 12.5* 10.6* 10.4  NEUTROABS 6.4 9.8* 8.8* 8.0*  HGB 9.1* 8.9* 9.0* 7.2*  HCT 28.2* 27.6* 28.8* 23.3*  MCV 91.3 91.4 96.3 95.1  PLT 277 362 351 368   Cardiac Enzymes: No results found for this basename: CKTOTAL, CKMB, CKMBINDEX, TROPONINI,  in the last 168 hours BNP (last 3 results)  Recent Labs  07/28/13 1650  PROBNP 32203.0*   CBG:  Recent Labs Lab 01/11/14 1558 01/11/14 1958 01/11/14 2115 01/12/14 0812 01/12/14 1154  GLUCAP 94 97 110* 74 109*  Recent Results (from the past 240 hour(s))  CULTURE, BLOOD (ROUTINE X 2)     Status: None   Collection Time    01/02/14  2:25 PM      Result Value Ref Range Status   Specimen Description BLOOD RIGHT HAND   Final   Special Requests BOTTLES DRAWN AEROBIC ONLY 5CC   Final   Culture  Setup Time     Final   Value: 01/02/2014 22:28     Performed at [facility]   Culture     Final   Value: NO GROWTH 5 DAYS     Performed at [facility]   Report Status 01/08/2014 FINAL   Final  CULTURE, BLOOD (ROUTINE X 2)     Status: None   Collection Time    01/02/14  2:40 PM      Result Value Ref Range Status   Specimen Description BLOOD RIGHT ARM   Final   Special Requests BOTTLES DRAWN AEROBIC AND ANAEROBIC 10CC EACH   Final   Culture  Setup Time     Final   Value: 01/02/2014 22:28     Performed at [facility]   Culture     Final   Value: NO GROWTH 5 DAYS     Performed at [facility]   Report Status 01/08/2014 FINAL    Final     Studies: No results found.  Scheduled Meds: . amLODipine  10 mg Oral Daily  . carvedilol  25 mg Oral BID WC  .  ceFAZolin (ANCEF) IV  1 g Intravenous Q24H  . darbepoetin (ARANESP) injection - NON-DIALYSIS  100 mcg Subcutaneous Q Tue-1800  . docusate sodium  100 mg Oral BID  . feeding supplement (ENSURE)  1 Container Oral TID BM  . feeding supplement (RESOURCE BREEZE)  1 Container Oral BID BM  . fentaNYL   Intravenous 6 times per day  . furosemide  80 mg Intravenous Q12H  . heparin  5,000 Units Subcutaneous 3 times per day  . hydrALAZINE  200 mg Oral TID  . insulin aspart  0-15 Units Subcutaneous TID WC  . insulin aspart  0-5 Units Subcutaneous QHS  . promethazine  12.5 mg Intravenous Once  . silver sulfADIAZINE   Topical BID  . sodium bicarbonate  650 mg Oral BID  . sodium chloride  3 mL Intravenous Q12H   Continuous Infusions: . sodium chloride 25 mL/hr (01/11/14 1355)    Principal Problem:   Cellulitis Active Problems:   Hypertensive heart disease   Tobacco dependency   Chronic diastolic CHF (congestive heart failure)   Sepsis   Acute on chronic renal failure   Diabetes mellitus with peripheral vascular disease   Arm wound    Time spent: 25 min  Pleas KochJai Kenni Newton, MD [facility] (P) [phone]

## 2014-01-13 DIAGNOSIS — R7881 Bacteremia: Secondary | ICD-10-CM

## 2014-01-13 DIAGNOSIS — A491 Streptococcal infection, unspecified site: Secondary | ICD-10-CM

## 2014-01-13 DIAGNOSIS — B955 Unspecified streptococcus as the cause of diseases classified elsewhere: Secondary | ICD-10-CM | POA: Diagnosis present

## 2014-01-13 DIAGNOSIS — N390 Urinary tract infection, site not specified: Secondary | ICD-10-CM | POA: Diagnosis present

## 2014-01-13 LAB — GLUCOSE, CAPILLARY
GLUCOSE-CAPILLARY: 85 mg/dL (ref 70–99)
GLUCOSE-CAPILLARY: 86 mg/dL (ref 70–99)
GLUCOSE-CAPILLARY: 86 mg/dL (ref 70–99)
GLUCOSE-CAPILLARY: 70 mg/dL (ref 70–99)

## 2014-01-13 MED ORDER — OXYCODONE HCL 5 MG PO TABS
5.0000 mg | ORAL_TABLET | ORAL | Status: DC | PRN
  Administered 2014-01-13 – 2014-01-18 (×2): 5 mg via ORAL
  Filled 2014-01-13 (×3): qty 1

## 2014-01-13 MED ORDER — MORPHINE SULFATE 2 MG/ML IJ SOLN
2.0000 mg | INTRAMUSCULAR | Status: DC | PRN
  Administered 2014-01-14 – 2014-01-15 (×3): 2 mg via INTRAVENOUS
  Filled 2014-01-13 (×3): qty 1

## 2014-01-13 MED ORDER — INSULIN ASPART 100 UNIT/ML ~~LOC~~ SOLN
0.0000 [IU] | Freq: Three times a day (TID) | SUBCUTANEOUS | Status: DC
  Administered 2014-01-15: 1 [IU] via SUBCUTANEOUS

## 2014-01-13 NOTE — Progress Notes (Signed)
Chaplain Note: Chaplain followed up with patient after a morning visit. Son was present, at bedside. Patient was awake and alert, responding to brief moments of physical pain with a small grimace. Son explained that she had been taken off of a previous pain medication. Patient's overall physical presentation showed more color in her skin and eyes (a positive update from the morning visit). Patient's son expressed that he was and is currently his mother's POA and had taken on most of the responsibility for her care. Patient mentioned that it had been quite stressful, especially after having his own medical attention. Chaplain talked with patient about outside support. Son mentioned having briefly left a church home to care for his mother. He says that him and his mother had discussed getting the family back together to maybe take that step together. Son mentioned a brother and niece that ad been by to see the patient. Chaplain encouraged patient to seek more family support for shared responsibility. Chaplain also encouraged son to pay closer attention to his medical, spiritual and emotional needs (self-care). He agreed it may be best and seemed to have others in mind who could lighten the load. Possible follow up with patient if she is moved to palliative care.     01/13/14 1600  Clinical Encounter Type  Visited With Patient;Family  Visit Type Follow-up;Spiritual support;Social support  Referral From Patient  Consult/Referral To Chaplain  Recommendations (Follow up with patient in palliative care)  Spiritual Encounters  Spiritual Needs Prayer;Emotional  Stress Factors  Patient Stress Factors None identified  Family Stress Factors Exhausted;Health changes (Son reports being his mother's POA. Extended responsibility.)   Chaplain: Toni Amendndria Williamson

## 2014-01-13 NOTE — Progress Notes (Signed)
NUTRITION FOLLOW-UP/CONSULT  DOCUMENTATION CODES Per approved criteria  -Not Applicable   INTERVENTION: Discontinue oral nutrition supplements - pt is refusing all of them presently. If aggressive nutrition therapy desired, recommend initiation of short-term nutrition support as pt is refusing all supplements and eating <25% of meals. RD to continue to follow nutrition care plan.  NUTRITION DIAGNOSIS: Inadequate oral intake related to poor appetite as evidenced by son report - ongoing.  Goal: Pt to meet >/= 90% of their estimated nutrition needs - unmet.  Monitor:  PO & supplemental intake, weight, labs, I/O's, GOC  ASSESSMENT: 80 y.o. female with a history of multiple comorbidities including DM, peripheral vascular disease, status post right AKA, stage IV CKD, chronic diastolic CHF; presented to the ER with complaints of generalized weakness, feeling poorly and left upper extremity swelling.   Pt with severe cellulitis of L arm, underwent I&D of arm 6/29 with application of wound VAC.  Renal continues to follow patient. Per MD, she remains ambivalent about HD and given her overall decline and poor QOL, MD suspects that pt needs palliative care evaluation, which is pending at this time.  Per son at bedside, pt continues with very poor oral intake. Has tried all of the supplements that she's willing to try and doesn't want any more of them. She is asking for an orange, RD provided her with one and as well as some other snacks. She is refusing to eat pretty much anything at this point, per family.  Prealbumin is low at 8.4 Phosphorus elevated at 9.0 --> ordered for renvela TID  Height: Ht Readings from Last 1 Encounters:  01/01/14 5\' 8"  (1.727 m)    Weight: Wt Readings from Last 1 Encounters:  01/13/14 160 lb 0.9 oz (72.6 kg)   BMI: 27.4 kg/m2 -- adjusted for AKA  Estimated Nutritional Needs: Kcal: 1800-2000 Protein: 80-90 gm Fluid: per MD  Skin:  L arm swelling  Diet  Order: Carb Control   Intake/Output Summary (Last 24 hours) at 01/13/14 1423 Last data filed at 01/13/14 0900  Gross per 24 hour  Intake      0 ml  Output   1400 ml  Net  -1400 ml    Last BM: 6/29  Labs:   Recent Labs Lab 01/10/14 0555 01/11/14 0544 01/12/14 0520  NA 140 141 141  K 3.7 3.9 3.9  CL 95* 98 98  CO2 25 27 26   BUN 66* 66* 66*  CREATININE 4.61* 4.84* 5.16*  CALCIUM 8.6 8.3* 7.7*  PHOS 7.7* 9.3* 9.1*  GLUCOSE 84 98 69*    CBG (last 3)   Recent Labs  01/12/14 2202 01/13/14 0800 01/13/14 1148  GLUCAP 78 85 86   Prealbumin  Date/Time Value Ref Range Status  01/07/2014  2:05 PM 8.4* 17.0 - 34.0 mg/dL Final     Performed at [facility]     Scheduled Meds: . amLODipine  10 mg Oral Daily  . carvedilol  25 mg Oral BID WC  .  ceFAZolin (ANCEF) IV  1 g Intravenous Q24H  . darbepoetin (ARANESP) injection - NON-DIALYSIS  100 mcg Subcutaneous Q Tue-1800  . docusate sodium  100 mg Oral BID  . feeding supplement (ENSURE)  1 Container Oral TID BM  . feeding supplement (RESOURCE BREEZE)  1 Container Oral BID BM  . furosemide  80 mg Intravenous Q12H  . heparin  5,000 Units Subcutaneous 3 times per day  . hydrALAZINE  200 mg Oral TID  . insulin aspart  0-9 Units Subcutaneous TID WC  . promethazine  12.5 mg Intravenous Once  . sevelamer carbonate  1,600 mg Oral TID WC  . silver sulfADIAZINE   Topical BID  . sodium bicarbonate  650 mg Oral BID  . sodium chloride  3 mL Intravenous Q12H    Continuous Infusions: . sodium chloride 25 mL/hr (01/11/14 1355)    Tricia MottoSamantha Azalya Galyon MS, RD, LDN Inpatient Registered Dietitian Pager: [phone] After-hours pager: [phone]

## 2014-01-13 NOTE — Progress Notes (Signed)
Pts BP elevated. On call NP notified, no new orders given.

## 2014-01-13 NOTE — Progress Notes (Signed)
PT Cancellation Note  Patient Details Name: Tricia BeathMargie E [name] MRN: [id number] DOB: 10-Feb-1934   Cancelled Treatment:    Reason Eval/Treat Not Completed: Fatigue/lethargy limiting ability to participate (pt reports feeling very tired and refused any type of eval at this time despite max education and encouragement)   [name], [name] 01/13/2014, 9:21 AM Delaney MeigsMaija Tabor Aaralynn Shepheard, PT [phone]

## 2014-01-13 NOTE — Progress Notes (Signed)
Orders received to stop PCA pump. 49 ml of Fentanyl wasted in sink with Ginger Gleason RN

## 2014-01-13 NOTE — Progress Notes (Signed)
Chaplain Note:  Chaplain responded to spiritual consult request. HCP reports meeting with patient prior to Chaplain visit. Patient was beginning to fall asleep when chaplain entered the room. Chaplain stay with patient, offering a brief music therapy session, followed by prayer. Other than a more relaxed breathing pattern, patient showed no other responsive signs to the session. She did, however, nonverbally respond to a staff notification bell, which rang over the PA.  HCP mentioned that the patient's son as her current POA. Patient may need assistance with a living will in the near future, as she is currently a Code:FULL, and is being considered for a move to palliative care. Follow-up needed with patient and family.  Markus JarvisAndria, [place]Chaplain

## 2014-01-13 NOTE — Progress Notes (Signed)
Patient ID: Tricia [name], female   DOB: 09/10/1933, 80 y.o.   MRN: [id number] S:"don't feel right" O:BP 178/53  Pulse 72  Temp(Src) 97.9 F (36.6 C) (Oral)  Resp 10  Ht 5\' 8"  (1.727 m)  Wt 72.6 kg (160 lb 0.9 oz)  BMI 24.34 kg/m2  SpO2 95%  Intake/Output Summary (Last 24 hours) at 01/13/14 [id number] Last data filed at 01/13/14 0900  Gross per 24 hour  Intake    120 ml  Output   1400 ml  Net  -1280 ml   Intake/Output: I/O last 3 completed shifts: In: 370 [P.O.:220; I.V.:150] Out: 1200 [Urine:1200]  Intake/Output this shift:  Total I/O In: 0  Out: 250 [Urine:250] Weight change: 1.9 kg (4 lb 3 oz) JXB:JYNWGNFAOGen:lethargic and mildly confused CVS:no rub Resp:cta ZHY:QMVHQIAbd:benign Ext:s/p R AKA, no edema, left arm wrapped with wound vac   Recent Labs Lab 01/07/14 0243 01/08/14 0228 01/09/14 0455 01/10/14 0555 01/11/14 0544 01/12/14 0520  NA 141 141 143  142 140 141 141  K 4.4 4.3 3.9  3.8 3.7 3.9 3.9  CL 101 101 100  100 95* 98 98  CO2 16* 19 23  22 25 27 26   GLUCOSE 92 83 77  76 84 98 69*  BUN 77* 76* 71*  72* 66* 66* 66*  CREATININE 4.82* 4.89* 4.84*  4.77* 4.61* 4.84* 5.16*  ALBUMIN 2.1* 2.1* 2.1*  2.1* 2.3* 2.1* 2.0*  CALCIUM 8.7 8.7 8.3*  8.4 8.6 8.3* 7.7*  PHOS 6.9* 7.2* 7.3*  7.3* 7.7* 9.3* 9.1*   Liver Function Tests:  Recent Labs Lab 01/10/14 0555 01/11/14 0544 01/12/14 0520  ALBUMIN 2.3* 2.1* 2.0*   No results found for this basename: LIPASE, AMYLASE,  in the last 168 hours No results found for this basename: AMMONIA,  in the last 168 hours CBC:  Recent Labs Lab 01/07/14 0243 01/09/14 0455 01/11/14 0544 01/12/14 0520  WBC 8.9 12.5* 10.6* 10.4  NEUTROABS 6.4 9.8* 8.8* 8.0*  HGB 9.1* 8.9* 9.0* 7.2*  HCT 28.2* 27.6* 28.8* 23.3*  MCV 91.3 91.4 96.3 95.1  PLT 277 362 351 368   Cardiac Enzymes: No results found for this basename: CKTOTAL, CKMB, CKMBINDEX, TROPONINI,  in the last 168 hours CBG:  Recent Labs Lab 01/12/14 0812 01/12/14 1154  01/12/14 1655 01/12/14 2202 01/13/14 0800  GLUCAP 74 109* 99 78 85    Iron Studies: No results found for this basename: IRON, TIBC, TRANSFERRIN, FERRITIN,  in the last 72 hours Studies/Results: No results found. Marland Kitchen. amLODipine  10 mg Oral Daily  . carvedilol  25 mg Oral BID WC  .  ceFAZolin (ANCEF) IV  1 g Intravenous Q24H  . darbepoetin (ARANESP) injection - NON-DIALYSIS  100 mcg Subcutaneous Q Tue-1800  . docusate sodium  100 mg Oral BID  . feeding supplement (ENSURE)  1 Container Oral TID BM  . feeding supplement (RESOURCE BREEZE)  1 Container Oral BID BM  . fentaNYL   Intravenous 6 times per day  . furosemide  80 mg Intravenous Q12H  . heparin  5,000 Units Subcutaneous 3 times per day  . hydrALAZINE  200 mg Oral TID  . insulin aspart  0-15 Units Subcutaneous TID WC  . promethazine  12.5 mg Intravenous Once  . sevelamer carbonate  1,600 mg Oral TID WC  . silver sulfADIAZINE   Topical BID  . sodium bicarbonate  650 mg Oral BID  . sodium chloride  3 mL Intravenous Q12H    BMET  Component Value Date/Time   NA 141 01/12/2014 0520   K 3.9 01/12/2014 0520   CL 98 01/12/2014 0520   CO2 26 01/12/2014 0520   GLUCOSE 69* 01/12/2014 0520   BUN 66* 01/12/2014 0520   CREATININE 5.16* 01/12/2014 0520   CALCIUM 7.7* 01/12/2014 0520   GFRNONAA 7* 01/12/2014 0520   GFRAA 8* 01/12/2014 0520   CBC    Component Value Date/Time   WBC 10.4 01/12/2014 0520   RBC 2.45* 01/12/2014 0520   HGB 7.2* 01/12/2014 0520   HCT 23.3* 01/12/2014 0520   PLT 368 01/12/2014 0520   MCV 95.1 01/12/2014 0520   MCH 29.4 01/12/2014 0520   MCHC 30.9 01/12/2014 0520   RDW 14.9 01/12/2014 0520   LYMPHSABS 1.4 01/12/2014 0520   MONOABS 0.9 01/12/2014 0520   EOSABS 0.1 01/12/2014 0520   BASOSABS 0.1 01/12/2014 0520     Assessment/Plan:  1. AKI/CKD in setting of cellulitis and bacteremia. Non-oliguric. Scr up to 5.16 over the last 24 hours. She has not been interested in RRT as an outpt but when I discussed hospice if her  renal function would continue to deteriorate she then said she would do dialysis temporarily if indicated. She may need TDC placed tomorrow if her renal function continues to worsen. 1. Unfortunately standing orders were discontinued and no labs drawn this am, will check now and follow. 2. She remains ambivalent about dialysis and given her overall decline and poor QOL, I feel it is time to get palliative care on board to set goals/limits of care and to help guide decision for dialysis or CMO (she has expressed her wishes not to pursue dialysis in the past but waffled yesterday and is delirious this am.  I have also spoken with her son who was also aware of her wishes not to proceed with dialysis) 2. Cellulitis/strep bacteremia- s/p surgical debridement per plastics 3. HTN- still elevated on hydralazine and norvasc as well as coreg. No ACE/ARB due to advanced CKD.  1. Would recommend adding clonidine 0.1 mg bid as HR and BP tolerates 4. Anemia- on aranesp and now with ABLA on ACDz. Transfuse prn. 5. Metabolic acidosis- improved with po bicarb 6. PVD 7. SHPTH- start renvela 1600 qac tid. 8. Dispo- still with intermittent confusion related to narcotics. Weak, may need SNF vs. HHC at discharge.  Await palliative care input as CMO may be in her best interest given her decline in function and overall health. 9.   Janiece Scovill A

## 2014-01-13 NOTE — Clinical Social Work Note (Signed)
CSW attempted to assess patient today. Patient not able to contribute to assessment. CSW attempted to call son, but number is disconnected. CSW will re-attempt tomorrow.  Roddie McBryant Faylene Allerton MSW, [place]CSWA, [place], [phone]

## 2014-01-13 NOTE — Progress Notes (Signed)
[facility] PROGRESS NOTE  Tricia [name] XLK:[id number] DOB: Feb 05, 1934 DOA: 01/01/2014 PCP: Kaleen Mask, MD  Interim Summary  80 year old ?, known ty 2 diabetes mellitus, peripheral vascular disease, 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  She presented with complaints of left upper extremity pain, swelling, erythema, having an associated functional decline, generalized weakness, feeling ill. Patient found to have severe left upper extremity cellulitis, with initial lab work showing white count of 3000 with presence of bands, temperature 101.1, development of acute on chronic renal failure. Given severity of presentation a CT scan of her left upper extremity was obtained in the emergency room which revealed severe diffuse edematous changes consistent with severe cellulitis. She was initially started on broad-spectrum IV antibiotic therapy with vancomycin and Zosyn. General surgery was consulted.  She showed little improvement over the following days, having further deterioration to kidney function as well as becoming more encephalopathic. Blood cultures obtained on 01/01/2014, back positive for group A streptococcus in both sets. Repeat blood cultures were obtained and show no growth to date. General surgery recommended orthopedic surgery consultation.  On 01/04/2014 patient was seen and evaluated by Dr. Luiz Blare of orthopedic surgery. He recommended ongoing IV antimicrobial therapy and did not feel surgical intervention was warranted at this time. Dr. Drue Second of infectious disease was consulted and recommended the discontinuation of vancomycin and Zosyn and starting Clindamycin and Ancef. With patient's deterioration to kidney function Dr. Kathrene Bongo of nephrology was consulted as well. The patient's kidney function has continued to decrease and  hemodialysis has been discussed.  The patient does not want HD and Nephrology does not feel she is a good candidate.  Plastic surgery consult placed 6/25 and Dr. Kelly Splinter performed excision and debridement of the left arm with VAC placement on 6/29.  Consideration is being given to taking the patient back to the OR on July 6th..                                                                             Assessment/Plan: Sepsis secondary to Group A strep Blood cultures positive for Group A Strep x 2 sets  Repeat blood cultures drawn on 01/02/2014 showing no growth.   Note Urine also + for >100,000 Staph (pan sensitive) Source of infection likely to be left upper extremity cellulitis.  ID consulted recommended  to Clindamycin and Ancef on 6/22  Left upper extremity cellulitis CT scan of left upper extremity showing severe diffuse changes consistent with cellulitis.  Will continue IV AB coverage, now on Clindamycin and Ancef per ID Wound care, General Surgery, plastic surgery and Orthopedics consulted Plastic surgery Dr. Kelly Splinter consulted-had debridement 01/11/14.  Wound vac placed.  ?Return to OR on 7/6. prealbumin 8.4 indicating poor protein reserves-nutrition consultation.  Acute on Chronic Renal Failure Patient with history of stage IV CKD, baseline creatinine near 3.4.  Patient does not want HD.  Nephrology believes she would be a poor HD candidate. Placed on bicarb gtt, now BMP showing bicarb of 19. bicarb d/c 6/26 Nephrology recommends Palliative consultation for goals of care.  Chronic diastolic CHF Compensated.  Diuretics stopped  on admission.  lasix started by nephrology on currently 80 mg iv bid still net + ~6 liters On Coreg.  No Ace-I due to ARF. Strict I&Os  Metabolic Acidosis Bicab completely resolved  Staphylococcal pyelonephritis On Ancef AB therapy until 7/4.  Change to Keflex when she is taking oral medications well.  Type 2 diabetes Mellitus CBGs in 70-100. Decrease  insulin to SSI - Sensitive.  Toxic metabolic encephalopathy probably 2/2 to narcotic use and uremia. Discontinued Fentanyl PCA.  Started PRN Oxy IR and Morphine.  Son appreciates decreased pain medications as he believes this is causing her AMS on 7/1.  HTN Moderate control clonidine added per renal 6/28 0.1 mg for pressure above 170 contue hydralazine 150 tid-increased 6/29 to 200 tid, coreg 12.5 bid increased to 25 bid, amlodipine 10  Code Status: Full Family Communication: Called Son, Tricia [name].  Discussed Palliative Medicine consultation.  He agrees to GOC discussion. Disposition Plan: inpatient.   Consultants:  General Surgery  Plastic surgery  Nephrology  ortho   HPI/Subjective: Altered / Confused on 7/1.     Objective: Filed Vitals:   01/13/14 1339  BP: 153/58  Pulse: 77  Temp: 97.8 F (36.6 C)  Resp: 18    Intake/Output Summary (Last 24 hours) at 01/13/14 1422 Last data filed at 01/13/14 0900  Gross per 24 hour  Intake      0 ml  Output   1400 ml  Net  -1400 ml   Filed Weights   01/11/14 0500 01/12/14 0633 01/13/14 0356  Weight: 71.6 kg (157 lb 13.6 oz) 70.7 kg (155 lb 13.8 oz) 72.6 kg (160 lb 0.9 oz)    Exam:   General:  Elderly, chronically ill appearing. Awake but lethargic, confused  cardiovascular: Regular rate and rhythm, normal S1S2  Respiratory: coarse breath sounds.  No increased work of breathing.  Abdomen: Soft nontender nondistended, +BS, no masses  Extremities:  No edema.  LLE amputation.   Data Reviewed: Basic Metabolic Panel:  Recent Labs Lab 01/08/14 0228 01/09/14 0455 01/10/14 0555 01/11/14 0544 01/12/14 0520  NA 141 143  142 140 141 141  K 4.3 3.9  3.8 3.7 3.9 3.9  CL 101 100  100 95* 98 98  CO2 19 23  22 25 27 26   GLUCOSE 83 77  76 84 98 69*  BUN 76* 71*  72* 66* 66* 66*  CREATININE 4.89* 4.84*  4.77* 4.61* 4.84* 5.16*  CALCIUM 8.7 8.3*  8.4 8.6 8.3* 7.7*  PHOS 7.2* 7.3*  7.3* 7.7* 9.3* 9.1*   Liver  Function Tests:  Recent Labs Lab 01/08/14 0228 01/09/14 0455 01/10/14 0555 01/11/14 0544 01/12/14 0520  ALBUMIN 2.1* 2.1*  2.1* 2.3* 2.1* 2.0*   CBC:  Recent Labs Lab 01/07/14 0243 01/09/14 0455 01/11/14 0544 01/12/14 0520  WBC 8.9 12.5* 10.6* 10.4  NEUTROABS 6.4 9.8* 8.8* 8.0*  HGB 9.1* 8.9* 9.0* 7.2*  HCT 28.2* 27.6* 28.8* 23.3*  MCV 91.3 91.4 96.3 95.1  PLT 277 362 351 368   BNP (last 3 results)  Recent Labs  07/28/13 1650  PROBNP 32203.0*   CBG:  Recent Labs Lab 01/12/14 1154 01/12/14 1655 01/12/14 2202 01/13/14 0800 01/13/14 1148  GLUCAP 109* 99 78 85 86     Studies: No results found.  Scheduled Meds: . amLODipine  10 mg Oral Daily  . carvedilol  25 mg Oral BID WC  .  ceFAZolin (ANCEF) IV  1 g Intravenous Q24H  . darbepoetin (ARANESP) injection - NON-DIALYSIS  100 mcg Subcutaneous Q Tue-1800  . docusate sodium  100 mg Oral BID  . feeding supplement (ENSURE)  1 Container Oral TID BM  . feeding supplement (RESOURCE BREEZE)  1 Container Oral BID BM  . furosemide  80 mg Intravenous Q12H  . heparin  5,000 Units Subcutaneous 3 times per day  . hydrALAZINE  200 mg Oral TID  . insulin aspart  0-9 Units Subcutaneous TID WC  . promethazine  12.5 mg Intravenous Once  . sevelamer carbonate  1,600 mg Oral TID WC  . silver sulfADIAZINE   Topical BID  . sodium bicarbonate  650 mg Oral BID  . sodium chloride  3 mL Intravenous Q12H   Continuous Infusions: . sodium chloride 25 mL/hr (01/11/14 1355)    Principal Problem:   Cellulitis Active Problems:   Streptococcal bacteremia   Sepsis   Hypertensive heart disease   Tobacco dependency   Chronic diastolic CHF (congestive heart failure)   Acute on chronic renal failure   Diabetes mellitus with peripheral vascular disease   Arm wound   UTI (urinary tract infection)  Time spent: 35 min  Algis DownsMarianne York, [place] JerseyPA-C [facility] Pager: [phone]  Attending Patient was seen, examined,treatment  plan was discussed with the Physician extender. I have directly reviewed the clinical findings, lab, imaging studies and management of this patient in detail. I have made the necessary changes to the above noted documentation, and agree with the documentation, as recorded by the Physician extender.  Windell NorfolkS Kylyn Sookram MD [facility].

## 2014-01-13 NOTE — Progress Notes (Signed)
Wasted 4ml of fentanyl in the sink with Aggie Cosierheresa, Charity fundraiserN.

## 2014-01-14 DIAGNOSIS — N179 Acute kidney failure, unspecified: Secondary | ICD-10-CM

## 2014-01-14 DIAGNOSIS — IMO0002 Reserved for concepts with insufficient information to code with codable children: Secondary | ICD-10-CM

## 2014-01-14 DIAGNOSIS — Z515 Encounter for palliative care: Secondary | ICD-10-CM

## 2014-01-14 DIAGNOSIS — B950 Streptococcus, group A, as the cause of diseases classified elsewhere: Secondary | ICD-10-CM

## 2014-01-14 DIAGNOSIS — N189 Chronic kidney disease, unspecified: Secondary | ICD-10-CM

## 2014-01-14 LAB — CBC
HEMATOCRIT: 23.6 % — AB (ref 36.0–46.0)
HEMOGLOBIN: 7.3 g/dL — AB (ref 12.0–15.0)
MCH: 30.3 pg (ref 26.0–34.0)
MCHC: 30.9 g/dL (ref 30.0–36.0)
MCV: 97.9 fL (ref 78.0–100.0)
Platelets: 364 10*3/uL (ref 150–400)
RBC: 2.41 MIL/uL — AB (ref 3.87–5.11)
RDW: 15.9 % — ABNORMAL HIGH (ref 11.5–15.5)
WBC: 9.3 10*3/uL (ref 4.0–10.5)

## 2014-01-14 LAB — GLUCOSE, CAPILLARY
GLUCOSE-CAPILLARY: 77 mg/dL (ref 70–99)
GLUCOSE-CAPILLARY: 86 mg/dL (ref 70–99)
GLUCOSE-CAPILLARY: 93 mg/dL (ref 70–99)
GLUCOSE-CAPILLARY: 76 mg/dL (ref 70–99)

## 2014-01-14 LAB — RENAL FUNCTION PANEL
Albumin: 2.3 g/dL — ABNORMAL LOW (ref 3.5–5.2)
Anion gap: 22 — ABNORMAL HIGH (ref 5–15)
BUN: 67 mg/dL — AB (ref 6–23)
CHLORIDE: 95 meq/L — AB (ref 96–112)
CO2: 25 meq/L (ref 19–32)
Calcium: 8.3 mg/dL — ABNORMAL LOW (ref 8.4–10.5)
Creatinine, Ser: 5.13 mg/dL — ABNORMAL HIGH (ref 0.50–1.10)
GFR calc non Af Amer: 7 mL/min — ABNORMAL LOW (ref >90)
GFR, EST AFRICAN AMERICAN: 8 mL/min — AB (ref >90)
GLUCOSE: 74 mg/dL (ref 70–99)
POTASSIUM: 4 meq/L (ref 3.7–5.3)
Phosphorus: 9 mg/dL — ABNORMAL HIGH (ref 2.3–4.6)
SODIUM: 142 meq/L (ref 137–147)

## 2014-01-14 MED ORDER — FUROSEMIDE 10 MG/ML IJ SOLN
80.0000 mg | Freq: Every day | INTRAMUSCULAR | Status: DC
  Administered 2014-01-15 – 2014-01-19 (×5): 80 mg via INTRAVENOUS
  Filled 2014-01-14 (×5): qty 8

## 2014-01-14 NOTE — Plan of Care (Signed)
Problem: Phase I Progression Outcomes Goal: Voiding-avoid urinary catheter unless indicated Outcome: Not Progressing Pt is strict intake and output at this time.  Problem: Phase III Progression Outcomes Goal: Discharge plan remains appropriate-arrangements made Outcome: Not Progressing Palliative care consulted.

## 2014-01-14 NOTE — Progress Notes (Signed)
Full note to follow:  I spoke today with Ms. Hollice EspyGibson, son Smitty CordsBruce [phone]), and granddaughter Brayton CavesJessie [phone]). Bruce gave me his daughter's number Lanora Manislizabeth [phone] although I have not spoken with her at all. We discussed Ms. Mcmeekin's poor health status: renal failure, PVD, diabetes, cellulitis, diastolic heart failure and she is becoming weaker and losing weight with decreased appetite. We discussed the decision as to if she wishes to have dialysis - she tells me that she wants to "try dialysis." We talked further about what this means and how difficult dialysis may be for her especially with her PVD. We discussed quality of life and the importance of making decisions that are right for her with the time she has left to live. We talked through some scenarios such as not tolerating dialysis or having difficulties with access - and she seemed to be overwhelmed. She is telling me now that she does want dialysis - not entirely sure on how much she understands currently about what this decision means. I would like to follow up and discuss further with her tomorrow. Smitty CordsBruce and Brayton CavesJessie tell me they would like to support her in whatever she decides - although I did notice some leading questions posed by Bruce to try dialysis. I would like to hear more from Ms. Hollice EspyGibson. She did tell me that she does not desire full code and desires DNR and her family confirms that this is what she has expressed to them in the past. I will continue to follow.   Yong ChannelAlicia Maryruth Apple, NP Palliative Medicine Team Pager # [phone] (M-F 8a-5p) Team Phone # [phone] (Nights/Weekends)

## 2014-01-14 NOTE — Clinical Social Work Placement (Addendum)
Clinical Social Work Department CLINICAL SOCIAL WORK PLACEMENT NOTE 01/14/2014  Patient:  Tricia [name],Tricia [name]  Account Number:  [id number] Admit date:  01/01/2014  Clinical Social Worker:  Lavell LusterJOSEPH BRYANT CAMPBELL, LCSWA  Date/time:  01/14/2014 03:56 PM  Clinical Social Work is seeking post-discharge placement for this patient at the following level of care:   SKILLED NURSING   (*CSW will update this form in Epic as items are completed)   01/14/2014  Patient/family provided with [facility] of Clinical Social Work's list of facilities offering this level of care within the geographic area requested by the patient (or if unable, by the patient's family).  01/14/2014  Patient/family informed of their freedom to choose among providers that offer the needed level of care, that participate in Medicare, Medicaid or managed care program needed by the patient, have an available bed and are willing to accept the patient.  01/14/2014  Patient/family informed of [facility]' ownership interest in [facility], as well as of the fact that they are under no obligation to receive care at this facility.  PASARR submitted to EDS on 01/14/2014 PASARR number received on 01/14/2014  FL2 transmitted to all facilities in geographic area requested by pt/family on  01/14/2014 FL2 transmitted to all facilities within larger geographic area on   Patient informed that his/her managed care company has contracts with or will negotiate with  certain facilities, including the following:     Patient/family informed of bed offers received:   01/17/2014-Tayari Yankee [place], LCSWA [facility]-01/17/2014 Physician recommends and patient chooses bed at    Patient to be transferred to  on   Patient to be transferred to facility by  Patient and family notified of transfer on  Name of family member notified:    The following physician request were  entered in Epic:   Additional Comments:    Roddie McBryant Campbell MSW, [place]CSWA, [place]AS[place], [phone]

## 2014-01-14 NOTE — Clinical Social Work Psychosocial (Signed)
Clinical Social Work Department BRIEF PSYCHOSOCIAL ASSESSMENT 01/14/2014  Patient:  Tricia [name], Tricia [name]     Account Number:  [id number]     Admit date:  01/01/2014  Clinical Social Worker:  Lovey Newcomer  Date/Time:  01/14/2014 02:07 PM  Referred by:  Physician  Date Referred:  01/14/2014 Referred for  SNF Placement   Other Referral:   Interview type:  Family Other interview type:   Patient and family interviewed to complete assessment    PSYCHOSOCIAL DATA Living Status:  FAMILY Admitted from facility:   Level of care:   Primary support name:  Tricia [name] Primary support relationship to patient:  CHILD, ADULT Degree of support available:   Support is good.    CURRENT CONCERNS Current Concerns  Post-Acute Placement   Other Concerns:    SOCIAL WORK ASSESSMENT / PLAN CSW met with patient, patient's son Darnell Level, and granddaughter at bedside. Patient was in and out due to recent dose of pain medication. Patient's family is requesting assistance with financial POA and will. CSW explained that CSW cannot assist with this but that CSW could assist with HPOA and living wills. CSW inquired about whether or not family would like SNF placement for patient. Family is under the understanding that the patient will need SNF but unsure of which one at this time. CSW explained SNF search/placement process to family and answered questions. CSW will follow up with bed offers.   Assessment/plan status:  Psychosocial Support/Ongoing Assessment of Needs Other assessment/ plan:   Complete FL2, Fax, PASRR   Information/referral to community resources:   CSW contact information given, son refused SNF list.    PATIENT'S/FAMILY'S RESPONSE TO PLAN OF CARE: Patient's family unsure of which SNF they would like but understand that patient will need SNF at discharge. CSW will assist with DC when appropriate.       Liz Beach MSW, [place], [place], [phone]

## 2014-01-14 NOTE — Progress Notes (Signed)
Palliative medicine consult received. Awaiting return call from family to schedule a goals of care meeting.  Tricia MaltaElizabeth Birt Reinoso, DO Palliative Medicine

## 2014-01-14 NOTE — Progress Notes (Signed)
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  She presented with complaints of left upper extremity pain, swelling, erythema, having an associated functional decline, generalized weakness, feeling ill. Patient found to have severe left upper extremity cellulitis, with initial lab work showing white count of 3000 with presence of bands, temperature 101.1, development of acute on chronic renal failure. Given severity of presentation a CT scan of her left upper extremity was obtained in the emergency room which revealed severe diffuse edematous changes consistent with severe cellulitis. She was initially started on broad-spectrum IV antibiotic therapy with vancomycin and Zosyn. General surgery was consulted.  She showed little improvement over the following days, having further deterioration to kidney function as well as becoming more encephalopathic. Blood cultures obtained on 01/01/2014, back positive for group A streptococcus in both sets. Repeat blood cultures were obtained and show no growth to date. General surgery recommended orthopedic surgery consultation.  On 01/04/2014 patient was seen and evaluated by Dr. Luiz BlareGraves of orthopedic surgery. He recommended ongoing IV antimicrobial therapy and did not feel surgical intervention was warranted at this time. Dr. Drue SecondSnider of infectious disease was consulted and recommended the discontinuation of vancomycin and Zosyn and starting Clindamycin and Ancef. With patient's deterioration to kidney function Dr. Kathrene BongoGoldsborough of nephrology was consulted as well. The patient's kidney function has continued to decrease and  hemodialysis has been discussed.  The patient does not want HD and Nephrology does not feel she is a good candidate.  Plastic surgery consult placed 6/25 and Dr. Kelly SplinterSanger performed excision and debridement of the left arm with VAC placement on 6/29.  Consideration is being given to taking the patient back to the OR on July 6th..                                                                             Assessment/Plan: Sepsis secondary to Group A strep Blood cultures positive for Group A Strep x 2 sets  Repeat blood cultures drawn on 01/02/2014 showing no growth.   Note Urine also + for >100,000 Staph (pan sensitive) Source of infection likely to be left upper extremity cellulitis.  ID consulted recommended  to Clindamycin and Ancef on 6/22  Left upper extremity cellulitis CT scan of left upper extremity showing severe diffuse changes consistent with cellulitis.  Will continue IV AB coverage, now on Clindamycin and Ancef per ID Wound care, General Surgery, plastic surgery and Orthopedics consulted Plastic surgery Dr. Kelly SplinterSanger consulted-had debridement 01/11/14.  Wound vac placed.  ?Return to OR on 7/6. prealbumin 8.4 indicating poor protein reserves-appreciate nutrition consultation.  Acute on Chronic Renal Failure Patient with history of stage IV CKD, baseline creatinine near 3.4. Creatinine seems to have plateaued at 5.13 Patient does not want HD.  Nephrology believes she would be a poor HD candidate. Placed on bicarb gtt, now BMP showing bicarb of 19. bicarb d/c 6/26 Nephrology recommends Palliative consultation for goals of care.  Chronic diastolic CHF Compensated.  Diuretics stopped on admission.  lasix started by nephrology was 80 IV bid.  Will decreased to 80 mg daily (7/2) still net + ~5.7 liters assuming I&O are correct, but Wt is significantly down (7/2) 150 lbs (7lbs below admission) On Coreg.  No Ace-I due to ARF. Strict I&Os  Metabolic Acidosis Bicab completely  resolved  Staphylococcal pyelonephritis On Ancef AB therapy until 7/4.  Change to Keflex when she is taking oral medications well.  Type 2 diabetes Mellitus CBGs in 70-100. Decrease insulin to SSI - Sensitive. Will consider d/c cbgs and insulin as patient is not eating if she chooses palliative care.  Toxic metabolic encephalopathy probably 2/2 to narcotic use and uremia. Discontinued Fentanyl PCA.  Started PRN Oxy IR and Morphine.  Son appreciates decreased pain medications as he believes this is causing her AMS on 7/1.  HTN Moderate control clonidine added per renal 6/28 0.1 mg for pressure above 170 contue hydralazine 150 tid-increased 6/29 to 200 tid, coreg 12.5 bid increased to 25 bid, amlodipine 10  Failure to thrive Patient weak, lethargic, not really eating or drinking.  Not a good HD candidate. If this does not change acute medical care will not be of benefit to her. Palliative consultation requested.  Son, Smitty Cords agrees to [employer] discussion.   Code Status: Full Family Communication:  Disposition Plan: inpatient.  Need to determine disposition.   Consultants:  General Surgery  Plastic surgery  Nephrology  ortho   HPI/Subjective: Altered / Confused on 7/1.     Objective: Filed Vitals:   01/14/14 0549  BP: 153/55  Pulse: 71  Temp:   Resp: 18    Intake/Output Summary (Last 24 hours) at 01/14/14 0915 Last data filed at 01/14/14 0656  Gross per 24 hour  Intake      0 ml  Output    600 ml  Net   -600 ml   Filed Weights   01/12/14 0633 01/13/14 0356 01/14/14 0549  Weight: 70.7 kg (155 lb 13.8 oz) 72.6 kg (160 lb 0.9 oz) 68.1 kg (150 lb 2.1 oz)    Exam:   General:  Elderly, chronically ill appearing. Awake, Alert, but appears weak and exhausted.  cardiovascular: Regular rate and rhythm, normal S1S2  Respiratory: min coarse breath sounds.  No increased work of breathing. Patient does not inspire deeply.  Abdomen: Soft nontender nondistended, +BS,  no masses  Extremities:  No edema.  LE amputation.   Data Reviewed: Basic Metabolic Panel:  Recent Labs Lab 01/09/14 0455 01/10/14 0555 01/11/14 0544 01/12/14 0520 01/14/14 0420  NA 143  142 140 141 141 142  K 3.9  3.8 3.7 3.9 3.9 4.0  CL 100  100 95* 98 98 95*  CO2 23  22 25 27 26 25   GLUCOSE 77  76 84 98 69* 74  BUN 71*  72* 66* 66* 66* 67*  CREATININE 4.84*  4.77* 4.61* 4.84* 5.16* 5.13*  CALCIUM 8.3*  8.4 8.6 8.3* 7.7* 8.3*  PHOS 7.3*  7.3* 7.7* 9.3* 9.1* 9.0*   Liver Function Tests:  Recent Labs Lab 01/09/14 0455 01/10/14 0555 01/11/14 0544 01/12/14 0520 01/14/14 0420  ALBUMIN 2.1*  2.1* 2.3* 2.1* 2.0* 2.3*   CBC:  Recent Labs Lab 01/09/14 0455 01/11/14 0544 01/12/14 0520 01/14/14 0420  WBC 12.5* 10.6* 10.4 9.3  NEUTROABS 9.8* 8.8* 8.0*  --   HGB 8.9* 9.0* 7.2* 7.3*  HCT 27.6* 28.8* 23.3* 23.6*  MCV 91.4 96.3 95.1 97.9  PLT 362 351 368 364   BNP (last 3 results)  Recent Labs  07/28/13 1650  PROBNP 32203.0*   CBG:  Recent Labs Lab 01/13/14 0800 01/13/14 1148 01/13/14 1652 01/13/14 2127 01/14/14 0754  GLUCAP 85 86 86 70 77     Studies: No results found.  Scheduled Meds: . amLODipine  10 mg Oral Daily  . carvedilol  25 mg Oral BID WC  .  ceFAZolin (ANCEF) IV  1 g Intravenous Q24H  . darbepoetin (ARANESP) injection - NON-DIALYSIS  100 mcg Subcutaneous Q Tue-1800  . docusate sodium  100 mg Oral BID  . [START ON 01/15/2014] furosemide  80 mg Intravenous Daily  . heparin  5,000 Units Subcutaneous 3 times per day  . hydrALAZINE  200 mg Oral TID  . insulin aspart  0-9 Units Subcutaneous TID WC  . promethazine  12.5 mg Intravenous Once  . sevelamer carbonate  1,600 mg Oral TID WC  . silver sulfADIAZINE   Topical BID  . sodium bicarbonate  650 mg Oral BID  . sodium chloride  3 mL Intravenous Q12H   Continuous Infusions: . sodium chloride 25 mL/hr (01/11/14 1355)    Principal Problem:   Cellulitis Active Problems:    Streptococcal bacteremia   Sepsis   Hypertensive heart disease   Tobacco dependency   Chronic diastolic CHF (congestive heart failure)   Acute on chronic renal failure   Diabetes mellitus with peripheral vascular disease   Arm wound   UTI (urinary tract infection)  Time spent: 35 min  Algis DownsMarianne York, [place] JerseyPA-C [facility] Pager: [phone]  Attending  Patient was seen, examined,treatment plan was discussed with the Physician extender. I have directly reviewed the clinical findings, lab, imaging studies and management of this patient in detail. I have made the necessary changes to the above noted documentation, and agree with the documentation, as recorded by the Physician extender.  Windell NorfolkS Aikam Vinje MD [facility].

## 2014-01-14 NOTE — Evaluation (Signed)
Physical Therapy Evaluation Patient Details Name: Tricia [name] MRN: [id number] DOB: Apr 21, 1934 Today's Date: 01/14/2014   History of Present Illness  80 y.o. female [facility]14 with generalized weakness and L upper extremity swelling.  Pt dx with sepsis due to group A strep, L upper extremity cellulits s/p debridement 01/11/14 with wound vac, acute on chronic renal failure considering starting HD, metabolic acidosis, and staohyloccoccal pyelonephritis.  Pt with significant PMhx of PVD, DM, CKD, R AKA, HTN, MI, CABG, and diastolic heart failure.    Clinical Impression  Pt is agreeable to EOB sitting today with PT.  She is very deconditioned and has very limited tolerance to activity.  She needs two person support just to get seated EOB.  She is no longer at a level where she could go home without two person maximal help for all of her mobility, toileting, and self care needs.  PT recommending SNF at discharge with palliative care following at SNF.  PT will continue to follow acutely and progress to scoot or slide board transfers OOB as pt can tolerate.   PT to follow acutely for deficits listed below.       Follow Up Recommendations SNF    Equipment Recommendations  None recommended by PT    Recommendations for Other Services   None    Precautions / Restrictions Precautions Precautions: Fall      Mobility  Bed Mobility Overal bed mobility: Needs Assistance Bed Mobility: Supine to Sit;Sit to Supine     Supine to sit: +2 for physical assistance;Max assist Sit to supine: +2 for physical assistance;Total assist   General bed mobility comments: Two person physical assist to initiate movement of legs hips and trunk.  Pt able to hand on to PT during transition to sit, but did not really initiate much as far as moving her legs over or flexing her trunk.                          Balance Overall balance assessment: Needs assistance Sitting-balance support: Feet  unsupported;Bilateral upper extremity supported Sitting balance-Leahy Scale: Poor Sitting balance - Comments: Min assist to maintain sitting balance EOB.  Pt unable to maintain extended trunk and head (folds forward onto her pelvis and rests her head on this therapist's shoulder as she quickly fatigues.  Sat EOB ~5 mins with max encouragement to continue, but pt too fatigued to continue.                                      Pertinent Vitals/Pain HR and vitals monitored due to low Hgb (this is likely chronic in nature due to renal failure).  HR and O2 sats stable.  Last listed BP was also stable.  Pt had no reports of increased lightheadedness EOB.     Home Living Family/patient expects to be discharged to:: Private residence Living Arrangements: Children Available Help at Discharge: Family;Available 24 hours/day Type of Home: House Home Access: Ramped entrance     Home Layout: One level Home Equipment: Walker - 2 wheels;Cane - single point;Tub bench;Wheelchair - manual (left chair) Additional Comments: Pt sleeps in lift chairSon available to help anytime needed    Prior Function Level of Independence: Needs assistance   Gait / Transfers Assistance Needed: per pt she can scoot pivot without her prosthesis into her WC and onto the toilet at  home.  Son helps her with , "whatever I need".      Comments: Pt did not use O2 at home PTA        Extremity/Trunk Assessment   Upper Extremity Assessment: Generalized weakness;LUE deficits/detail       LUE Deficits / Details: left arm bandaged and splinted wtih wound vac coming from beneath bandage.  Pt is able to assisted lift left arm to 90 degress for positioning with pillow.    Lower Extremity Assessment: Generalized weakness;RLE deficits/detail;LLE deficits/detail RLE Deficits / Details: R AKA, can move against gravity and side to side. ROM at hip to where she can sit EOB at ~90 degrees flexed.  LLE Deficits / Details:  left leg with signs of poor circulation (decreased pink color, gray, some places blue/black in color).  Pt able to lift leg minimally against gravity 3-/5 at hip, 3-/5 knee, 3/5 ankle strength .  Knee/hip flexion ROM enough to get her to 90/90 flexion at knee and hip seated EOB.   Cervical / Trunk Assessment: Kyphotic;Other exceptions  Communication   Communication: No difficulties  Cognition Arousal/Alertness: Lethargic Behavior During Therapy: Flat affect Overall Cognitive Status: No family/caregiver present to determine baseline cognitive functioning                               Assessment/Plan    PT Assessment Patient needs continued PT services  PT Diagnosis Difficulty walking;Abnormality of gait;Generalized weakness;Altered mental status   PT Problem List Decreased strength;Decreased activity tolerance;Decreased balance;Decreased mobility;Decreased cognition;Cardiopulmonary status limiting activity;Decreased knowledge of use of DME;Obesity;Pain  PT Treatment Interventions DME instruction;Functional mobility training;Therapeutic activities;Therapeutic exercise;Balance training;Neuromuscular re-education;Cognitive remediation;Patient/family education;Wheelchair mobility training;Modalities   PT Goals (Current goals can be found in the Care Plan section) Acute Rehab PT Goals Patient Stated Goal: to rest PT Goal Formulation: With patient Time For Goal Achievement: 01/28/14 Potential to Achieve Goals: Good    Frequency Min 2X/week    End of Session   Activity Tolerance: Patient limited by fatigue;Patient limited by lethargy Patient left: in bed;with call bell/phone within reach;with bed alarm set Nurse Communication: Mobility status         Time: 1455-1520 PT Time Calculation (min): 25 min   Charges:   PT Evaluation $Initial PT Evaluation Tier I: 1 Procedure PT Treatments $Therapeutic Activity: 8-22 mins        Bransyn Adami B. Hektor Huston, PT, DPT [phone]    01/14/2014, 3:37 PM

## 2014-01-14 NOTE — Progress Notes (Signed)
Patient ID: Ursula BeathMargie E Fodge, female   DOB: October 17, 1933, 80 y.o.   MRN: [id number] S:lethargic and nonresponsive this am O:BP 153/55  Pulse 71  Temp(Src) 97.6 F (36.4 C) (Oral)  Resp 18  Ht 5\' 8"  (1.727 m)  Wt 68.1 kg (150 lb 2.1 oz)  BMI 22.83 kg/m2  SpO2 99%  Intake/Output Summary (Last 24 hours) at 01/14/14 0929 Last data filed at 01/14/14 0656  Gross per 24 hour  Intake      0 ml  Output    600 ml  Net   -600 ml   Intake/Output: I/O last 3 completed shifts: In: 0  Out: 1750 [Urine:1750]  Intake/Output this shift:    Weight change: -4.5 kg (-9 lb 14.7 oz) Gen:ill-appearing WF, lethargic and nonverbal CVS:no rub Resp:occ rhonchi WUJ:WJXBJYAbd:benign Ext:s/p R AKA   Recent Labs Lab 01/08/14 0228 01/09/14 0455 01/10/14 0555 01/11/14 0544 01/12/14 0520 01/14/14 0420  NA 141 143  142 140 141 141 142  K 4.3 3.9  3.8 3.7 3.9 3.9 4.0  CL 101 100  100 95* 98 98 95*  CO2 19 23  22 25 27 26 25   GLUCOSE 83 77  76 84 98 69* 74  BUN 76* 71*  72* 66* 66* 66* 67*  CREATININE 4.89* 4.84*  4.77* 4.61* 4.84* 5.16* 5.13*  ALBUMIN 2.1* 2.1*  2.1* 2.3* 2.1* 2.0* 2.3*  CALCIUM 8.7 8.3*  8.4 8.6 8.3* 7.7* 8.3*  PHOS 7.2* 7.3*  7.3* 7.7* 9.3* 9.1* 9.0*   Liver Function Tests:  Recent Labs Lab 01/11/14 0544 01/12/14 0520 01/14/14 0420  ALBUMIN 2.1* 2.0* 2.3*   No results found for this basename: LIPASE, AMYLASE,  in the last 168 hours No results found for this basename: AMMONIA,  in the last 168 hours CBC:  Recent Labs Lab 01/09/14 0455 01/11/14 0544 01/12/14 0520 01/14/14 0420  WBC 12.5* 10.6* 10.4 9.3  NEUTROABS 9.8* 8.8* 8.0*  --   HGB 8.9* 9.0* 7.2* 7.3*  HCT 27.6* 28.8* 23.3* 23.6*  MCV 91.4 96.3 95.1 97.9  PLT 362 351 368 364   Cardiac Enzymes: No results found for this basename: CKTOTAL, CKMB, CKMBINDEX, TROPONINI,  in the last 168 hours CBG:  Recent Labs Lab 01/13/14 0800 01/13/14 1148 01/13/14 1652 01/13/14 2127 01/14/14 0754  GLUCAP 85 86 86 70  77    Iron Studies: No results found for this basename: IRON, TIBC, TRANSFERRIN, FERRITIN,  in the last 72 hours Studies/Results: No results found. Marland Kitchen. amLODipine  10 mg Oral Daily  . carvedilol  25 mg Oral BID WC  .  ceFAZolin (ANCEF) IV  1 g Intravenous Q24H  . darbepoetin (ARANESP) injection - NON-DIALYSIS  100 mcg Subcutaneous Q Tue-1800  . docusate sodium  100 mg Oral BID  . [START ON 01/15/2014] furosemide  80 mg Intravenous Daily  . heparin  5,000 Units Subcutaneous 3 times per day  . hydrALAZINE  200 mg Oral TID  . insulin aspart  0-9 Units Subcutaneous TID WC  . promethazine  12.5 mg Intravenous Once  . sevelamer carbonate  1,600 mg Oral TID WC  . silver sulfADIAZINE   Topical BID  . sodium bicarbonate  650 mg Oral BID  . sodium chloride  3 mL Intravenous Q12H    BMET    Component Value Date/Time   NA 142 01/14/2014 0420   K 4.0 01/14/2014 0420   CL 95* 01/14/2014 0420   CO2 25 01/14/2014 0420   GLUCOSE 74 01/14/2014 0420  BUN 67* 01/14/2014 0420   CREATININE 5.13* 01/14/2014 0420   CALCIUM 8.3* 01/14/2014 0420   GFRNONAA 7* 01/14/2014 0420   GFRAA 8* 01/14/2014 0420   CBC    Component Value Date/Time   WBC 9.3 01/14/2014 0420   RBC 2.41* 01/14/2014 0420   HGB 7.3* 01/14/2014 0420   HCT 23.6* 01/14/2014 0420   PLT 364 01/14/2014 0420   MCV 97.9 01/14/2014 0420   MCH 30.3 01/14/2014 0420   MCHC 30.9 01/14/2014 0420   RDW 15.9* 01/14/2014 0420   LYMPHSABS 1.4 01/12/2014 0520   MONOABS 0.9 01/12/2014 0520   EOSABS 0.1 01/12/2014 0520   BASOSABS 0.1 01/12/2014 0520     Assessment/Plan:  1. AKI/CKD in setting of cellulitis and bacteremia. Non-oliguric. Scr up to 5.16 over the last 24 hours. She has not been interested in RRT as an outpt but when I discussed hospice if her renal function would continue to deteriorate she then said she would do dialysis temporarily if indicated. She may need TDC placed tomorrow if her renal function continues to worsen.  1. She remains ambivalent about dialysis and  given her overall decline and poor QOL, I feel it is time to get palliative care on board to set goals/limits of care and to help guide decision for dialysis or CMO (she has expressed her wishes not to pursue dialysis in the past but waffled yesterday and is delirious this am. I have also spoken with her son who was also aware of her wishes not to proceed with dialysis) 2. Cellulitis/strep bacteremia- s/p surgical debridement per plastics 3. HTN- still elevated on hydralazine and norvasc as well as coreg. No ACE/ARB due to advanced CKD.  1. Would recommend adding clonidine 0.1 mg bid as HR and BP tolerates 4. Anemia- on aranesp and now with ABLA on ACDz. Transfuse prn. 5. Metabolic acidosis- improved with po bicarb 6. PVD 7. SHPTH- start renvela 1600 qac tid. 8. Dispo- still with intermittent confusion related to narcotics. Weak, may need SNF vs. HHC at discharge. Await palliative care input as CMO may be in her best interest given her decline in function and overall health. 9.   Mouhamadou Gittleman A

## 2014-01-15 DIAGNOSIS — Z515 Encounter for palliative care: Secondary | ICD-10-CM

## 2014-01-15 LAB — RENAL FUNCTION PANEL
ALBUMIN: 2.3 g/dL — AB (ref 3.5–5.2)
Anion gap: 19 — ABNORMAL HIGH (ref 5–15)
BUN: 69 mg/dL — AB (ref 6–23)
CHLORIDE: 96 meq/L (ref 96–112)
CO2: 28 mEq/L (ref 19–32)
CREATININE: 5.21 mg/dL — AB (ref 0.50–1.10)
Calcium: 8.1 mg/dL — ABNORMAL LOW (ref 8.4–10.5)
GFR calc Af Amer: 8 mL/min — ABNORMAL LOW (ref >90)
GFR, EST NON AFRICAN AMERICAN: 7 mL/min — AB (ref >90)
Glucose, Bld: 71 mg/dL (ref 70–99)
Phosphorus: 9.1 mg/dL — ABNORMAL HIGH (ref 2.3–4.6)
Potassium: 3.8 mEq/L (ref 3.7–5.3)
Sodium: 143 mEq/L (ref 137–147)

## 2014-01-15 LAB — GLUCOSE, CAPILLARY
Glucose-Capillary: 71 mg/dL (ref 70–99)
Glucose-Capillary: 89 mg/dL (ref 70–99)
Glucose-Capillary: 122 mg/dL — ABNORMAL HIGH (ref 70–99)
Glucose-Capillary: 70 mg/dL (ref 70–99)

## 2014-01-15 MED ORDER — OXYCODONE-ACETAMINOPHEN 5-325 MG PO TABS
1.0000 | ORAL_TABLET | ORAL | Status: DC | PRN
  Administered 2014-01-16 – 2014-01-19 (×3): 1 via ORAL
  Filled 2014-01-15 (×4): qty 1

## 2014-01-15 MED ORDER — MORPHINE SULFATE 2 MG/ML IJ SOLN
1.0000 mg | INTRAMUSCULAR | Status: DC | PRN
  Administered 2014-01-15 – 2014-01-17 (×5): 1 mg via INTRAVENOUS
  Filled 2014-01-15 (×6): qty 1

## 2014-01-15 NOTE — Progress Notes (Signed)
During Pt position change, foley catheter found to be leaking.  MD on call, Dr. David StallFeliz-Ortiz, notified and instructed RN to replace the foley catheter.  Current catheter removed, peri-care performed, and a 53F foley catheter with 10cc balloon placed using sterile technique.  Urine return was yellow and cloudy with sediment.  Pt tolerated placement well.  Will continue to monitor.

## 2014-01-15 NOTE — Progress Notes (Signed)
Patient ID: Tricia [name], female   DOB: August 07, 1933, 80 y.o.   MRN: [id number] S:lethargic but arousable and verbal but keeps her eyes closed O:BP 177/54  Pulse 77  Temp(Src) 97.8 F (36.6 C) (Oral)  Resp 20  Ht 5\' 8"  (1.727 m)  Wt 67.767 kg (149 lb 6.4 oz)  BMI 22.72 kg/m2  SpO2 95%  Intake/Output Summary (Last 24 hours) at 01/15/14 1325 Last data filed at 01/15/14 0500  Gross per 24 hour  Intake    315 ml  Output   1150 ml  Net   -835 ml   Intake/Output: I/O last 3 completed shifts: In: 315 [P.O.:115; IV Piggyback:200] Out: 1450 [Urine:1450]  Intake/Output this shift:    Weight change: -0.333 kg (-11.7 oz) WUJ:WJXBJGen:frail, chronically ill-appearing CVS:no rub Resp:cta YNW:GNFAOZAbd:benign Ext:s/p R AKA, left arm with wound vac in place   Recent Labs Lab 01/09/14 0455 01/10/14 0555 01/11/14 0544 01/12/14 0520 01/14/14 0420 01/15/14 0405  NA 143  142 140 141 141 142 143  K 3.9  3.8 3.7 3.9 3.9 4.0 3.8  CL 100  100 95* 98 98 95* 96  CO2 23  22 25 27 26 25 28   GLUCOSE 77  76 84 98 69* 74 71  BUN 71*  72* 66* 66* 66* 67* 69*  CREATININE 4.84*  4.77* 4.61* 4.84* 5.16* 5.13* 5.21*  ALBUMIN 2.1*  2.1* 2.3* 2.1* 2.0* 2.3* 2.3*  CALCIUM 8.3*  8.4 8.6 8.3* 7.7* 8.3* 8.1*  PHOS 7.3*  7.3* 7.7* 9.3* 9.1* 9.0* 9.1*   Liver Function Tests:  Recent Labs Lab 01/12/14 0520 01/14/14 0420 01/15/14 0405  ALBUMIN 2.0* 2.3* 2.3*   No results found for this basename: LIPASE, AMYLASE,  in the last 168 hours No results found for this basename: AMMONIA,  in the last 168 hours CBC:  Recent Labs Lab 01/09/14 0455 01/11/14 0544 01/12/14 0520 01/14/14 0420  WBC 12.5* 10.6* 10.4 9.3  NEUTROABS 9.8* 8.8* 8.0*  --   HGB 8.9* 9.0* 7.2* 7.3*  HCT 27.6* 28.8* 23.3* 23.6*  MCV 91.4 96.3 95.1 97.9  PLT 362 351 368 364   Cardiac Enzymes: No results found for this basename: CKTOTAL, CKMB, CKMBINDEX, TROPONINI,  in the last 168 hours CBG:  Recent Labs Lab 01/14/14 1146  01/14/14 1811 01/14/14 2206 01/15/14 0806 01/15/14 1203  GLUCAP 86 93 76 71 89    Iron Studies: No results found for this basename: IRON, TIBC, TRANSFERRIN, FERRITIN,  in the last 72 hours Studies/Results: No results found. Marland Kitchen. amLODipine  10 mg Oral Daily  . carvedilol  25 mg Oral BID WC  .  ceFAZolin (ANCEF) IV  1 g Intravenous Q24H  . darbepoetin (ARANESP) injection - NON-DIALYSIS  100 mcg Subcutaneous Q Tue-1800  . docusate sodium  100 mg Oral BID  . furosemide  80 mg Intravenous Daily  . heparin  5,000 Units Subcutaneous 3 times per day  . hydrALAZINE  200 mg Oral TID  . insulin aspart  0-9 Units Subcutaneous TID WC  . promethazine  12.5 mg Intravenous Once  . sevelamer carbonate  1,600 mg Oral TID WC  . silver sulfADIAZINE   Topical BID  . sodium bicarbonate  650 mg Oral BID  . sodium chloride  3 mL Intravenous Q12H    BMET    Component Value Date/Time   NA 143 01/15/2014 0405   K 3.8 01/15/2014 0405   CL 96 01/15/2014 0405   CO2 28 01/15/2014 0405   GLUCOSE 71  01/15/2014 0405   BUN 69* 01/15/2014 0405   CREATININE 5.21* 01/15/2014 0405   CALCIUM 8.1* 01/15/2014 0405   GFRNONAA 7* 01/15/2014 0405   GFRAA 8* 01/15/2014 0405   CBC    Component Value Date/Time   WBC 9.3 01/14/2014 0420   RBC 2.41* 01/14/2014 0420   HGB 7.3* 01/14/2014 0420   HCT 23.6* 01/14/2014 0420   PLT 364 01/14/2014 0420   MCV 97.9 01/14/2014 0420   MCH 30.3 01/14/2014 0420   MCHC 30.9 01/14/2014 0420   RDW 15.9* 01/14/2014 0420   LYMPHSABS 1.4 01/12/2014 0520   MONOABS 0.9 01/12/2014 0520   EOSABS 0.1 01/12/2014 0520   BASOSABS 0.1 01/12/2014 0520     Assessment/Plan:  1. AKI/CKD in setting of cellulitis and bacteremia. Non-oliguric. Scr up to 5.16 over the last 24 hours. She has not been interested in RRT as an outpt but when I discussed hospice if her renal function would continue to deteriorate she then said she would do dialysis temporarily if indicated.  She continues to fluctuate with her mental status and is quite  somnolent today. 1. She remains non-oliguric and Scr has reached a plateau 2. No urgent indication for dialysis.   3. She has stated yesterday that she would proceed with dialysis, although she also tells us all she wants to do is rest.  She is not eating and is only awake long enough to ask for more pain medications.  She has had a steady decline in functional status and poor QOL.  I do not feel she is a suitable candidate for dialysis and strongly encouraged her to eat and participate with PT/OT.  I appreciate Palliative care input and hopefully they can help with pain management but improve her mental status and ability to work with PT/OT and eat.   2. Cellulitis/strep bacteremia- s/p surgical debridement per plastics 3. HTN- still elevated on hydralazine and norvasc as well as coreg. No ACE/ARB due to advanced CKD.  1. Would recommend adding clonidine 0.1 mg bid as HR and BP tolerates 4. Anemia- on aranesp and now with ABLA on ACDz. Transfuse prn. 5. Metabolic acidosis- improved with po bicarb 6. PVD 7. SHPTH- start renvela 1600 qac tid. 8. Dispo- still with intermittent confusion related to narcotics. Weak, may need SNF vs. HHC at discharge. Await palliative care input as CMO may be in her best interest given her decline in function and overall health. 9.   Ayad Nieman A

## 2014-01-15 NOTE — Progress Notes (Signed)
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  She presented with complaints of left upper extremity pain, swelling, erythema, having an associated functional decline, generalized weakness, feeling ill. Patient found to have severe left upper extremity cellulitis, with initial lab work showing white count of 3000 with presence of bands, temperature 101.1, development of acute on chronic renal failure. Given severity of presentation a CT scan of her left upper extremity was obtained in the emergency room which revealed severe diffuse edematous changes consistent with severe cellulitis. She was initially started on broad-spectrum IV antibiotic therapy with vancomycin and Zosyn. General surgery was consulted.  She showed little improvement over the following days, having further deterioration to kidney function as well as becoming more encephalopathic. Blood cultures obtained on 01/01/2014, back positive for group A streptococcus in both sets. Repeat blood cultures were obtained and show no growth to date. General surgery recommended orthopedic surgery consultation.  On 01/04/2014 patient was seen and evaluated by Dr. Luiz [name] of orthopedic surgery. He recommended ongoing IV antimicrobial therapy and did not feel surgical intervention was warranted at this time. Dr. Drue [name] of infectious disease was consulted and recommended the discontinuation of vancomycin and Zosyn and starting Clindamycin and Ancef. With patient's deterioration to kidney function Dr. Kathrene [name] of nephrology was consulted as well. The patient's kidney function has continued to decrease and  hemodialysis has been discussed.  The patient does not want HD and Nephrology does not feel she is a good candidate.  Plastic surgery consult placed 6/25 and Dr. Kelly [name] performed excision and debridement of the left arm with VAC placement on 6/29.  Consideration is being given to taking the patient back to the OR on July 6th..                                                                             Assessment/Plan: Sepsis secondary to Group A strep Blood cultures positive for Group A Strep x 2 sets  Repeat blood cultures drawn on 01/02/2014 showing no growth.   Note Urine also + for >100,000 Staph (pan sensitive) Source of infection likely to be left upper extremity cellulitis.  ID consulted recommended  to Clindamycin and Ancef on 6/22.  Antibiotics will be complete on 7/4.  Left upper extremity cellulitis CT scan of left upper extremity showing severe diffuse changes consistent with cellulitis.  Will continue IV AB coverage, now on Clindamycin and Ancef per ID Wound care, General Surgery, plastic surgery and Orthopedics consulted Plastic surgery Dr. Kelly [name] consulted-had debridement 01/11/14.  Infection was very deep.  Wound vac placed.  ?Return to OR on 7/6.  Dr. Kelly [name] predicts this will be a long process with multiple debridements. prealbumin 8.4 indicating poor protein reserves-appreciate nutrition consultation.  Acute on Chronic Renal Failure Patient with history of stage IV CKD, baseline creatinine near 3.4. Creatinine slowly rising.  approx 5.2 on 7/3. Patient did not initially want HD,  Now she states she  wants HD.  Unfortunately she has very poor vasculature and is not a good HD candidate. Nephrology recommends Palliative consultation for goals of care.   Chronic diastolic CHF Compensated.  Diuretics stopped on admission.  lasix started by nephrology was 80 IV bid.  Will decreased to 80 mg daily (7/2) still net + ~4.9 liters assuming I&O are correct, but Wt is significantly down  (7/2) 149 lbs (7lbs below admission) On Coreg.  No Ace-I due to ARF. Strict I&Os  Metabolic Acidosis Due to renal failure. Bicarb drip.  completely resolved  Staphylococcal pyelonephritis On Ancef AB therapy until 7/4.  Change to Keflex when she is taking oral medications well.  Type 2 diabetes Mellitus CBGs in 70-100. Decrease insulin to SSI - Sensitive. Will consider d/c cbgs and insulin as patient is not eating if she chooses palliative care.  Toxic metabolic encephalopathy probably 2/2 to narcotic use and uremia. Discontinued Fentanyl PCA.  Started PRN Oxy IR and Morphine.  Son appreciates decreased pain medications as he believes this was causing her AMS on 7/1.  HTN Moderate control clonidine added per renal 6/28 0.1 mg for pressure above 170 contue hydralazine 150 tid-increased 6/29 to 200 tid, coreg 12.5 bid increased to 25 bid, amlodipine 10  Failure to thrive Patient weak, lethargic, not really eating or drinking.  Not a good HD candidate.  Will try a regular diet. If this does not change acute medical care including HD will not be of benefit to her. Palliative consultation requested.  Son, Tricia [name] agrees to [employer] discussion. Palliative engaged on 7/2.     Code Status: Full Family Communication:  Disposition Plan: inpatient.  Need to determine disposition.   Consultants:  General Surgery  Plastic surgery  Nephrology  ortho   HPI/Subjective: Alert, reports she has no appetite and the food does not taste good.   Objective: Filed Vitals:   01/15/14 0440  BP: 177/54  Pulse: 77  Temp: 97.8 F (36.6 C)  Resp: 20    Intake/Output Summary (Last 24 hours) at 01/15/14 1231 Last data filed at 01/15/14 0500  Gross per 24 hour  Intake    315 ml  Output   1150 ml  Net   -835 ml   Filed Weights   01/13/14 0356 01/14/14 0549 01/15/14 0500  Weight: 72.6 kg (160 lb 0.9 oz) 68.1 kg (150 lb 2.1 oz) 67.767 kg (149 lb 6.4 oz)    Exam:   General:  Elderly,  chronically ill appearing. Awake, Alert, but appears weak and exhausted.  cardiovascular: Regular rate and rhythm, normal S1S2  Respiratory: min coarse breath sounds.  No increased work of breathing. Patient does not inspire deeply.  Abdomen: Soft nontender nondistended, +BS, no masses  Extremities:  No edema.  LE amputation.   Data Reviewed: Basic Metabolic Panel:  Recent Labs Lab 01/10/14 0555 01/11/14 0544 01/12/14 0520 01/14/14 0420 01/15/14 0405  NA 140 141 141 142 143  K 3.7 3.9 3.9 4.0 3.8  CL 95* 98 98 95* 96  CO2 25 27 26 25 28   GLUCOSE 84 98 69* 74 71  BUN 66* 66* 66* 67* 69*  CREATININE 4.61* 4.84* 5.16* 5.13* 5.21*  CALCIUM 8.6 8.3* 7.7* 8.3* 8.1*  PHOS 7.7* 9.3* 9.1* 9.0* 9.1*   Liver Function Tests:  Recent Labs Lab 01/10/14 0555 01/11/14 0544 01/12/14 0520 01/14/14 0420 01/15/14 0405  ALBUMIN 2.3* 2.1* 2.0* 2.3* 2.3*   CBC:  Recent Labs Lab 01/09/14 0455 01/11/14 0544 01/12/14 0520 01/14/14 0420  WBC 12.5* 10.6* 10.4 9.3  NEUTROABS 9.8* 8.8* 8.0*  --   HGB 8.9* 9.0* 7.2* 7.3*  HCT 27.6* 28.8* 23.3* 23.6*  MCV 91.4 96.3 95.1 97.9  PLT 362 351 368 364   BNP (last 3 results)  Recent Labs  07/28/13 1650  PROBNP 32203.0*   CBG:  Recent Labs Lab 01/14/14 1146 01/14/14 1811 01/14/14 2206 01/15/14 0806 01/15/14 1203  GLUCAP 86 93 76 71 89     Studies: No results found.  Scheduled Meds: . amLODipine  10 mg Oral Daily  . carvedilol  25 mg Oral BID WC  .  ceFAZolin (ANCEF) IV  1 g Intravenous Q24H  . darbepoetin (ARANESP) injection - NON-DIALYSIS  100 mcg Subcutaneous Q Tue-1800  . docusate sodium  100 mg Oral BID  . furosemide  80 mg Intravenous Daily  . heparin  5,000 Units Subcutaneous 3 times per day  . hydrALAZINE  200 mg Oral TID  . insulin aspart  0-9 Units Subcutaneous TID WC  . promethazine  12.5 mg Intravenous Once  . sevelamer carbonate  1,600 mg Oral TID WC  . silver sulfADIAZINE   Topical BID  . sodium  bicarbonate  650 mg Oral BID  . sodium chloride  3 mL Intravenous Q12H   Continuous Infusions: . sodium chloride 25 mL/hr (01/11/14 1355)    Principal Problem:   Cellulitis Active Problems:   Streptococcal bacteremia   Sepsis   Hypertensive heart disease   Tobacco dependency   Chronic diastolic CHF (congestive heart failure)   Acute on chronic renal failure   Diabetes mellitus with peripheral vascular disease   Arm wound   UTI (urinary tract infection)  Time spent: 35 min  Algis Downs, [place] [facility] Pager: [phone]  Attending Patient was seen, examined,treatment plan was discussed with the Physician extender. I have directly reviewed the clinical findings, lab, imaging studies and management of this patient in detail. I have made the necessary changes to the above noted documentation, and agree with the documentation, as recorded by the Physician extender.  Windell Norfolk [name] [facility].

## 2014-01-15 NOTE — Consult Note (Signed)
Patient ZO:XWRUEA:Tricia [name]      DOB: July 20, 1933      VWU:[id number]     [facility]    Consult Requested by: Algis DownsMarianne York, PA   PCP: Kaleen MaskELKINS,WILSON OLIVER, MD Reason for Consultation: GOC and options   Phone Number:[phone]  Assessment of patients Current state: Tricia [name] is a 80 yo female with multiple heath problems.   I spoke today with Tricia [name], son Smitty CordsBruce [phone]), and granddaughter Brayton CavesJessie [phone]). Bruce gave me his daughter's number Lanora Manislizabeth [phone] although I have not spoken with her at all. We discussed Ms. Turski's poor health status: stage IV renal failure, group A strep + blood cultures, PVD, diabetes type 2, left arm cellulitis (requiring surgical debridement and now wound vac), diastolic heart failure and she is becoming weaker and losing weight with decreased appetite with failure to thrive. We discussed the decision as to if she wishes to have dialysis - she tells me that she wants to "try dialysis." We talked further about what this means and how difficult dialysis may be for her especially with her PVD. We discussed quality of life and the importance of making decisions that are right for her with the time she has left to live. We talked through some scenarios such as not tolerating dialysis or having difficulties with access - and she seemed to be overwhelmed. She is telling me now that she does want dialysis - not entirely sure on how much she understands currently about what this decision means. I would like to follow up and discuss further with her tomorrow. Smitty CordsBruce and Brayton CavesJessie tell me they would like to support her in whatever she decides - although I did notice some leading questions posed by Bruce to try dialysis. I would like to hear more from Tricia [name]. She did tell me that she does not desire full code and desires DNR and her family confirms that this is what she has expressed to them in the past. I will continue to  follow.     Goals of Care: 1.  Code Status: DNR   2. Scope of Treatment: Continue available and offered medical interventions. Much discussion around dialysis.    4. Disposition: Will need SNF   3. Symptom Management:   1. Pain: Oxycodone and morphine prn.  2. Fever: Acetaminophen prn.  3. Heartburn: Maalox prn.  4. Bowel Regimen: Colace scheduled. Miralax daily prn.  5. Nausea: Ondansetron prn.    4. Psychosocial: Emotional support provided to patient and family during difficult conversation.    Patient Documents Completed or Given: Document Given Completed  Advanced Directives Pkt yes   MOST    DNR    Gone from [facility]    Hard Choices      Brief HPI: 80 yo female with failure to thrive, stage IV renal disease, and complicated cellulitis. Contemplating if she desires dialysis.    ROS: + pain (buttocks and left arm), denies anxiety/constipation    PMH:  Past Medical History  Diagnosis Date  . Hypertension   . Hyperlipidemia   . Coronary artery disease     post CABG in 1999, with 2-vessel disease -- - LIMA graft to the LAD and a left radial artery graft to the PDA   . Old inferior wall myocardial infarction 1999  . 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 Tobacco dependency   . CKD (chronic kidney disease) stage 4, GFR  15-29 ml/min     followed by Dr. Arrie Aran  . Diastolic heart failure   . Aortic stenosis     Mild  . Type 2 diabetes mellitus with vascular disease   . CKD (chronic kidney disease), stage IV   . Hypertensive heart disease   . Anxiety   . Depression   . Cataract   . Ovarian cyst      PSH: Past Surgical History  Procedure Laterality Date  . Partial hysterectomy    . Total vaginal hysterectomy    . 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 Iliac vein angioplasty / stenting  08/10/2010     Left common iliac stenting  . Coronary artery bypass graft  01/26/1998    Est. EF of 40-45% -- with 2-vessel disease -- LIMA  graft to the LAD and a left radial artery graft to the PDA   . Cardiac catheterization    . I&d extremity Left 01/11/2014    Procedure: EXCISION  AND DEBRIDEMENT OF LEFT  ARM BURN;  Surgeon: Wayland Denis, DO;  Location: [facility] OR;  Service: Plastics;  Laterality: Left;  . Application of wound vac Left 01/11/2014    Procedure: APPLICATION OF WOUND VAC;  Surgeon: Wayland Denis, DO;  Location: [facility] OR;  Service: Plastics;  Laterality: Left;  . Application of a-cell of extremity Left 01/11/2014    Procedure: APPLICATION OF A-CELL OF LEFT LOWER ARM;  Surgeon: Wayland Denis, DO;  Location: [facility] OR;  Service: Plastics;  Laterality: Left;   I have reviewed the FH and SH and  If appropriate update it with new information. No Known Allergies Scheduled Meds: . amLODipine  10 mg Oral Daily  . carvedilol  25 mg Oral BID WC  .  ceFAZolin (ANCEF) IV  1 g Intravenous Q24H  . darbepoetin (ARANESP) injection - NON-DIALYSIS  100 mcg Subcutaneous Q Tue-1800  . docusate sodium  100 mg Oral BID  . furosemide  80 mg Intravenous Daily  . heparin  5,000 Units Subcutaneous 3 times per day  . hydrALAZINE  200 mg Oral TID  . insulin aspart  0-9 Units Subcutaneous TID WC  . promethazine  12.5 mg Intravenous Once  . sevelamer carbonate  1,600 mg Oral TID WC  . silver sulfADIAZINE   Topical BID  . sodium bicarbonate  650 mg Oral BID  . sodium chloride  3 mL Intravenous Q12H   Continuous Infusions: . sodium chloride 25 mL/hr (01/11/14 1355)   PRN Meds:.acetaminophen, acetaminophen, alum & mag hydroxide-simeth, cloNIDine, morphine injection, ondansetron (ZOFRAN) IV, ondansetron, oxyCODONE, oxyCODONE-acetaminophen, polyethylene glycol, sorbitol    BP 164/54  Pulse 76  Temp(Src) 98.9 F (37.2 C) (Oral)  Resp 18  Ht 5\' 8"  (1.727 m)  Wt 67.767 kg (149 lb 6.4 oz)  BMI 22.72 kg/m2  SpO2 94%   PPS: 30%   Intake/Output Summary (Last 24 hours) at 01/15/14 1525 Last data filed at 01/15/14 0500  Gross per 24 hour   Intake    315 ml  Output   1150 ml  Net   -835 ml   LBM: 01/13/14                         Physical Exam:  General: NAD, resting, weak, lethargic, ill appearing  HEENT: [place]/AT, temporal muscle wasting, no JVD, moist mucosa  Chest: Shallow breathes, no labored breathing, symmetric  CVS: RRR, S1 S2  Abdomen: Soft, NT, ND, +BS  Ext: MAE, 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, no edema, warm to touch, left arm dressing CDI with wound vac in place  Neuro: Arousable, oriented to person and place and at least somewhat to situation - not very conversational, follows commands  Labs: CBC    Component Value Date/Time   WBC 9.3 01/14/2014 0420   RBC 2.41* 01/14/2014 0420   HGB 7.3* 01/14/2014 0420   HCT 23.6* 01/14/2014 0420   PLT 364 01/14/2014 0420   MCV 97.9 01/14/2014 0420   MCH 30.3 01/14/2014 0420   MCHC 30.9 01/14/2014 0420   RDW 15.9* 01/14/2014 0420   LYMPHSABS 1.4 01/12/2014 0520   MONOABS 0.9 01/12/2014 0520   EOSABS 0.1 01/12/2014 0520   BASOSABS 0.1 01/12/2014 0520    BMET    Component Value Date/Time   NA 143 01/15/2014 0405   K 3.8 01/15/2014 0405   CL 96 01/15/2014 0405   CO2 28 01/15/2014 0405   GLUCOSE 71 01/15/2014 0405   BUN 69* 01/15/2014 0405   CREATININE 5.21* 01/15/2014 0405   CALCIUM 8.1* 01/15/2014 0405   GFRNONAA 7* 01/15/2014 0405   GFRAA 8* 01/15/2014 0405    CMP     Component Value Date/Time   NA 143 01/15/2014 0405   K 3.8 01/15/2014 0405   CL 96 01/15/2014 0405   CO2 28 01/15/2014 0405   GLUCOSE 71 01/15/2014 0405   BUN 69* 01/15/2014 0405   CREATININE 5.21* 01/15/2014 0405   CALCIUM 8.1* 01/15/2014 0405   PROT 5.8* 01/02/2014 0316   ALBUMIN 2.3* 01/15/2014 0405   AST 34 01/02/2014 0316   ALT 12 01/02/2014 0316   ALKPHOS 40 01/02/2014 0316   BILITOT 0.2* 01/02/2014 0316   GFRNONAA 7* 01/15/2014 0405   GFRAA 8* 01/15/2014 0405      Time In Time Out Total Time Spent with Patient Total Overall Time  1230 1400 75min 90min    Greater than 50%  of this time was spent counseling and coordinating care related to the above  assessment and plan.  Yong ChannelAlicia Eyleen Rawlinson, NP Palliative Medicine Team Pager # [phone] (M-F 8a-5p) Team Phone # [phone] (Nights/Weekends)

## 2014-01-15 NOTE — Progress Notes (Signed)
Chaplain responded to request from pt's family. Pt's son, Smitty CordsBruce, stated that he has been the sole care provider for his mother for the past several years. He stated that he has accumulated many medical bills over that time, and that the pt's financial will should be changed to reflect his commitment to his mother. Chaplain practiced empathic listening, affirming him for his devotion his mother, but also explained that financial wills are outside the purview of the hospital's services. Chaplain advised son to seek lawyer to make these changes to will. Son Smitty CordsBruce also asked about healthcare POA but stated he did not think it was necessary to their situation.

## 2014-01-15 NOTE — Progress Notes (Signed)
Progress Note from the Palliative Medicine Team at [place]: I met today with granddaughter, Benjamine Mola, and Ms. Noberto Retort. Ms. Speights is still saying that she would want "to try dialysis." I am concerned about her understanding about the consequences of her decisions. She is still very much yes and no but not speaking much - likely due to the pain medication. She also tells Benjamine Mola that she just wants to be comfortable and let her sleep - which is contradictory to doing dialysis and aggressive care measures. She also clearly states that she would NOT want a feeding tube under any circumstances. I asked her that even if she cannot eat/drink enough and that means she would die and she says "just let me go." Benjamine Mola says that she is saying she wants to try dialysis because other family members are encouraging her to do dialysis if it comes to that. Benjamine Mola believes that she would want more comfort approach. I will continue to follow and support this patient and family. I would recommend palliative to follow when she discharges.     Objective: No Known Allergies Scheduled Meds: . amLODipine  10 mg Oral Daily  . carvedilol  25 mg Oral BID WC  .  ceFAZolin (ANCEF) IV  1 g Intravenous Q24H  . darbepoetin (ARANESP) injection - NON-DIALYSIS  100 mcg Subcutaneous Q Tue-1800  . docusate sodium  100 mg Oral BID  . furosemide  80 mg Intravenous Daily  . heparin  5,000 Units Subcutaneous 3 times per day  . hydrALAZINE  200 mg Oral TID  . insulin aspart  0-9 Units Subcutaneous TID WC  . promethazine  12.5 mg Intravenous Once  . sevelamer carbonate  1,600 mg Oral TID WC  . silver sulfADIAZINE   Topical BID  . sodium bicarbonate  650 mg Oral BID  . sodium chloride  3 mL Intravenous Q12H   Continuous Infusions: . sodium chloride 25 mL/hr (01/11/14 1355)   PRN Meds:.acetaminophen, acetaminophen, alum & mag hydroxide-simeth, cloNIDine, morphine injection, ondansetron (ZOFRAN) IV,  ondansetron, oxyCODONE, oxyCODONE-acetaminophen, polyethylene glycol, sorbitol  BP 177/54  Pulse 77  Temp(Src) 97.8 F (36.6 C) (Oral)  Resp 20  Ht _0  (1.727 m)  Wt 67.767 kg (149 lb 6.4 oz)  BMI 22.72 kg/m2  SpO2 95%   PPS: 30% at best   Pain Location: buttocks and lt arm   Intake/Output Summary (Last 24 hours) at 01/15/14 1426 Last data filed at 01/15/14 0500  Gross per 24 hour  Intake    315 ml  Output   1150 ml  Net   -835 ml      LBM: 7/1      Physical Exam:  General: NAD, resting, weak, lethargic, ill appearing HEENT:  /AT, temporal muscle wasting, no JVD, moist mucosa Chest: Shallow breathes, no labored breathing, symmetric CVS: RRR, S1 S2 Abdomen: Soft, NT, ND, +BS Ext: MAE, 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, no edema, warm to touch, left arm dressing CDI with wound vac in place Neuro: Arousable, oriented to person and place and at least somewhat to situation - not very conversational, follows commands  Labs: CBC    Component Value Date/Time   WBC 9.3 01/14/2014 0420   RBC 2.41* 01/14/2014 0420   HGB 7.3* 01/14/2014 0420   HCT 23.6* 01/14/2014 0420   PLT 364 01/14/2014 0420   MCV 97.9 01/14/2014 0420   MCH 30.3 01/14/2014 0420   MCHC 30.9 01/14/2014 0420   RDW 15.9* 01/14/2014 0420   LYMPHSABS 1.4  01/12/2014 0520   MONOABS 0.9 01/12/2014 0520   EOSABS 0.1 01/12/2014 0520   BASOSABS 0.1 01/12/2014 0520    BMET    Component Value Date/Time   NA 143 01/15/2014 0405   K 3.8 01/15/2014 0405   CL 96 01/15/2014 0405   CO2 28 01/15/2014 0405   GLUCOSE 71 01/15/2014 0405   BUN 69* 01/15/2014 0405   CREATININE 5.21* 01/15/2014 0405   CALCIUM 8.1* 01/15/2014 0405   GFRNONAA 7* 01/15/2014 0405   GFRAA 8* 01/15/2014 0405    CMP     Component Value Date/Time   NA 143 01/15/2014 0405   K 3.8 01/15/2014 0405   CL 96 01/15/2014 0405   CO2 28 01/15/2014 0405   GLUCOSE 71 01/15/2014 0405   BUN 69* 01/15/2014 0405   CREATININE 5.21* 01/15/2014 0405   CALCIUM 8.1* 01/15/2014 0405   PROT 5.8* 01/02/2014 0316   ALBUMIN 2.3*  01/15/2014 0405   AST 34 01/02/2014 0316   ALT 12 01/02/2014 0316   ALKPHOS 40 01/02/2014 0316   BILITOT 0.2* 01/02/2014 0316   GFRNONAA 7* 01/15/2014 0405   GFRAA 8* 01/15/2014 0405    Assessment and Plan: 1. Code Status: DNR 2. Symptom Control: 1. Pain: UTILIZE oxycodone-acetaminophen 10-325 mg every 4 hours prior to using morphine.  Decreased morphine to 1 mg IV every 4 hours prn.  2. Fever: Acetaminophen prn. 3. Heartburn: Maalox prn. 4. Bowel Regimen: Colace scheduled. Miralax daily prn.  5. Nausea: Ondansetron prn.  3. Psycho/Social: Emotional support provided to patient and family at bedside.  4. Disposition: SNF with palliative? To be determined on outcomes    Time In Time Out Total Time Spent with Patient Total Overall Time  1220 1250 49mn 389m    Greater than 50%  of this time was spent counseling and coordinating care related to the above assessment and plan.  AlVinie SillNP Palliative Medicine Team Pager # [phone]-F 8a-5p) Team Phone # [phone]/Weekends)

## 2014-01-16 ENCOUNTER — Other Ambulatory Visit: Payer: Self-pay | Admitting: Plastic Surgery

## 2014-01-16 DIAGNOSIS — S41102D Unspecified open wound of left upper arm, subsequent encounter: Secondary | ICD-10-CM

## 2014-01-16 DIAGNOSIS — IMO0002 Reserved for concepts with insufficient information to code with codable children: Secondary | ICD-10-CM

## 2014-01-16 LAB — RENAL FUNCTION PANEL
Albumin: 2.3 g/dL — ABNORMAL LOW (ref 3.5–5.2)
Anion gap: 18 — ABNORMAL HIGH (ref 5–15)
BUN: 73 mg/dL — AB (ref 6–23)
CO2: 28 mEq/L (ref 19–32)
CREATININE: 5.3 mg/dL — AB (ref 0.50–1.10)
Calcium: 8.4 mg/dL (ref 8.4–10.5)
Chloride: 94 mEq/L — ABNORMAL LOW (ref 96–112)
GFR calc Af Amer: 8 mL/min — ABNORMAL LOW (ref >90)
GFR, EST NON AFRICAN AMERICAN: 7 mL/min — AB (ref >90)
Glucose, Bld: 65 mg/dL — ABNORMAL LOW (ref 70–99)
PHOSPHORUS: 7.8 mg/dL — AB (ref 2.3–4.6)
Potassium: 3.9 mEq/L (ref 3.7–5.3)
Sodium: 140 mEq/L (ref 137–147)

## 2014-01-16 LAB — GLUCOSE, CAPILLARY
GLUCOSE-CAPILLARY: 91 mg/dL (ref 70–99)
Glucose-Capillary: 77 mg/dL (ref 70–99)
Glucose-Capillary: 81 mg/dL (ref 70–99)
Glucose-Capillary: 83 mg/dL (ref 70–99)

## 2014-01-16 MED ORDER — DEXTROSE-NACL 5-0.45 % IV SOLN
INTRAVENOUS | Status: DC
  Administered 2014-01-16 – 2014-01-19 (×2): 75 mL/h via INTRAVENOUS

## 2014-01-16 NOTE — Progress Notes (Signed)
Patient ID: Tricia [name], female   DOB: 08-28-1933, 80 y.o.   MRN: [id number] S:lethargic but arousable, complains of pain all over. O:BP 177/60  Pulse 77  Temp(Src) 98.1 F (36.7 C) (Oral)  Resp 18  Ht 5\' 8"  (1.727 m)  Wt 67.586 kg (149 lb)  BMI 22.66 kg/m2  SpO2 93%  Intake/Output Summary (Last 24 hours) at 01/16/14 0935 Last data filed at 01/15/14 1713  Gross per 24 hour  Intake      0 ml  Output    500 ml  Net   -500 ml   Intake/Output: I/O last 3 completed shifts: In: 275 [P.O.:75; IV Piggyback:200] Out: 800 [Urine:800]  Intake/Output this shift:    Weight change: -0.181 kg (-6.4 oz) BJY:NWGNFAOZHGen:Lethargic, disheveled WF  CVS:no rub Resp:cta YQM:VHQIONAbd:benign Ext:s/p RAKA, left arm in wound vac   Recent Labs Lab 01/10/14 0555 01/11/14 0544 01/12/14 0520 01/14/14 0420 01/15/14 0405 01/16/14 0422  NA 140 141 141 142 143 140  K 3.7 3.9 3.9 4.0 3.8 3.9  CL 95* 98 98 95* 96 94*  CO2 25 27 26 25 28 28   GLUCOSE 84 98 69* 74 71 65*  BUN 66* 66* 66* 67* 69* 73*  CREATININE 4.61* 4.84* 5.16* 5.13* 5.21* 5.30*  ALBUMIN 2.3* 2.1* 2.0* 2.3* 2.3* 2.3*  CALCIUM 8.6 8.3* 7.7* 8.3* 8.1* 8.4  PHOS 7.7* 9.3* 9.1* 9.0* 9.1* 7.8*   Liver Function Tests:  Recent Labs Lab 01/14/14 0420 01/15/14 0405 01/16/14 0422  ALBUMIN 2.3* 2.3* 2.3*   No results found for this basename: LIPASE, AMYLASE,  in the last 168 hours No results found for this basename: AMMONIA,  in the last 168 hours CBC:  Recent Labs Lab 01/11/14 0544 01/12/14 0520 01/14/14 0420  WBC 10.6* 10.4 9.3  NEUTROABS 8.8* 8.0*  --   HGB 9.0* 7.2* 7.3*  HCT 28.8* 23.3* 23.6*  MCV 96.3 95.1 97.9  PLT 351 368 364   Cardiac Enzymes: No results found for this basename: CKTOTAL, CKMB, CKMBINDEX, TROPONINI,  in the last 168 hours CBG:  Recent Labs Lab 01/15/14 0806 01/15/14 1203 01/15/14 1711 01/15/14 2102 01/16/14 0813  GLUCAP 71 89 122* 70 77    Iron Studies: No results found for this basename: IRON, TIBC,  TRANSFERRIN, FERRITIN,  in the last 72 hours Studies/Results: No results found. Marland Kitchen. amLODipine  10 mg Oral Daily  . carvedilol  25 mg Oral BID WC  .  ceFAZolin (ANCEF) IV  1 g Intravenous Q24H  . darbepoetin (ARANESP) injection - NON-DIALYSIS  100 mcg Subcutaneous Q Tue-1800  . docusate sodium  100 mg Oral BID  . furosemide  80 mg Intravenous Daily  . heparin  5,000 Units Subcutaneous 3 times per day  . hydrALAZINE  200 mg Oral TID  . insulin aspart  0-9 Units Subcutaneous TID WC  . promethazine  12.5 mg Intravenous Once  . sevelamer carbonate  1,600 mg Oral TID WC  . silver sulfADIAZINE   Topical BID  . sodium bicarbonate  650 mg Oral BID  . sodium chloride  3 mL Intravenous Q12H    BMET    Component Value Date/Time   NA 140 01/16/2014 0422   K 3.9 01/16/2014 0422   CL 94* 01/16/2014 0422   CO2 28 01/16/2014 0422   GLUCOSE 65* 01/16/2014 0422   BUN 73* 01/16/2014 0422   CREATININE 5.30* 01/16/2014 0422   CALCIUM 8.4 01/16/2014 0422   GFRNONAA 7* 01/16/2014 0422   GFRAA 8* 01/16/2014  0422   CBC    Component Value Date/Time   WBC 9.3 01/14/2014 0420   RBC 2.41* 01/14/2014 0420   HGB 7.3* 01/14/2014 0420   HCT 23.6* 01/14/2014 0420   PLT 364 01/14/2014 0420   MCV 97.9 01/14/2014 0420   MCH 30.3 01/14/2014 0420   MCHC 30.9 01/14/2014 0420   RDW 15.9* 01/14/2014 0420   LYMPHSABS 1.4 01/12/2014 0520   MONOABS 0.9 01/12/2014 0520   EOSABS 0.1 01/12/2014 0520   BASOSABS 0.1 01/12/2014 0520     Assessment/Plan:  1. AKI/CKD in setting of cellulitis and bacteremia. Non-oliguric. Scr up to 5.3 over the last 24 hours. She has not been interested in RRT as an outpt but when I discussed hospice if her renal function would continue to deteriorate she then said she would do dialysis temporarily if indicated. She continues to fluctuate with her mental status and is quite somnolent today.  1. She remains non-oliguric and Scr has reached a plateau 2. Currently Not a candidate for dialysis.  3. She has stated yesterday that  she would proceed with dialysis, although she also tells us all she wants to do is sleep. She is not eating and is only awake long enough to ask for more pain medications. She has had a steady decline in functional status and poor QOL. I do not feel she is a suitable candidate for dialysis and strongly encouraged her to eat and participate with PT/OT. I appreciate Palliative care input and hopefully they can help with pain management but improve her mental status and ability to work with PT/OT and eat.  4. Will start IVF's as she is not taking anything po 2. Cellulitis/strep bacteremia- s/p surgical debridement per plastics 3. HTN- still elevated on hydralazine and norvasc as well as coreg. No ACE/ARB due to advanced CKD.  1. Would recommend adding clonidine 0.1 mg bid as HR and BP tolerates 4. Anemia- on aranesp and now with ABLA on ACDz. Transfuse prn. 5. Metabolic acidosis- improved with po bicarb 6. PVD 7. Narcotics dependence- pt takes 8, 7.5mg  oxycodone tabs a day (15mg  QID) and has continually asked for pain meds when  She is awake, hopefully palliative care can offer a med which will not put her in a stupor yet relieve her pain.  8. SHPTH- start renvela 1600 qac tid. 9. Dispo- still with intermittent confusion related to narcotics. Weak, may need SNF vs. HHC at discharge. Await palliative care input as CMO may be in her best interest given her decline in function and overall health. 10.  Ryian Lynde A

## 2014-01-16 NOTE — Progress Notes (Signed)
5 Days Post-Op  Subjective: The patient is resting in bed.  Not eating and continues to decrease in activity.    Objective: Vital signs in last 24 hours: Temp:  [98.1 F (36.7 C)-99 F (37.2 C)] 98.1 F (36.7 C) (07/04 0433) Pulse Rate:  [66-77] 77 (07/04 0831) Resp:  [18] 18 (07/04 0433) BP: (143-177)/(54-63) 177/60 mmHg (07/04 0831) SpO2:  [93 %-95 %] 93 % (07/04 0433) Weight:  [67.586 kg (149 lb)] 67.586 kg (149 lb) (07/04 0433) Last BM Date: 01/13/14  Intake/Output from previous day: 07/03 0701 - 07/04 0700 In: -  Out: 500 [Urine:500] Intake/Output this shift:    General appearance: no distress  Left arm VAC in place with good seal.  Lab Results:   Recent Labs  01/14/14 0420  WBC 9.3  HGB 7.3*  HCT 23.6*  PLT 364   BMET  Recent Labs  01/15/14 0405 01/16/14 0422  NA 143 140  K 3.8 3.9  CL 96 94*  CO2 28 28  GLUCOSE 71 65*  BUN 69* 73*  CREATININE 5.21* 5.30*  CALCIUM 8.1* 8.4   PT/INR No results found for this basename: LABPROT, INR,  in the last 72 hours ABG No results found for this basename: PHART, PCO2, PO2, HCO3,  in the last 72 hours  Studies/Results: No results found.  Anti-infectives: Anti-infectives   Start     Dose/Rate Route Frequency Ordered Stop   01/11/14 1508  polymyxin B 500,000 Units, bacitracin 50,000 Units in sodium chloride irrigation 0.9 % 500 mL irrigation  Status:  Discontinued       As needed 01/11/14 1508 01/11/14 1549   01/06/14 2200  clindamycin (CLEOCIN) IVPB 600 mg  Status:  Discontinued     600 mg 100 mL/hr over 30 Minutes Intravenous 3 times per day 01/06/14 2014 01/11/14 0957   01/04/14 1400  clindamycin (CLEOCIN) IVPB 600 mg  Status:  Discontinued     600 mg 100 mL/hr over 30 Minutes Intravenous 3 times per day 01/04/14 1309 01/06/14 1710   01/04/14 1400  ceFAZolin (ANCEF) IVPB 1 g/50 mL premix     1 g 100 mL/hr over 30 Minutes Intravenous Every 24 hours 01/04/14 1309     01/03/14 2200  vancomycin (VANCOCIN)  IVPB 1000 mg/200 mL premix  Status:  Discontinued     1,000 mg 200 mL/hr over 60 Minutes Intravenous Every 48 hours 01/01/14 1910 01/04/14 1309   01/03/14 1600  vancomycin (VANCOCIN) IVPB 1000 mg/200 mL premix  Status:  Discontinued     1,000 mg 200 mL/hr over 60 Minutes Intravenous Every 48 hours 01/01/14 1513 01/01/14 1903   01/01/14 2359  piperacillin-tazobactam (ZOSYN) IVPB 2.25 g  Status:  Discontinued     2.25 g 100 mL/hr over 30 Minutes Intravenous 4 times per day 01/01/14 1729 01/04/14 1309   01/01/14 1730  piperacillin-tazobactam (ZOSYN) IVPB 3.375 g  Status:  Discontinued     3.375 g 100 mL/hr over 30 Minutes Intravenous  Once 01/01/14 1729 01/04/14 1439   01/01/14 1515  vancomycin (VANCOCIN) 1,500 mg in sodium chloride 0.9 % 500 mL IVPB     1,500 mg 250 mL/hr over 120 Minutes Intravenous  Once 01/01/14 1513 01/03/14 0700   01/01/14 1445  cefTRIAXone (ROCEPHIN) 1 g in dextrose 5 % 50 mL IVPB     1 g 100 mL/hr over 30 Minutes Intravenous  Once 01/01/14 1443 01/01/14 1544      Assessment/Plan: s/p Procedure(s): EXCISION  AND DEBRIDEMENT OF LEFT  ARM BURN (Left) APPLICATION OF WOUND VAC (Left) APPLICATION OF A-CELL OF LEFT LOWER ARM (Left) Continue to have VAC in place.  Will plan to go to the OR on Monday for VAC change and placement of Acell.  LOS: 15 days    SANGER,CLAIRE 01/16/2014  

## 2014-01-16 NOTE — Progress Notes (Signed)
PATIENT DETAILS Name: Tricia [name] Age: 80 y.o. Sex: female Date of Birth: 1933/11/13 Admit Date: 01/01/2014 Admitting Physician Jeralyn Bennett, MD [id number] Joelene Millin, MD  Subjective: Arman Bogus, mildly confused  Assessment/Plan: Sepsis secondary to Group A strep  Blood cultures positive for Group A Strep x 2 sets Repeat blood cultures drawn on 01/02/2014 showing no growth.  Note Urine also + for >100,000 Staph (pan sensitive)  Source of infection likely to be left upper extremity cellulitis.  ID consulted recommended  to c/w Ancef, last day 7/4.   Left upper extremity cellulitis  CT scan of left upper extremity showing severe diffuse changes consistent with cellulitis.  Seen by ID, initally on Clindamycin and Ancef per ID, but then just on IV Ancef-last day 7/4 Wound care, General Surgery, plastic surgery and Orthopedics consulted during her stay her  Plastic surgery Dr. Kelly Splinter consulted-had debridement 01/11/14. Infection was very deep. Wound vac placed. ?Return to OR on 7/6. Dr. Kelly Splinter predicts this will be a long process with multiple debridements.  prealbumin 8.4 indicating poor protein reserves-appreciate nutrition consultation.   Acute on Chronic Renal Failure  Patient with history of stage IV CKD, baseline creatinine near 3.4. Creatinine slowly rising. approx 5.2 on 7/3.  Patient did not initially want HD, Now she states she wants HD. Unfortunately she has very poor vasculature and is not a good HD candidate.  Nephrology recommends Palliative consultation for goals of care.   Chronic diastolic CHF  Compensated.  Diuretics stopped on admission.  lasix started by nephrology was 80 IV bid. Will decreased to 80 mg daily (7/2)  still net + ~4.9 liters assuming I&O are correct, but Wt is significantly down (7/2) 149 lbs (7lbs below admission)  On Coreg. No Ace-I due to ARF.  Strict I&Os   Metabolic Acidosis  Due to renal failure. Bicarb drip. completely  resolved   Staphylococcal pyelonephritis  On Ancef AB therapy until 7/4. Change to Keflex when she is taking oral medications well.   Type 2 diabetes Mellitus  CBGs borderline low, hardly any significant PO intake, stop SSI  Toxic metabolic encephalopathy probably 2/2 to narcotic use and uremia.  -Discontinued Fentanyl PCA. Started PRN Oxy IR and Morphine.   HTN  Moderate control , c/w clonidine,Coreg Lasix, Hydralazine, Amlodipine   Failure to thrive  Patient weak, lethargic, not really eating or drinking. Not a good HD candidate. Will try a regular diet.  If this does not change acute medical care including HD will not be of benefit to her.  Palliative consultation appreciated, suspect best served by transitioning to full comfort care status  Disposition: Remain inpatient  DVT Prophylaxis: Prophylactic Heparin   Code Status:  DNR  Family Communication Son/Grand-daughter 7/3  Procedures:  Left Upper Ext Debridement 01/11/14  CONSULTS:  ID, orthopedic surgery and Plastics  Time spent 40 minutes-which includes 50% of the time with face-to-face with patient/ family and coordinating care related to the above assessment and plan.    MEDICATIONS: Scheduled Meds: . amLODipine  10 mg Oral Daily  . carvedilol  25 mg Oral BID WC  .  ceFAZolin (ANCEF) IV  1 g Intravenous Q24H  . darbepoetin (ARANESP) injection - NON-DIALYSIS  100 mcg Subcutaneous Q Tue-1800  . docusate sodium  100 mg Oral BID  . furosemide  80 mg Intravenous Daily  . heparin  5,000 Units Subcutaneous 3 times per day  . hydrALAZINE  200 mg Oral TID  . insulin aspart  0-9 Units Subcutaneous TID WC  . promethazine  12.5 mg Intravenous Once  . sevelamer carbonate  1,600 mg Oral TID WC  . silver sulfADIAZINE   Topical BID  . sodium bicarbonate  650 mg Oral BID  . sodium chloride  3 mL Intravenous Q12H   Continuous Infusions: . sodium chloride 25 mL/hr (01/11/14 1355)  . dextrose 5 % and 0.45% NaCl      PRN Meds:.acetaminophen, acetaminophen, alum & mag hydroxide-simeth, cloNIDine, morphine injection, ondansetron (ZOFRAN) IV, ondansetron, oxyCODONE, oxyCODONE-acetaminophen, polyethylene glycol, sorbitol  Antibiotics: Anti-infectives   Start     Dose/Rate Route Frequency Ordered Stop   01/11/14 1508  polymyxin B 500,000 Units, bacitracin 50,000 Units in sodium chloride irrigation 0.9 % 500 mL irrigation  Status:  Discontinued       As needed 01/11/14 1508 01/11/14 1549   01/06/14 2200  clindamycin (CLEOCIN) IVPB 600 mg  Status:  Discontinued     600 mg 100 mL/hr over 30 Minutes Intravenous 3 times per day 01/06/14 2014 01/11/14 0957   01/04/14 1400  clindamycin (CLEOCIN) IVPB 600 mg  Status:  Discontinued     600 mg 100 mL/hr over 30 Minutes Intravenous 3 times per day 01/04/14 1309 01/06/14 1710   01/04/14 1400  ceFAZolin (ANCEF) IVPB 1 g/50 mL premix     1 g 100 mL/hr over 30 Minutes Intravenous Every 24 hours 01/04/14 1309     01/03/14 2200  vancomycin (VANCOCIN) IVPB 1000 mg/200 mL premix  Status:  Discontinued     1,000 mg 200 mL/hr over 60 Minutes Intravenous Every 48 hours 01/01/14 1910 01/04/14 1309   01/03/14 1600  vancomycin (VANCOCIN) IVPB 1000 mg/200 mL premix  Status:  Discontinued     1,000 mg 200 mL/hr over 60 Minutes Intravenous Every 48 hours 01/01/14 1513 01/01/14 1903   01/01/14 2359  piperacillin-tazobactam (ZOSYN) IVPB 2.25 g  Status:  Discontinued     2.25 g 100 mL/hr over 30 Minutes Intravenous 4 times per day 01/01/14 1729 01/04/14 1309   01/01/14 1730  piperacillin-tazobactam (ZOSYN) IVPB 3.375 g  Status:  Discontinued     3.375 g 100 mL/hr over 30 Minutes Intravenous  Once 01/01/14 1729 01/04/14 1439   01/01/14 1515  vancomycin (VANCOCIN) 1,500 mg in sodium chloride 0.9 % 500 mL IVPB     1,500 mg 250 mL/hr over 120 Minutes Intravenous  Once 01/01/14 1513 01/03/14 0700   01/01/14 1445  cefTRIAXone (ROCEPHIN) 1 g in dextrose 5 % 50 mL IVPB     1 g 100  mL/hr over 30 Minutes Intravenous  Once 01/01/14 1443 01/01/14 1544       PHYSICAL EXAM: Vital signs in last 24 hours: Filed Vitals:   01/16/14 0433 01/16/14 0831 01/16/14 1013 01/16/14 1017  BP: 154/63 177/60 156/50 156/50  Pulse: 73 77    Temp: 98.1 F (36.7 C)     TempSrc: Oral     Resp: 18     Height:      Weight: 67.586 kg (149 lb)     SpO2: 93%       Weight change: -0.181 kg (-6.4 oz) Filed Weights   01/14/14 0549 01/15/14 0500 01/16/14 0433  Weight: 68.1 kg (150 lb 2.1 oz) 67.767 kg (149 lb 6.4 oz) 67.586 kg (149 lb)   Body mass index is 22.66 kg/(m^2).   Gen Exam: Awake, confused Neck: Supple, No JVD.   Chest: B/L Clear.   CVS: S1 S2 Regular, no murmurs.  Abdomen: soft,  BS +, non tender, non distended.  Extremities: LUE-dressing in place, VAC in place.Left BKA Neurologic: Non Focal.   Skin: No Rash.   Wounds: N/A.    Intake/Output from previous day:  Intake/Output Summary (Last 24 hours) at 01/16/14 1025 Last data filed at 01/15/14 1713  Gross per 24 hour  Intake      0 ml  Output    500 ml  Net   -500 ml     LAB RESULTS: CBC  Recent Labs Lab 01/11/14 0544 01/12/14 0520 01/14/14 0420  WBC 10.6* 10.4 9.3  HGB 9.0* 7.2* 7.3*  HCT 28.8* 23.3* 23.6*  PLT 351 368 364  MCV 96.3 95.1 97.9  MCH 30.1 29.4 30.3  MCHC 31.3 30.9 30.9  RDW 14.5 14.9 15.9*  LYMPHSABS 1.1 1.4  --   MONOABS 0.6 0.9  --   EOSABS 0.1 0.1  --   BASOSABS 0.1 0.1  --     Chemistries   Recent Labs Lab 01/11/14 0544 01/12/14 0520 01/14/14 0420 01/15/14 0405 01/16/14 0422  NA 141 141 142 143 140  K 3.9 3.9 4.0 3.8 3.9  CL 98 98 95* 96 94*  CO2 27 26 25 28 28   GLUCOSE 98 69* 74 71 65*  BUN 66* 66* 67* 69* 73*  CREATININE 4.84* 5.16* 5.13* 5.21* 5.30*  CALCIUM 8.3* 7.7* 8.3* 8.1* 8.4    CBG:  Recent Labs Lab 01/15/14 0806 01/15/14 1203 01/15/14 1711 01/15/14 2102 01/16/14 0813  GLUCAP 71 89 122* 70 77    GFR Estimated Creatinine Clearance: 9 ml/min  (by C-G formula based on Cr of 5.3).  Coagulation profile No results found for this basename: INR, PROTIME,  in the last 168 hours  Cardiac Enzymes No results found for this basename: CK, CKMB, TROPONINI, MYOGLOBIN,  in the last 168 hours  No components found with this basename: POCBNP,  No results found for this basename: DDIMER,  in the last 72 hours No results found for this basename: HGBA1C,  in the last 72 hours No results found for this basename: CHOL, HDL, LDLCALC, TRIG, CHOLHDL, LDLDIRECT,  in the last 72 hours No results found for this basename: TSH, T4TOTAL, FREET3, T3FREE, THYROIDAB,  in the last 72 hours No results found for this basename: VITAMINB12, FOLATE, FERRITIN, TIBC, IRON, RETICCTPCT,  in the last 72 hours No results found for this basename: LIPASE, AMYLASE,  in the last 72 hours  Urine Studies No results found for this basename: UACOL, UAPR, USPG, UPH, UTP, UGL, UKET, UBIL, UHGB, UNIT, UROB, ULEU, UEPI, UWBC, URBC, UBAC, CAST, CRYS, UCOM, BILUA,  in the last 72 hours  MICROBIOLOGY: No results found for this or any previous visit (from the past 240 hour(s)).  RADIOLOGY STUDIES/RESULTS: Dg Forearm Left  01/01/2014   CLINICAL DATA:  Redness and pain with swelling question with draining wounds over the left forearm  EXAM: LEFT FOREARM - 2 VIEW  COMPARISON:  None.  FINDINGS: A single AP view of the forearm reveals a numerous vascular clips throughout the forearm. There is heterogeneous density within the lateral aspect of the forearm soft tissues. No definite gas collections are demonstrated. The bones exhibit no acute abnormality.  IMPRESSION: There is no acute bony abnormality of the forearm. There is heterogeneous soft tissue density which may reflect cellulitis.   Electronically Signed   By: David  [place]   On: 01/01/2014 12:29   Ct Forearm Left Wo Contrast  01/01/2014   CLINICAL DATA:  Extensive swelling, cellulitis,  severe pain left forearm, patient reports symptoms  began 24-36 hours ago with weeping from the affected are, denies trauma, reporting features and chills  EXAM: CT OF THE LEFT FOREARM WITHOUT CONTRAST  TECHNIQUE: Multidetector CT imaging was performed according to the standard protocol. Multiplanar CT image reconstructions were also generated.  COMPARISON:  01/01/14 radiograph  FINDINGS: There are numerous vascular clips along the volar surface of the forearm beginning just distal to the elbow joint and extending along the volar/radial surface all the way to the level of the wrist. There is diffuse thickening of the dermis circumferentially. There is reticular edematous change in the subcutaneous soft tissues circumferentially, of moderate severity diffusely, but particularly severe along the dorsal surface. Edematous changes extend into the hand. There is no evidence of focal organized fluid collection to suggest abscess. There is no focal osseous abnormalities to suggest osteomyelitis.  IMPRESSION: Severe diffuse edematous change consistent with severe cellulitis.   Electronically Signed   By: Esperanza Heir M.D.   On: 01/01/2014 18:25   Dg Chest Port 1 View  01/01/2014   CLINICAL DATA:  Shortness of breath.  EXAM: PORTABLE CHEST - 1 VIEW  COMPARISON:  Chest x-ray 07/28/2013.  FINDINGS: Prior CABG. Cardiomegaly and pulmonary vascular prominence and bilateral interstitial prominence. No pleural effusion or pneumothorax. Pulmonary interstitial prominence has improved from prior study. No acute bony abnormality.  IMPRESSION: Improving congestive heart failure and interstitial edema .   Electronically Signed   By: Maisie Fus  Register   On: 01/01/2014 12:29    Jeoffrey Massed, MD  [facility] Pager:[phone]  If 7PM-7AM, please contact night-coverage [email or web address] Password [email or web address] 01/16/2014, 10:25 AM   LOS: 15 days   **Disclaimer: This note may have been dictated with voice recognition software. Similar sounding words can inadvertently be  transcribed and this note may contain transcription errors which may not have been corrected upon publication of note.**

## 2014-01-17 ENCOUNTER — Other Ambulatory Visit: Payer: Self-pay | Admitting: Plastic Surgery

## 2014-01-17 DIAGNOSIS — S41102D Unspecified open wound of left upper arm, subsequent encounter: Secondary | ICD-10-CM

## 2014-01-17 LAB — RENAL FUNCTION PANEL
ANION GAP: 16 — AB (ref 5–15)
Albumin: 2.4 g/dL — ABNORMAL LOW (ref 3.5–5.2)
BUN: 75 mg/dL — AB (ref 6–23)
CO2: 31 mEq/L (ref 19–32)
Calcium: 8.5 mg/dL (ref 8.4–10.5)
Chloride: 92 mEq/L — ABNORMAL LOW (ref 96–112)
Creatinine, Ser: 5.18 mg/dL — ABNORMAL HIGH (ref 0.50–1.10)
GFR calc non Af Amer: 7 mL/min — ABNORMAL LOW (ref >90)
GFR, EST AFRICAN AMERICAN: 8 mL/min — AB (ref >90)
GLUCOSE: 69 mg/dL — AB (ref 70–99)
PHOSPHORUS: 7.3 mg/dL — AB (ref 2.3–4.6)
Potassium: 3.8 mEq/L (ref 3.7–5.3)
SODIUM: 139 meq/L (ref 137–147)

## 2014-01-17 LAB — GLUCOSE, CAPILLARY
GLUCOSE-CAPILLARY: 83 mg/dL (ref 70–99)
Glucose-Capillary: 109 mg/dL — ABNORMAL HIGH (ref 70–99)
Glucose-Capillary: 106 mg/dL — ABNORMAL HIGH (ref 70–99)
Glucose-Capillary: 91 mg/dL (ref 70–99)

## 2014-01-17 NOTE — Consult Note (Signed)
I have reviewed and discussed the care of this patient in detail with the nurse practitioner including pertinent patient records, physical exam findings and data. I agree with details of this encounter.  

## 2014-01-17 NOTE — Clinical Social Work Note (Signed)
CSW continues to follow this patient for d/c plan. CSW met with patient and provided bed offer ([facility]). CSW contacted patient's son Darnell Level and provided him with bed offer. Per Bruce, he is currently under the weather, but will visit with facility and make choice prior to patient d/c. CSW stressed importance of having another family member view facility in order to make decision prior by 7/6. CSW to follow tomorrow.  [place], [place] Weekend Clinical Social Worker [phone]

## 2014-01-17 NOTE — Progress Notes (Signed)
Patient ID: Tricia [name], female   DOB: 10-07-1933, 80 y.o.   MRN: [id number] S:no complaints O:BP 130/55  Pulse 69  Temp(Src) 98.2 F (36.8 C) (Oral)  Resp 18  Ht 5\' 8"  (1.727 m)  Wt 67.722 kg (149 lb 4.8 oz)  BMI 22.71 kg/m2  SpO2 95%  Intake/Output Summary (Last 24 hours) at 01/17/14 1041 Last data filed at 01/17/14 0910  Gross per 24 hour  Intake 1083.75 ml  Output   1650 ml  Net -566.25 ml   Intake/Output: I/O last 3 completed shifts: In: 883.8 [I.V.:783.8; IV Piggyback:100] Out: 1650 [Urine:1650]  Intake/Output this shift:  Total I/O In: 200 [P.O.:200] Out: -  Weight change: 0.136 kg (4.8 oz) WUJ:WJXBJYNWGNGen:disheveled elderly WF in NAD CVS:no rub Resp:cta FAO:ZHYQMVAbd:benign Ext:s/p RAKA, wound vac on left arm   Recent Labs Lab 01/11/14 0544 01/12/14 0520 01/14/14 0420 01/15/14 0405 01/16/14 0422 01/17/14 0451  NA 141 141 142 143 140 139  K 3.9 3.9 4.0 3.8 3.9 3.8  CL 98 98 95* 96 94* 92*  CO2 27 26 25 28 28 31   GLUCOSE 98 69* 74 71 65* 69*  BUN 66* 66* 67* 69* 73* 75*  CREATININE 4.84* 5.16* 5.13* 5.21* 5.30* 5.18*  ALBUMIN 2.1* 2.0* 2.3* 2.3* 2.3* 2.4*  CALCIUM 8.3* 7.7* 8.3* 8.1* 8.4 8.5  PHOS 9.3* 9.1* 9.0* 9.1* 7.8* 7.3*   Liver Function Tests:  Recent Labs Lab 01/15/14 0405 01/16/14 0422 01/17/14 0451  ALBUMIN 2.3* 2.3* 2.4*   No results found for this basename: LIPASE, AMYLASE,  in the last 168 hours No results found for this basename: AMMONIA,  in the last 168 hours CBC:  Recent Labs Lab 01/11/14 0544 01/12/14 0520 01/14/14 0420  WBC 10.6* 10.4 9.3  NEUTROABS 8.8* 8.0*  --   HGB 9.0* 7.2* 7.3*  HCT 28.8* 23.3* 23.6*  MCV 96.3 95.1 97.9  PLT 351 368 364   Cardiac Enzymes: No results found for this basename: CKTOTAL, CKMB, CKMBINDEX, TROPONINI,  in the last 168 hours CBG:  Recent Labs Lab 01/16/14 0813 01/16/14 1213 01/16/14 1651 01/16/14 2120 01/17/14 0751  GLUCAP 77 81 91 83 109*    Iron Studies: No results found for this  basename: IRON, TIBC, TRANSFERRIN, FERRITIN,  in the last 72 hours Studies/Results: No results found. Marland Kitchen. amLODipine  10 mg Oral Daily  . carvedilol  25 mg Oral BID WC  . darbepoetin (ARANESP) injection - NON-DIALYSIS  100 mcg Subcutaneous Q Tue-1800  . docusate sodium  100 mg Oral BID  . furosemide  80 mg Intravenous Daily  . heparin  5,000 Units Subcutaneous 3 times per day  . hydrALAZINE  200 mg Oral TID  . promethazine  12.5 mg Intravenous Once  . sevelamer carbonate  1,600 mg Oral TID WC  . silver sulfADIAZINE   Topical BID  . sodium bicarbonate  650 mg Oral BID  . sodium chloride  3 mL Intravenous Q12H    BMET    Component Value Date/Time   NA 139 01/17/2014 0451   K 3.8 01/17/2014 0451   CL 92* 01/17/2014 0451   CO2 31 01/17/2014 0451   GLUCOSE 69* 01/17/2014 0451   BUN 75* 01/17/2014 0451   CREATININE 5.18* 01/17/2014 0451   CALCIUM 8.5 01/17/2014 0451   GFRNONAA 7* 01/17/2014 0451   GFRAA 8* 01/17/2014 0451   CBC    Component Value Date/Time   WBC 9.3 01/14/2014 0420   RBC 2.41* 01/14/2014 0420   HGB 7.3*  01/14/2014 0420   HCT 23.6* 01/14/2014 0420   PLT 364 01/14/2014 0420   MCV 97.9 01/14/2014 0420   MCH 30.3 01/14/2014 0420   MCHC 30.9 01/14/2014 0420   RDW 15.9* 01/14/2014 0420   LYMPHSABS 1.4 01/12/2014 0520   MONOABS 0.9 01/12/2014 0520   EOSABS 0.1 01/12/2014 0520   BASOSABS 0.1 01/12/2014 0520     Assessment/Plan:  1. AKI/CKD in setting of cellulitis and bacteremia. Non-oliguric. Scr up to 5.3 over the last 24 hours. She has not been interested in RRT as an outpt but when I discussed hospice if her renal function would continue to deteriorate she then said she would do dialysis temporarily if indicated. She continues to fluctuate with her mental status and is quite somnolent today.  1. She remains non-oliguric and Scr has finally started to decline 2. Currently Not a candidate for dialysis. (we discussed the need to limit narcotic use so she is more awake/alert and able to participate with  PT/OT) 3. She has stated in the past (as an outpt) that she would not want to live on dialysis, however several days ago she said that she would proceed with dialysis, although she also tells us all she wants to do is sleep.  She was somnolent and has been using narcotics regularly.  She is not eating and is only awake long enough to ask for more pain medications. She has had a steady decline in functional status and poor QOL. I do not feel she is a suitable candidate for dialysis at this time and strongly encouraged her to eat and participate with PT/OT. I appreciate Palliative care input and hopefully they can help with pain management but improve her mental status and ability to work with PT/OT and eat.  4. Have started IVF's as she is not taking anything po 2. Cellulitis/strep bacteremia- s/p surgical debridement per plastics and change wound vac tomorrow. 3. HTN- still elevated on hydralazine and norvasc as well as coreg. No ACE/ARB due to advanced CKD.  1. Would recommend adding clonidine 0.1 mg bid as HR and BP tolerates 4. Anemia- on aranesp and now with ABLA on ACDz. Transfuse prn. 5. Metabolic acidosis- improved with po bicarb 6. PVD 7. Narcotics dependence- pt takes 8, 7.5mg  oxycodone tabs a day (15mg  QID) and has continually asked for pain meds when She is awake, hopefully palliative care can offer a med which will not put her in a stupor yet relieve her pain.  8. SHPTH- started renvela 1600 qac tid. 9. Dispo- still with intermittent confusion related to narcotics. Weak, may need SNF vs. HHC at discharge. Await palliative care input as CMO may be in her best interest given her decline in function and overall health. 10.   Leif Loflin A

## 2014-01-17 NOTE — Clinical Social Work Note (Signed)
CSW continues to follow this patient. CSW met with patient and 2 sons. CSW discussed bed offer with patient and sons. Per patient's son, he is agreeable to [facility]. Patient stated she is also agreeable to [facility]. CSW made RN and facility aware. CSW to follow tomorrow.   [place], [place] Weekend Clinical Social Worker [phone]

## 2014-01-17 NOTE — Progress Notes (Signed)
PATIENT DETAILS Name: Tricia [name] Age: 80 y.o. 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 She presented with complaints of left upper extremity pain, swelling, erythema, having an associated functional decline, generalized weakness, feeling ill. Patient found to have severe left upper extremity cellulitis with leukopenia and bandemia.She also developed acute on chronic renal failure.Given severity of presentation a CT scan of her left upper extremity was obtained in the emergency room which revealed severe diffuse edematous changes consistent with severe cellulitis. She was initially started on broad-spectrum IV antibiotic therapy with vancomycin and Zosyn.Blood cultures obtained on 01/01/2014, back positive for group A streptococcus in both sets.Repeat blood cultures were obtained and show no growth to date.  Patient was seen in consult by Orthopedics and Plastics, underwent excision and debridement of the left arm with VAC placement on 6/29. Patient was also seen in consult by ID-Dr Snider,antibiotics were narrowed to Clinda/Ancef, and then only to Ancef, last dose was on 01/16/14.  Unfortunately, patient continues to have significantly worsening renal function, creatinine is now persistently around 5, is being followed by Nephrology, and thought not to a great candidate for Hemodialysis. Patient has also been seen by Palliative care, now a DNR, plans are for to go back to the OR on 7/6 for VAC change and placement of Acell.  Subjective: Lethargic, mildly confused-but better than last few days  Assessment/Plan: Sepsis secondary  to Group A strep  Blood cultures positive for Group A Strep x 2 sets Repeat blood cultures drawn on 01/02/2014 showing no growth.  Note Urine also + for >100,000 Staph (pan sensitive)  Source of infection likely to be left upper extremity cellulitis.  ID consulted recommended  to c/w Ancef,till 7/4. Now off all antibiotics  Left upper extremity cellulitis  CT scan of left upper extremity showing severe diffuse changes consistent with cellulitis.  Seen by ID, initally on Clindamycin and Ancef per ID, but then just on IV Ancef-last day 7/4 Wound care, General Surgery, plastic surgery and Orthopedics consulted during her stay her  Plastic surgery Dr. Kelly Splinter consulted-had debridement 01/11/14. Infection was very deep. Wound vac placed. Return to OR on 7/6. Dr. Kelly Splinter predicts this will be a long process with multiple debridements.  prealbumin 8.4 indicating poor protein reserves-appreciate nutrition consultation.   Acute on Chronic Renal Failure  Patient with history of stage IV CKD, baseline creatinine near 3.4. Creatinine slowly rising. approx 5.2 on 7/3.  Patient did not initially want HD, Now she states she wants HD. Unfortunately she has very poor vasculature and is not a good HD candidate.  Nephrology recommends Palliative consultation for goals of care appreciated, suspect will benefit from palliative care follow up upon discharge  Chronic diastolic CHF  Compensated.  Diuretics stopped on admission, but restart later, continue with 80 mg daily On Coreg. No Ace-I due to ARF.  Strict I&Os   Metabolic Acidosis  Due to renal failure. Initially on Bicarb drip-completely resolved-now on oral bicarb  Staphylococcal pyelonephritis  On Ancef AB therapy until 7/4.  Type 2 diabetes Mellitus  CBGs borderline low, hardly any significant PO intake, stop SSI  Toxic metabolic encephalopathy probably 2/2 to narcotic use and uremia.  -  Discontinued Fentanyl PCA. Started PRN Oxy IR and Morphine.    HTN  Moderate control , c/w clonidine,Coreg Lasix, Hydralazine, Amlodipine   Failure to thrive  Patient weak, lethargic, not really eating or drinking. Not a good HD candidate. Will try a regular diet.  If this does not change acute medical care including HD will not be of benefit to her.  Palliative consultation appreciated, suspect best served by transitioning to full comfort care status, but likely will be discharged to SNF with palliative care follow up  Disposition: Remain inpatient-suspect SNF with palliative care follow up on 7/7  DVT Prophylaxis: Prophylactic Heparin   Code Status:  DNR  Family Communication Son/Grand-daughter 7/3  Procedures:  Left Upper Ext Debridement 01/11/14  CONSULTS:  ID, orthopedic surgery and Plastics  MEDICATIONS: Scheduled Meds: . amLODipine  10 mg Oral Daily  . carvedilol  25 mg Oral BID WC  .  ceFAZolin (ANCEF) IV  1 g Intravenous Q24H  . darbepoetin (ARANESP) injection - NON-DIALYSIS  100 mcg Subcutaneous Q Tue-1800  . docusate sodium  100 mg Oral BID  . furosemide  80 mg Intravenous Daily  . heparin  5,000 Units Subcutaneous 3 times per day  . hydrALAZINE  200 mg Oral TID  . promethazine  12.5 mg Intravenous Once  . sevelamer carbonate  1,600 mg Oral TID WC  . silver sulfADIAZINE   Topical BID  . sodium bicarbonate  650 mg Oral BID  . sodium chloride  3 mL Intravenous Q12H   Continuous Infusions: . sodium chloride 25 mL/hr (01/11/14 1355)  . dextrose 5 % and 0.45% NaCl 75 mL/hr at 01/17/14 0600   PRN Meds:.acetaminophen, acetaminophen, alum & mag hydroxide-simeth, cloNIDine, morphine injection, ondansetron (ZOFRAN) IV, ondansetron, oxyCODONE, oxyCODONE-acetaminophen, polyethylene glycol, sorbitol  Antibiotics: Anti-infectives   Start     Dose/Rate Route Frequency Ordered Stop   01/11/14 1508  polymyxin B 500,000 Units, bacitracin 50,000 Units in sodium chloride irrigation 0.9 % 500 mL irrigation  Status:  Discontinued        As needed 01/11/14 1508 01/11/14 1549   01/06/14 2200  clindamycin (CLEOCIN) IVPB 600 mg  Status:  Discontinued     600 mg 100 mL/hr over 30 Minutes Intravenous 3 times per day 01/06/14 2014 01/11/14 0957   01/04/14 1400  clindamycin (CLEOCIN) IVPB 600 mg  Status:  Discontinued     600 mg 100 mL/hr over 30 Minutes Intravenous 3 times per day 01/04/14 1309 01/06/14 1710   01/04/14 1400  ceFAZolin (ANCEF) IVPB 1 g/50 mL premix     1 g 100 mL/hr over 30 Minutes Intravenous Every 24 hours 01/04/14 1309     01/03/14 2200  vancomycin (VANCOCIN) IVPB 1000 mg/200 mL premix  Status:  Discontinued     1,000 mg 200 mL/hr over 60 Minutes Intravenous Every 48 hours 01/01/14 1910 01/04/14 1309   01/03/14 1600  vancomycin (VANCOCIN) IVPB 1000 mg/200 mL premix  Status:  Discontinued     1,000 mg 200 mL/hr over 60 Minutes Intravenous Every 48 hours 01/01/14 1513 01/01/14 1903   01/01/14 2359  piperacillin-tazobactam (ZOSYN) IVPB 2.25 g  Status:  Discontinued     2.25 g 100 mL/hr over 30 Minutes Intravenous 4 times per day 01/01/14 1729 01/04/14 1309   01/01/14 1730  piperacillin-tazobactam (ZOSYN) IVPB 3.375 g  Status:  Discontinued     3.375 g 100 mL/hr over 30 Minutes Intravenous  Once 01/01/14 1729 01/04/14 1439   01/01/14 1515  vancomycin (VANCOCIN) 1,500 mg  in sodium chloride 0.9 % 500 mL IVPB     1,500 mg 250 mL/hr over 120 Minutes Intravenous  Once 01/01/14 1513 01/03/14 0700   01/01/14 1445  cefTRIAXone (ROCEPHIN) 1 g in dextrose 5 % 50 mL IVPB     1 g 100 mL/hr over 30 Minutes Intravenous  Once 01/01/14 1443 01/01/14 1544       PHYSICAL EXAM: Vital signs in last 24 hours: Filed Vitals:   01/16/14 1633 01/16/14 2123 01/17/14 0512 01/17/14 0805  BP: 137/65 147/50 166/56 176/54  Pulse:  68 69 69  Temp:  98.8 F (37.1 C) 98.2 F (36.8 C)   TempSrc:  Oral Oral   Resp:  18 18   Height:      Weight:   67.722 kg (149 lb 4.8 oz)   SpO2:  95%      Weight change: 0.136 kg (4.8  oz) Filed Weights   01/15/14 0500 01/16/14 0433 01/17/14 0512  Weight: 67.767 kg (149 lb 6.4 oz) 67.586 kg (149 lb) 67.722 kg (149 lb 4.8 oz)   Body mass index is 22.71 kg/(m^2).   Gen Exam: Awake, confused Neck: Supple, No JVD.   Chest: B/L Clear.   CVS: S1 S2 Regular, no murmurs.  Abdomen: soft, BS +, non tender, non distended.  Extremities: LUE-dressing in place, VAC in place.Left BKA Neurologic: Non Focal.   Skin: No Rash.   Wounds: N/A.    Intake/Output from previous day:  Intake/Output Summary (Last 24 hours) at 01/17/14 0849 Last data filed at 01/17/14 0512  Gross per 24 hour  Intake 883.75 ml  Output   1650 ml  Net -766.25 ml     LAB RESULTS: CBC  Recent Labs Lab 01/11/14 0544 01/12/14 0520 01/14/14 0420  WBC 10.6* 10.4 9.3  HGB 9.0* 7.2* 7.3*  HCT 28.8* 23.3* 23.6*  PLT 351 368 364  MCV 96.3 95.1 97.9  MCH 30.1 29.4 30.3  MCHC 31.3 30.9 30.9  RDW 14.5 14.9 15.9*  LYMPHSABS 1.1 1.4  --   MONOABS 0.6 0.9  --   EOSABS 0.1 0.1  --   BASOSABS 0.1 0.1  --     Chemistries   Recent Labs Lab 01/12/14 0520 01/14/14 0420 01/15/14 0405 01/16/14 0422 01/17/14 0451  NA 141 142 143 140 139  K 3.9 4.0 3.8 3.9 3.8  CL 98 95* 96 94* 92*  CO2 26 25 28 28 31   GLUCOSE 69* 74 71 65* 69*  BUN 66* 67* 69* 73* 75*  CREATININE 5.16* 5.13* 5.21* 5.30* 5.18*  CALCIUM 7.7* 8.3* 8.1* 8.4 8.5    CBG:  Recent Labs Lab 01/16/14 0813 01/16/14 1213 01/16/14 1651 01/16/14 2120 01/17/14 0751  GLUCAP 77 81 91 83 109*    GFR Estimated Creatinine Clearance: 9.2 ml/min (by C-G formula based on Cr of 5.18).  Coagulation profile No results found for this basename: INR, PROTIME,  in the last 168 hours  Cardiac Enzymes No results found for this basename: CK, CKMB, TROPONINI, MYOGLOBIN,  in the last 168 hours  No components found with this basename: POCBNP,  No results found for this basename: DDIMER,  in the last 72 hours No results found for this basename:  HGBA1C,  in the last 72 hours No results found for this basename: CHOL, HDL, LDLCALC, TRIG, CHOLHDL, LDLDIRECT,  in the last 72 hours No results found for this basename: TSH, T4TOTAL, FREET3, T3FREE, THYROIDAB,  in the last 72 hours No results found for this basename:  VITAMINB12, FOLATE, FERRITIN, TIBC, IRON, RETICCTPCT,  in the last 72 hours No results found for this basename: LIPASE, AMYLASE,  in the last 72 hours  Urine Studies No results found for this basename: UACOL, UAPR, USPG, UPH, UTP, UGL, UKET, UBIL, UHGB, UNIT, UROB, ULEU, UEPI, UWBC, URBC, UBAC, CAST, CRYS, UCOM, BILUA,  in the last 72 hours  MICROBIOLOGY: No results found for this or any previous visit (from the past 240 hour(s)).  RADIOLOGY STUDIES/RESULTS: Dg Forearm Left  01/01/2014   CLINICAL DATA:  Redness and pain with swelling question with draining wounds over the left forearm  EXAM: LEFT FOREARM - 2 VIEW  COMPARISON:  None.  FINDINGS: A single AP view of the forearm reveals a numerous vascular clips throughout the forearm. There is heterogeneous density within the lateral aspect of the forearm soft tissues. No definite gas collections are demonstrated. The bones exhibit no acute abnormality.  IMPRESSION: There is no acute bony abnormality of the forearm. There is heterogeneous soft tissue density which may reflect cellulitis.   Electronically Signed   By: David  SwazilandJordan   On: 01/01/2014 12:29   Ct Forearm Left Wo Contrast  01/01/2014   CLINICAL DATA:  Extensive swelling, cellulitis, severe pain left forearm, patient reports symptoms began 24-36 hours ago with weeping from the affected are, denies trauma, reporting features and chills  EXAM: CT OF THE LEFT FOREARM WITHOUT CONTRAST  TECHNIQUE: Multidetector CT imaging was performed according to the standard protocol. Multiplanar CT image reconstructions were also generated.  COMPARISON:  01/01/14 radiograph  FINDINGS: There are numerous vascular clips along the volar surface of  the forearm beginning just distal to the elbow joint and extending along the volar/radial surface all the way to the level of the wrist. There is diffuse thickening of the dermis circumferentially. There is reticular edematous change in the subcutaneous soft tissues circumferentially, of moderate severity diffusely, but particularly severe along the dorsal surface. Edematous changes extend into the hand. There is no evidence of focal organized fluid collection to suggest abscess. There is no focal osseous abnormalities to suggest osteomyelitis.  IMPRESSION: Severe diffuse edematous change consistent with severe cellulitis.   Electronically Signed   By: Esperanza Heiraymond  Rubner M.D.   On: 01/01/2014 18:25   Dg Chest Port 1 View  01/01/2014   CLINICAL DATA:  Shortness of breath.  EXAM: PORTABLE CHEST - 1 VIEW  COMPARISON:  Chest x-ray 07/28/2013.  FINDINGS: Prior CABG. Cardiomegaly and pulmonary vascular prominence and bilateral interstitial prominence. No pleural effusion or pneumothorax. Pulmonary interstitial prominence has improved from prior study. No acute bony abnormality.  IMPRESSION: Improving congestive heart failure and interstitial edema .   Electronically Signed   By: Maisie Fushomas  Register   On: 01/01/2014 12:29    Jeoffrey MassedGHIMIRE,Keerthi Hazell, MD  [facility] Pager:[phone]  If 7PM-7AM, please contact night-coverage [email or web address] Password [email or web address] 01/17/2014, 8:49 AM   LOS: 16 days   **Disclaimer: This note may have been dictated with voice recognition software. Similar sounding words can inadvertently be transcribed and this note may contain transcription errors which may not have been corrected upon publication of note.**

## 2014-01-18 ENCOUNTER — Encounter (HOSPITAL_COMMUNITY)
Admission: EM | Disposition: A | Discharge status: Skilled Nursing Facility | Payer: Self-pay | Source: Home / Self Care | Attending: Family Medicine

## 2014-01-18 ENCOUNTER — Encounter (HOSPITAL_COMMUNITY): Payer: Self-pay | Admitting: Anesthesiology

## 2014-01-18 ENCOUNTER — Encounter (HOSPITAL_COMMUNITY): Payer: Medicare Other | Admitting: Certified Registered Nurse Anesthetist

## 2014-01-18 ENCOUNTER — Inpatient Hospital Stay (HOSPITAL_COMMUNITY): Payer: Medicare Other | Admitting: Certified Registered Nurse Anesthetist

## 2014-01-18 HISTORY — PX: INCISION AND DRAINAGE OF WOUND: SHX1803

## 2014-01-18 LAB — RENAL FUNCTION PANEL
ANION GAP: 16 — AB (ref 5–15)
Albumin: 2.2 g/dL — ABNORMAL LOW (ref 3.5–5.2)
BUN: 67 mg/dL — AB (ref 6–23)
CO2: 28 meq/L (ref 19–32)
Calcium: 8 mg/dL — ABNORMAL LOW (ref 8.4–10.5)
Chloride: 90 mEq/L — ABNORMAL LOW (ref 96–112)
Creatinine, Ser: 4.7 mg/dL — ABNORMAL HIGH (ref 0.50–1.10)
GFR calc non Af Amer: 8 mL/min — ABNORMAL LOW (ref >90)
GFR, EST AFRICAN AMERICAN: 9 mL/min — AB (ref >90)
GLUCOSE: 92 mg/dL (ref 70–99)
Phosphorus: 6.1 mg/dL — ABNORMAL HIGH (ref 2.3–4.6)
Potassium: 3.7 mEq/L (ref 3.7–5.3)
SODIUM: 134 meq/L — AB (ref 137–147)

## 2014-01-18 LAB — GLUCOSE, CAPILLARY
GLUCOSE-CAPILLARY: 96 mg/dL (ref 70–99)
GLUCOSE-CAPILLARY: 90 mg/dL (ref 70–99)
Glucose-Capillary: 111 mg/dL — ABNORMAL HIGH (ref 70–99)
Glucose-Capillary: 104 mg/dL — ABNORMAL HIGH (ref 70–99)
Glucose-Capillary: 114 mg/dL — ABNORMAL HIGH (ref 70–99)

## 2014-01-18 SURGERY — IRRIGATION AND DEBRIDEMENT EXTREMITY
Anesthesia: General | Laterality: Left

## 2014-01-18 SURGERY — IRRIGATION AND DEBRIDEMENT WOUND
Anesthesia: General | Site: Arm Lower | Laterality: Left

## 2014-01-18 MED ORDER — PROPOFOL 10 MG/ML IV BOLUS
INTRAVENOUS | Status: AC
  Filled 2014-01-18: qty 20

## 2014-01-18 MED ORDER — LIDOCAINE HCL (CARDIAC) 20 MG/ML IV SOLN
INTRAVENOUS | Status: DC | PRN
  Administered 2014-01-18: 100 mg via INTRAVENOUS

## 2014-01-18 MED ORDER — METOCLOPRAMIDE HCL 5 MG/ML IJ SOLN: 10.0000 mg | Freq: Once | INTRAMUSCULAR | Status: DC | PRN

## 2014-01-18 MED ORDER — SODIUM CHLORIDE 0.9 % IR SOLN
Status: DC | PRN
  Administered 2014-01-18: 1000 mL

## 2014-01-18 MED ORDER — HYDROMORPHONE HCL PF 1 MG/ML IJ SOLN
INTRAMUSCULAR | Status: AC
  Filled 2014-01-18: qty 1

## 2014-01-18 MED ORDER — ONDANSETRON HCL 4 MG/2ML IJ SOLN
INTRAMUSCULAR | Status: DC | PRN
  Administered 2014-01-18: 4 mg via INTRAVENOUS

## 2014-01-18 MED ORDER — CEFAZOLIN SODIUM-DEXTROSE 2-3 GM-% IV SOLR
2.0000 g | Freq: Once | INTRAVENOUS | Status: AC
  Administered 2014-01-18: 2 g via INTRAVENOUS

## 2014-01-18 MED ORDER — SODIUM CHLORIDE 0.9 % IV SOLN
INTRAVENOUS | Status: DC
  Administered 2014-01-18: 12:00:00 via INTRAVENOUS

## 2014-01-18 MED ORDER — HYDROMORPHONE HCL PF 1 MG/ML IJ SOLN
0.2500 mg | INTRAMUSCULAR | Status: DC | PRN
  Administered 2014-01-18 (×2): 0.5 mg via INTRAVENOUS

## 2014-01-18 MED ORDER — SODIUM CHLORIDE 0.9 % IV SOLN
INTRAVENOUS | Status: DC | PRN
  Administered 2014-01-18: 12:00:00 via INTRAVENOUS

## 2014-01-18 MED ORDER — PROPOFOL 10 MG/ML IV BOLUS
INTRAVENOUS | Status: DC | PRN
  Administered 2014-01-18: 120 mg via INTRAVENOUS

## 2014-01-18 MED ORDER — CEFAZOLIN SODIUM-DEXTROSE 2-3 GM-% IV SOLR
INTRAVENOUS | Status: AC
  Filled 2014-01-18: qty 50

## 2014-01-18 MED ORDER — ONDANSETRON HCL 4 MG/2ML IJ SOLN
INTRAMUSCULAR | Status: AC
  Filled 2014-01-18: qty 2

## 2014-01-18 MED ORDER — OXYCODONE HCL 5 MG/5ML PO SOLN: 5.0000 mg | Freq: Once | ORAL | Status: DC | PRN

## 2014-01-18 MED ORDER — FENTANYL CITRATE 0.05 MG/ML IJ SOLN
INTRAMUSCULAR | Status: DC | PRN
  Administered 2014-01-18 (×2): 25 ug via INTRAVENOUS

## 2014-01-18 MED ORDER — SODIUM CHLORIDE 0.9 % IR SOLN
Status: DC | PRN
  Administered 2014-01-18: 13:00:00

## 2014-01-18 MED ORDER — PHENYLEPHRINE 40 MCG/ML (10ML) SYRINGE FOR IV PUSH (FOR BLOOD PRESSURE SUPPORT)
PREFILLED_SYRINGE | INTRAVENOUS | Status: AC
  Filled 2014-01-18: qty 10

## 2014-01-18 MED ORDER — OXYCODONE HCL 5 MG PO TABS
5.0000 mg | ORAL_TABLET | Freq: Once | ORAL | Status: DC | PRN
Start: 2014-01-18 — End: 2014-01-18

## 2014-01-18 MED ORDER — FENTANYL CITRATE 0.05 MG/ML IJ SOLN
INTRAMUSCULAR | Status: AC
  Filled 2014-01-18: qty 5

## 2014-01-18 SURGICAL SUPPLY — 49 items
BAG DECANTER FOR FLEXI CONT (MISCELLANEOUS) ×1 IMPLANT
BANDAGE GAUZE ELAST BULKY 4 IN (GAUZE/BANDAGES/DRESSINGS) ×2 IMPLANT
BLADE 10 SAFETY STRL DISP (BLADE) ×1 IMPLANT
BLADE SURG ROTATE 9660 (MISCELLANEOUS) IMPLANT
BURN MATRIX MATRISTEM 10X15 (Tissue) ×1 IMPLANT
CANISTER SUCTION 2500CC (MISCELLANEOUS) ×2 IMPLANT
CHLORAPREP W/TINT 26ML (MISCELLANEOUS) IMPLANT
CONT SPEC STER OR (MISCELLANEOUS) IMPLANT
CONT SPECI 4OZ STER CLIK (MISCELLANEOUS) ×1 IMPLANT
COVER SURGICAL LIGHT HANDLE (MISCELLANEOUS) ×2 IMPLANT
DRAPE INCISE IOBAN 66X45 STRL (DRAPES) ×1 IMPLANT
DRAPE PED LAPAROTOMY (DRAPES) ×1 IMPLANT
DRAPE PROXIMA HALF (DRAPES) IMPLANT
DRSG ADAPTIC 3X8 NADH LF (GAUZE/BANDAGES/DRESSINGS) ×3 IMPLANT
DRSG PAD ABDOMINAL 8X10 ST (GAUZE/BANDAGES/DRESSINGS) ×1 IMPLANT
DRSG VAC ATS LRG SENSATRAC (GAUZE/BANDAGES/DRESSINGS) IMPLANT
DRSG VAC ATS MED SENSATRAC (GAUZE/BANDAGES/DRESSINGS) IMPLANT
DRSG VAC ATS SM SENSATRAC (GAUZE/BANDAGES/DRESSINGS) IMPLANT
ELECT CAUTERY BLADE 6.4 (BLADE) ×2 IMPLANT
ELECT REM PT RETURN 9FT ADLT (ELECTROSURGICAL) ×2
ELECTRODE REM PT RTRN 9FT ADLT (ELECTROSURGICAL) ×1 IMPLANT
GLOVE BIO SURGEON STRL SZ 6.5 (GLOVE) ×2 IMPLANT
GLOVE SURG SS PI 6.5 STRL IVOR (GLOVE) ×2 IMPLANT
GLOVE SURG SS PI 7.0 STRL IVOR (GLOVE) ×1 IMPLANT
GOWN STRL REUS W/ TWL LRG LVL3 (GOWN DISPOSABLE) ×2 IMPLANT
GOWN STRL REUS W/ TWL XL LVL3 (GOWN DISPOSABLE) IMPLANT
GOWN STRL REUS W/TWL LRG LVL3 (GOWN DISPOSABLE) ×4
GOWN STRL REUS W/TWL XL LVL3 (GOWN DISPOSABLE) ×2
KIT BASIN OR (CUSTOM PROCEDURE TRAY) ×2 IMPLANT
KIT ROOM TURNOVER OR (KITS) ×2 IMPLANT
MATRIX SURGICAL PSM 7X10CM (Tissue) ×1 IMPLANT
MICROMATRIX 1000MG (Tissue) ×2 IMPLANT
MICROMATRIX 500MG (Tissue) ×6 IMPLANT
NS IRRIG 1000ML POUR BTL (IV SOLUTION) ×2 IMPLANT
PACK GENERAL/GYN (CUSTOM PROCEDURE TRAY) ×2 IMPLANT
PAD ARMBOARD 7.5X6 YLW CONV (MISCELLANEOUS) ×4 IMPLANT
SOLUTION PARTIC MCRMTRX 1000MG (Tissue) IMPLANT
SOLUTION PARTIC MCRMTRX 500MG (Tissue) IMPLANT
SPONGE GAUZE 4X4 12PLY (GAUZE/BANDAGES/DRESSINGS) ×2 IMPLANT
STAPLER VISISTAT 35W (STAPLE) ×1 IMPLANT
SURGILUBE 2OZ TUBE FLIPTOP (MISCELLANEOUS) ×1 IMPLANT
SUT VIC AB 5-0 P-3 18XBRD (SUTURE) IMPLANT
SUT VIC AB 5-0 P3 18 (SUTURE) ×8
SUT VIC AB 5-0 PS2 18 (SUTURE) ×1 IMPLANT
SWAB COLLECTION DEVICE MRSA (MISCELLANEOUS) IMPLANT
TOWEL OR 17X24 6PK STRL BLUE (TOWEL DISPOSABLE) ×2 IMPLANT
TOWEL OR 17X26 10 PK STRL BLUE (TOWEL DISPOSABLE) ×2 IMPLANT
TUBE ANAEROBIC SPECIMEN COL (MISCELLANEOUS) IMPLANT
UNDERPAD 30X30 INCONTINENT (UNDERPADS AND DIAPERS) ×2 IMPLANT
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
## 2014-01-18 NOTE — Interval H&P Note (Signed)
History and Physical Interval Note:  01/18/2014 11:12 AM  Tricia [name]  has presented today for surgery, with the diagnosis of /  The various methods of treatment have been discussed with the patient and family. After consideration of risks, benefits and other options for treatment, the patient has consented to  Procedure(s): LEFT ARM VAC CHANGE WITH PLACEMENT OF A-CELL (Left) as a surgical intervention .  The patient's history has been reviewed, patient examined, no change in status, stable for surgery.  I have reviewed the patient's chart and labs.  Questions were answered to the patient's satisfaction.     SANGER,Lyly Canizales

## 2014-01-18 NOTE — Clinical Social Work Placement (Signed)
Clinical Social Work Department CLINICAL SOCIAL WORK PLACEMENT NOTE 01/18/2014  Patient:  Tricia [name],Tricia [name]  Account Number:  [id number] Admit date:  01/01/2014  Clinical Social Worker:  Lavell LusterJOSEPH BRYANT Zyanne Schumm, LCSWA  Date/time:  01/14/2014 03:56 PM  Clinical Social Work is seeking post-discharge placement for this patient at the following level of care:   SKILLED NURSING   (*CSW will update this form in Epic as items are completed)   01/14/2014  Patient/family provided with [facility] Department of Clinical Social Work's list of facilities offering this level of care within the geographic area requested by the patient (or if unable, by the patient's family).  01/14/2014  Patient/family informed of their freedom to choose among providers that offer the needed level of care, that participate in Medicare, Medicaid or managed care program needed by the patient, have an available bed and are willing to accept the patient.  01/14/2014  Patient/family informed of [facility]' ownership interest in [facility], as well as of the fact that they are under no obligation to receive care at this facility.  PASARR submitted to EDS on 01/14/2014 PASARR number received on 01/14/2014  FL2 transmitted to all facilities in geographic area requested by pt/family on  01/14/2014 FL2 transmitted to all facilities within larger geographic area on   Patient informed that his/her managed care company has contracts with or will negotiate with  certain facilities, including the following:     Patient/family informed of bed offers received:  01/16/2014 [facility] Physician recommends and patient chooses bed at    [facility]   Patient to be transferred to facility by Ambulance Patient and family notified of transfer on  Name of family member notified:    The following physician request were entered in  Epic:   Additional Comments:   Roddie McBryant Jazlyn Tippens MSW, [place] Village[place]CSWA, [place], [phone]

## 2014-01-18 NOTE — Progress Notes (Signed)
Pt was able to answer questions appropriately, and made her mark on the surgical consent.  Pt son at bedside and also signed consent due to history of confusion at times.

## 2014-01-18 NOTE — Anesthesia Postprocedure Evaluation (Signed)
Anesthesia Post Note  Patient: Tricia BeathMargie E Blatter  Procedure(s) Performed: Procedure(s) (LRB): LEFT ARM IRRIGATION AND DRAINAGE WITH PLACEMENT OF A-CELL (Left)  Anesthesia type: General  Patient location: PACU  Post pain: Pain level controlled  Post assessment: Patient's Cardiovascular Status Stable  Last Vitals:  Filed Vitals:   01/18/14 1445  BP:   Pulse: 63  Temp:   Resp: 11    Post vital signs: Reviewed and stable  Level of consciousness: alert  Complications: No apparent anesthesia complications

## 2014-01-18 NOTE — Discharge Instructions (Signed)
KY gel to left arm adaptic daily Wrap with kerlex and an ace wrap

## 2014-01-18 NOTE — Brief Op Note (Signed)
01/01/2014 - 01/18/2014  1:36 PM  PATIENT:  Tricia [name]  80 y.o. female  PRE-OPERATIVE DIAGNOSIS:  Left Arm Wound  POST-OPERATIVE DIAGNOSIS:  Left Arm Wound  PROCEDURE:  Procedure(s): LEFT ARM VAC CHANGE WITH PLACEMENT OF A-CELL (Left)  SURGEON:  Surgeon(s) and Role:    * Brylie Sneath Sanger, DO - Primary  PHYSICIAN ASSISTANT: Shawn Rayburn, PA  ASSISTANTS: none   ANESTHESIA:   general  EBL:  Total I/O In: 250 [I.V.:250] Out: -   BLOOD ADMINISTERED:none  DRAINS: none   LOCAL MEDICATIONS USED:  NONE  SPECIMEN:  No Specimen  DISPOSITION OF SPECIMEN:  N/A  COUNTS:  YES  TOURNIQUET:  * No tourniquets in log *  DICTATION: .Dragon Dictation  PLAN OF CARE: Admit to inpatient   PATIENT DISPOSITION:  PACU - hemodynamically stable.   Delay start of Pharmacological VTE agent (>24hrs) due to surgical blood loss or risk of bleeding: no
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
## 2014-01-18 NOTE — Transfer of Care (Signed)
Immediate Anesthesia Transfer of Care Note  Patient: Tricia [name]  Procedure(s) Performed: Procedure(s): LEFT ARM IRRIGATION AND DRAINAGE WITH PLACEMENT OF A-CELL (Left)  Patient Location: PACU  Anesthesia Type:General  Level of Consciousness: awake, alert  and oriented  Airway & Oxygen Therapy: Patient Spontanous Breathing and Patient connected to nasal cannula oxygen  Post-op Assessment: Report given to PACU RN and Post -op Vital signs reviewed and stable  Post vital signs: Reviewed and stable  Complications: No apparent anesthesia complications

## 2014-01-18 NOTE — Progress Notes (Signed)
Patient ID: Tricia [name], female   DOB: 08/27/1933, 80 y.o.   MRN: [id number]   [facility] Progress Note    Assessment/ Plan:   1. AKI/CKD in setting of cellulitis and bacteremia. Suspected ATN. Remains non-oliguric and Scr has continues to decline towards her baseline. There are no acute HD needs noted at this time and volume status appears fair. Per past discussions, she stated (as an outpt) that she would not want to live on dialysis, however several days ago she said that she would proceed with dialysis. Clinically, not felt she is a suitable candidate for dialysis. Appreciate Palliative care input 1. Cellulitis/Grp A strep bacteremia- s/p surgical debridement by plastic surgery and removal of her wound vac earlier today. 2. HTN- still elevated on hydralazine and norvasc as well as coreg. No ACE/ARB due to advanced CKD.  3. Anemia- on aranesp and now with ABLA on ACDz. Transfuse prn. 4. Narcotics dependence- pt takes 8, 7.5mg  oxycodone tabs a day (15mg  QID) and has continually asked for pain meds when She is awake, hopefully palliative care can offer a med which will not put her in a stupor yet relieve her pain.  5. SHPTH- started recently on Renvela 1600 qac tid- phosphorus 6.1 from 7.3. 6. Dispo- still with intermittent confusion related to narcotics. Weak, may need SNF vs. HHC at discharge. Await palliative care input as CMO may be in her best interest given her decline in function and overall health.   Subjective:   S/p wound debridement and removal of wound vac with placement of A-cell earlier today.   Objective:   BP 154/66  Pulse 61  Temp(Src) 97.4 F (36.3 C) (Oral)  Resp 14  Ht 5\' 8"  (1.727 m)  Wt 69.355 kg (152 lb 14.4 oz)  BMI 23.25 kg/m2  SpO2 97%  Intake/Output Summary (Last 24 hours) at 01/18/14 1538 Last data filed at 01/18/14 1455  Gross per 24 hour  Intake    503 ml  Output   1825 ml  Net  -1322 ml   Weight change: 1.633 kg (3 lb 9.6  oz)  Physical Exam: Gen: Uncomfortable appearing elderly woman- son at bedside WUJ:WJXBJCVS:Pulse RRR, normal s1 and s2 Resp:coarse breath sounds bilaterally, no rales YNW:GNFAAbd:soft, flat, NT, BS normal Ext:s/p right AKA. Left arm with white blood stained dressing  Imaging: No results found.  Labs: BMET  Recent Labs Lab 01/12/14 0520 01/14/14 0420 01/15/14 0405 01/16/14 0422 01/17/14 0451 01/18/14 0440  NA 141 142 143 140 139 134*  K 3.9 4.0 3.8 3.9 3.8 3.7  CL 98 95* 96 94* 92* 90*  CO2 26 25 28 28 31 28   GLUCOSE 69* 74 71 65* 69* 92  BUN 66* 67* 69* 73* 75* 67*  CREATININE 5.16* 5.13* 5.21* 5.30* 5.18* 4.70*  CALCIUM 7.7* 8.3* 8.1* 8.4 8.5 8.0*  PHOS 9.1* 9.0* 9.1* 7.8* 7.3* 6.1*   CBC  Recent Labs Lab 01/12/14 0520 01/14/14 0420  WBC 10.4 9.3  NEUTROABS 8.0*  --   HGB 7.2* 7.3*  HCT 23.3* 23.6*  MCV 95.1 97.9  PLT 368 364    Medications:    . amLODipine  10 mg Oral Daily  . carvedilol  25 mg Oral BID WC  . darbepoetin (ARANESP) injection - NON-DIALYSIS  100 mcg Subcutaneous Q Tue-1800  . docusate sodium  100 mg Oral BID  . furosemide  80 mg Intravenous Daily  . heparin  5,000 Units Subcutaneous 3 times per day  . hydrALAZINE  200 mg Oral TID  . HYDROmorphone      . promethazine  12.5 mg Intravenous Once  . sevelamer carbonate  1,600 mg Oral TID WC  . silver sulfADIAZINE   Topical BID  . sodium bicarbonate  650 mg Oral BID  . sodium chloride  3 mL Intravenous Q12H    Zetta BillsJay Iysha Mishkin, MD 01/18/2014, 3:38 PM

## 2014-01-18 NOTE — Progress Notes (Signed)
PROGRESS NOTE  Tricia [name]:[id number] DOB: [birth year] DOA: 01/01/2014 PCP: Tricia [name],Tricia [name], Tricia [name]  HPI/Subjective:      See interim summary done 01/17/14 by Dr Jerral RalphGhimire.        No complaints today. Lethargy seemingly improved    Assessment/Plan:  Sepsis secondary to Group A strep  Blood cultures positive for Group A Strep x 2 sets Repeat blood cultures drawn on 01/02/2014 showing no growth.  Note Urine also + for >100,000 Staph (pan sensitive)  Source of infection likely to be left upper extremity cellulitis.  ID consulted recommended to c/w Ancef,till 7/4. Now off all antibiotics. Afebrile, WBC normal   Left upper extremity cellulitis  CT scan of left upper extremity showing severe diffuse changes consistent with cellulitis.  Seen by ID, initally on Clindamycin and Ancef per ID, but then just on IV Ancef-last day 7/4  Wound care, General Surgery, plastic surgery and Orthopedics consulted during her stay her  Plastic surgery Dr. Kelly SplinterSanger consulted-had debridement 01/11/14. Infection was very deep. Wound vac placed. Return to OR on 7/6. Dr. Kelly SplinterSanger predicts this will be a long process with multiple debridements.  prealbumin 8.4 indicating poor protein reserves-appreciate nutrition consultation.   Acute on Chronic Renal Failure  Patient with history of stage IV CKD, baseline creatinine near 3.4. Creatinine slowly rising. approx 5.2 on 7/3 now down to 4.7 today  Patient did not initially want HD, Now she states she wants HD. Unfortunately she has very poor vasculature and is not a good HD candidate.  Nephrology recommends Palliative consultation for goals of care appreciated, suspect will benefit from palliative care follow up upon discharge   Chronic diastolic CHF  Compensated.  Diuretics stopped on admission, but restaredt later, continue with 80 mg daily  On Coreg. No Ace-I due to ARF.  Strict I&Os   Metabolic Acidosis  Due to renal failure. Initially on Bicarb  drip-completely resolved-now on oral bicarb   Staphylococcal pyelonephritis  On Ancef AB therapy until 7/4.   Type 2 diabetes Mellitus  CBGs were borderline low, now improved off SSI. Very little po intake  Toxic metabolic encephalopathy probably 2/2 to narcotic use and uremia.  -Discontinued Fentanyl PCA. Started PRN Oxy IR and Morphine.   HTN  Moderate control , c/w clonidine,Coreg Lasix, Hydralazine, Amlodipine   Anemia Chronic disease with CKD. Continue Aranesp  Failure to thrive  Patient weak, lethargic, not really eating or drinking. Not a good HD candidate. Will try a regular diet.  If this does not change acute medical care including HD will not be of benefit to her.  Palliative consultation appreciated, suspect best served by transitioning to full comfort care status, but likely will be discharged to SNF with palliative care follow up   Disposition:  Remain inpatient-suspect SNF with palliative care follow up on 7/7   DVT Prophylaxis:  Prophylactic Heparin   Code Status:  DNR  Consultants:  ID  Plastics  surgery  Procedures:  Vac change and placement of Ancell on 01/18/14  LUE debridement 01/11/14  Antibiotics:  Anti-infectives   Start     Dose/Rate Route Frequency Ordered Stop   01/11/14 1508  polymyxin B 500,000 Units, bacitracin 50,000 Units in sodium chloride irrigation 0.9 % 500 mL irrigation  Status:  Discontinued       As needed 01/11/14 1508 01/11/14 1549   01/06/14 2200  clindamycin (CLEOCIN) IVPB 600 mg  Status:  Discontinued     600 mg 100 mL/hr over 30 Minutes Intravenous 3  times per day 01/06/14 2014 01/11/14 0957   01/04/14 1400  clindamycin (CLEOCIN) IVPB 600 mg  Status:  Discontinued     600 mg 100 mL/hr over 30 Minutes Intravenous 3 times per day 01/04/14 1309 01/06/14 1710   01/04/14 1400  ceFAZolin (ANCEF) IVPB 1 g/50 mL premix  Status:  Discontinued     1 g 100 mL/hr over 30 Minutes Intravenous Every 24 hours 01/04/14 1309 01/17/14  0900   01/03/14 2200  vancomycin (VANCOCIN) IVPB 1000 mg/200 mL premix  Status:  Discontinued     1,000 mg 200 mL/hr over 60 Minutes Intravenous Every 48 hours 01/01/14 1910 01/04/14 1309   01/03/14 1600  vancomycin (VANCOCIN) IVPB 1000 mg/200 mL premix  Status:  Discontinued     1,000 mg 200 mL/hr over 60 Minutes Intravenous Every 48 hours 01/01/14 1513 01/01/14 1903   01/01/14 2359  piperacillin-tazobactam (ZOSYN) IVPB 2.25 g  Status:  Discontinued     2.25 g 100 mL/hr over 30 Minutes Intravenous 4 times per day 01/01/14 1729 01/04/14 1309   01/01/14 1730  piperacillin-tazobactam (ZOSYN) IVPB 3.375 g  Status:  Discontinued     3.375 g 100 mL/hr over 30 Minutes Intravenous  Once 01/01/14 1729 01/04/14 1439   01/01/14 1515  vancomycin (VANCOCIN) 1,500 mg in sodium chloride 0.9 % 500 mL IVPB     1,500 mg 250 mL/hr over 120 Minutes Intravenous  Once 01/01/14 1513 01/03/14 0700   01/01/14 1445  cefTRIAXone (ROCEPHIN) 1 g in dextrose 5 % 50 mL IVPB     1 g 100 mL/hr over 30 Minutes Intravenous  Once 01/01/14 1443 01/01/14 1544      Objective: Filed Vitals:   01/18/14 0540 01/18/14 0756 01/18/14 1050 01/18/14 1052  BP: 161/58 159/57 154/56 154/56  Pulse: 71 71    Temp: 98.8 F (37.1 C)     TempSrc: Oral     Resp: 16     Height:      Weight: 69.355 kg (152 lb 14.4 oz)     SpO2: 91%       Intake/Output Summary (Last 24 hours) at 01/18/14 1234 Last data filed at 01/18/14 0544  Gross per 24 hour  Intake    303 ml  Output   1500 ml  Net  -1197 ml   Filed Weights   01/16/14 0433 01/17/14 0512 01/18/14 0540  Weight: 67.586 kg (149 lb) 67.722 kg (149 lb 4.8 oz) 69.355 kg (152 lb 14.4 oz)    Exam: General: chronically ill appearing in NAD CV: S1S2 RRR, no m/r/g Resp: Diminished LL, but CTAB, no w/r/c GI: abdomen soft, NT/ND, BS+ Ext: LUE with dressing in place/wound vac Neuro: no focal neuro deficits Psych: flat affect   Data Reviewed: Basic Metabolic Panel:  Recent  Labs Lab 01/14/14 0420 01/15/14 0405 01/16/14 0422 01/17/14 0451 01/18/14 0440  NA 142 143 140 139 134*  K 4.0 3.8 3.9 3.8 3.7  CL 95* 96 94* 92* 90*  CO2 25 28 28 31 28   GLUCOSE 74 71 65* 69* 92  BUN 67* 69* 73* 75* 67*  CREATININE 5.13* 5.21* 5.30* 5.18* 4.70*  CALCIUM 8.3* 8.1* 8.4 8.5 8.0*  PHOS 9.0* 9.1* 7.8* 7.3* 6.1*   Liver Function Tests:  Recent Labs Lab 01/14/14 0420 01/15/14 0405 01/16/14 0422 01/17/14 0451 01/18/14 0440  ALBUMIN 2.3* 2.3* 2.3* 2.4* 2.2*   No results found for this basename: LIPASE, AMYLASE,  in the last 168 hours No results found for this  basename: AMMONIA,  in the last 168 hours CBC:  Recent Labs Lab 01/12/14 0520 01/14/14 0420  WBC 10.4 9.3  NEUTROABS 8.0*  --   HGB 7.2* 7.3*  HCT 23.3* 23.6*  MCV 95.1 97.9  PLT 368 364    CBG:  Recent Labs Lab 01/17/14 1149 01/17/14 1702 01/17/14 2124 01/18/14 0801 01/18/14 1208  GLUCAP 106* 91 83 96 111*    No results found for this or any previous visit (from the past 240 hour(s)).   Studies: No results found.  Scheduled Meds: . [MAR HOLD] amLODipine  10 mg Oral Daily  . [facility] HOLD] carvedilol  25 mg Oral BID WC  . [MAR HOLD] darbepoetin (ARANESP) injection - NON-DIALYSIS  100 mcg Subcutaneous Q Tue-1800  . [facility] HOLD] docusate sodium  100 mg Oral BID  . [facility] HOLD] furosemide  80 mg Intravenous Daily  . [MAR HOLD] heparin  5,000 Units Subcutaneous 3 times per day  . [facility] HOLD] hydrALAZINE  200 mg Oral TID  . [facility] HOLD] promethazine  12.5 mg Intravenous Once  . [facility] HOLD] sevelamer carbonate  1,600 mg Oral TID WC  . [facility] HOLD] silver sulfADIAZINE   Topical BID  . [facility] HOLD] sodium bicarbonate  650 mg Oral BID  . [facility] HOLD] sodium chloride  3 mL Intravenous Q12H   Continuous Infusions: . sodium chloride 25 mL/hr (01/11/14 1355)  . sodium chloride 10 mL/hr at 01/18/14 1222  . dextrose 5 % and 0.45% NaCl 75 mL/hr at 01/17/14 0600    Principal Problem:   Cellulitis Active  Problems:   Hypertensive heart disease   Tobacco dependency   Chronic diastolic CHF (congestive heart failure)   Sepsis   Acute on chronic renal failure   Diabetes mellitus with peripheral vascular disease   Arm wound   Streptococcal bacteremia   UTI (urinary tract infection)   Palliative care encounter    Cordelia Pen, NP-C [facility]  pgr [phone]  If 7PM-7AM, please contact night-coverage at [email or web address], password [facility] 01/18/2014, 12:34 PM  LOS: 17 days   Attending Patient was seen, examined,treatment plan was discussed with the Physician extender. I have directly reviewed the clinical findings, lab, imaging studies and management of this patient in detail. I have made the necessary changes to the above noted documentation, and agree with the documentation, as recorded by the Physician extender.  Windell Norfolk Tricia [name] [facility].

## 2014-01-18 NOTE — Anesthesia Preprocedure Evaluation (Signed)
Anesthesia Evaluation  Patient identified by MRN, date of birth, ID band Patient awake    Reviewed: Allergy & Precautions, H&P , NPO status , Patient's Chart, lab work & pertinent test results, reviewed documented beta blocker date and time   Airway Mallampati: II TM Distance: >3 FB Neck ROM: full    Dental   Pulmonary Current Smoker,  breath sounds clear to auscultation        Cardiovascular hypertension, On Medications and On Home Beta Blockers + CAD, + Past MI, + CABG, + Peripheral Vascular Disease and +CHF + Valvular Problems/Murmurs AS Rhythm:regular     Neuro/Psych PSYCHIATRIC DISORDERS negative neurological ROS     GI/Hepatic negative GI ROS, Neg liver ROS,   Endo/Other  diabetes, Oral Hypoglycemic Agents  Renal/GU CRFRenal disease  negative genitourinary   Musculoskeletal   Abdominal   Peds  Hematology negative hematology ROS (+)   Anesthesia Other Findings See surgeon's H&P   Reproductive/Obstetrics negative OB ROS                           Anesthesia Physical Anesthesia Plan  ASA: III  Anesthesia Plan: General   Post-op Pain Management:    Induction: Intravenous  Airway Management Planned: LMA  Additional Equipment:   Intra-op Plan:   Post-operative Plan:   Informed Consent: I have reviewed the patients History and Physical, chart, labs and discussed the procedure including the risks, benefits and alternatives for the proposed anesthesia with the patient or authorized representative who has indicated his/her understanding and acceptance.   Dental Advisory Given  Plan Discussed with: CRNA and Surgeon  Anesthesia Plan Comments:         Anesthesia Quick Evaluation

## 2014-01-18 NOTE — Anesthesia Procedure Notes (Signed)
Procedure Name: LMA Insertion Date/Time: 01/18/2014 12:50 PM Performed by: Leonel Ramsay'LAUGHLIN, Reggie Welge H Pre-anesthesia Checklist: Patient identified, Timeout performed, Emergency Drugs available, Suction available and Patient being monitored Patient Re-evaluated:Patient Re-evaluated prior to inductionOxygen Delivery Method: Circle system utilized Preoxygenation: Pre-oxygenation with 100% oxygen Intubation Type: IV induction LMA: LMA inserted LMA Size: 4.0 Number of attempts: 1 Placement Confirmation: positive ETCO2 and breath sounds checked- equal and bilateral Tube secured with: Tape Dental Injury: Teeth and Oropharynx as per pre-operative assessment

## 2014-01-19 LAB — RENAL FUNCTION PANEL
Albumin: 2.3 g/dL — ABNORMAL LOW (ref 3.5–5.2)
Anion gap: 13 (ref 5–15)
BUN: 64 mg/dL — ABNORMAL HIGH (ref 6–23)
CO2: 32 meq/L (ref 19–32)
CREATININE: 4.59 mg/dL — AB (ref 0.50–1.10)
Calcium: 8.1 mg/dL — ABNORMAL LOW (ref 8.4–10.5)
Chloride: 90 mEq/L — ABNORMAL LOW (ref 96–112)
GFR calc Af Amer: 10 mL/min — ABNORMAL LOW (ref >90)
GFR calc non Af Amer: 8 mL/min — ABNORMAL LOW (ref >90)
Glucose, Bld: 95 mg/dL (ref 70–99)
Phosphorus: 6.4 mg/dL — ABNORMAL HIGH (ref 2.3–4.6)
Potassium: 3.5 mEq/L — ABNORMAL LOW (ref 3.7–5.3)
Sodium: 135 mEq/L — ABNORMAL LOW (ref 137–147)

## 2014-01-19 LAB — GLUCOSE, CAPILLARY: Glucose-Capillary: 96 mg/dL (ref 70–99)

## 2014-01-19 MED ORDER — POLYETHYLENE GLYCOL 3350 17 G PO PACK: 17.0000 g | PACK | Freq: Every day | ORAL | Status: AC | PRN

## 2014-01-19 MED ORDER — SEVELAMER CARBONATE 800 MG PO TABS: 1600.0000 mg | ORAL_TABLET | Freq: Three times a day (TID) | ORAL | Status: AC

## 2014-01-19 MED ORDER — DSS 100 MG PO CAPS: 100.0000 mg | ORAL_CAPSULE | Freq: Two times a day (BID) | ORAL | Status: AC

## 2014-01-19 MED ORDER — OXYCODONE HCL 5 MG PO TABS: 5.0000 mg | ORAL_TABLET | ORAL | Status: AC | PRN

## 2014-01-19 MED ORDER — CARVEDILOL 25 MG PO TABS
25.0000 mg | ORAL_TABLET | Freq: Two times a day (BID) | ORAL | Status: AC
Start: 2014-01-19 — End: ?

## 2014-01-19 MED ORDER — HYDRALAZINE HCL 50 MG PO TABS: 200.0000 mg | ORAL_TABLET | Freq: Three times a day (TID) | ORAL | Status: AC

## 2014-01-19 MED ORDER — DOXAZOSIN MESYLATE 2 MG PO TABS
2.0000 mg | ORAL_TABLET | Freq: Every day | ORAL | Status: DC
  Filled 2014-01-19: qty 1

## 2014-01-19 MED ORDER — CLONAZEPAM 2 MG PO TABS: 2.0000 mg | ORAL_TABLET | Freq: Two times a day (BID) | ORAL | Status: AC

## 2014-01-19 MED ORDER — AMLODIPINE BESYLATE 10 MG PO TABS
10.0000 mg | ORAL_TABLET | Freq: Every day | ORAL | Status: AC
Start: 2014-01-19 — End: ?

## 2014-01-19 MED ORDER — SODIUM BICARBONATE 650 MG PO TABS: 650.0000 mg | ORAL_TABLET | Freq: Two times a day (BID) | ORAL | Status: AC

## 2014-01-19 MED ORDER — DOXAZOSIN MESYLATE 2 MG PO TABS: 2.0000 mg | ORAL_TABLET | Freq: Every day | ORAL | Status: AC

## 2014-01-19 MED ORDER — DARBEPOETIN ALFA-POLYSORBATE 100 MCG/0.5ML IJ SOLN: 100.0000 ug | INTRAMUSCULAR | Status: AC

## 2014-01-19 NOTE — Progress Notes (Signed)
Patient ID: Ursula BeathMargie E Logan, female   DOB: October 27, 1933, 80 y.o.   MRN: [id number]  [facility] Progress Note    Assessment/ Plan:   1. AKI on CKD4 in setting of cellulitis and bacteremia. Suspected ATN. Remains non-oliguric and labs are pending from this morning to assess renal recovery. Clinically, there are no acute HD needs noted at this time and volume status appears fair. She does not desire chronic hemodialysis-for which he appears to be a marginal candidate at this time. Appreciate Palliative care input  2. Cellulitis/Grp A strep bacteremia- s/p surgical debridement by plastic surgery and removal of her wound vac yesterday. She is on Ancef and getting aggressive wound care 3. HTN- intermittently elevated on hydralazine, amlodipine and carvedilol-also on daily furosemide. Will add doxazosin each bedtime. 4. Anemia- on aranesp and now with ABLA on ACDz. Transfuse prn. 5. Narcotics dependence- pt takes 8, 7.5mg  oxycodone tabs a day (15mg  QID) and has continually asked for pain meds when She is awake, hopefully palliative care can offer a med which will not put her in a stupor yet relieve her pain. 6. SHPTH- started recently on Renvela 1600 qac tid- phosphorus 6.1 from 7.3. 7. Dispo- still with intermittent confusion related to narcotics. Weak, may need SNF vs. HHC at discharge.   Subjective:   No acute events noted overnight-continues to have intermittent left arm pain    Objective:   BP 160/56  Pulse 66  Temp(Src) 98 F (36.7 C) (Oral)  Resp 15  Ht 5\' 8"  (1.727 m)  Wt 67.541 kg (148 lb 14.4 oz)  BMI 22.65 kg/m2  SpO2 90%  Intake/Output Summary (Last 24 hours) at 01/19/14 0902 Last data filed at 01/19/14 0555  Gross per 24 hour  Intake    350 ml  Output    975 ml  Net   -625 ml   Weight change: -1.814 kg (-4 lb)  Physical Exam: Gen: Resting comfortably in bed, eating breakfast CVS: Pulse regular in rate and rhythm, S1 and S2 with ESM Resp: Coarse breath sounds  bilaterally-no rales/rhonchi Abd: Soft, flat, nontender and bowel sounds are normal Ext: One plus lower extremity edema, left arm in a bloodstained dressing.  Imaging: No results found.  Labs: BMET  Recent Labs Lab 01/14/14 0420 01/15/14 0405 01/16/14 0422 01/17/14 0451 01/18/14 0440  NA 142 143 140 139 134*  K 4.0 3.8 3.9 3.8 3.7  CL 95* 96 94* 92* 90*  CO2 25 28 28 31 28   GLUCOSE 74 71 65* 69* 92  BUN 67* 69* 73* 75* 67*  CREATININE 5.13* 5.21* 5.30* 5.18* 4.70*  CALCIUM 8.3* 8.1* 8.4 8.5 8.0*  PHOS 9.0* 9.1* 7.8* 7.3* 6.1*   CBC  Recent Labs Lab 01/14/14 0420  WBC 9.3  HGB 7.3*  HCT 23.6*  MCV 97.9  PLT 364   Medications:    . amLODipine  10 mg Oral Daily  . carvedilol  25 mg Oral BID WC  . darbepoetin (ARANESP) injection - NON-DIALYSIS  100 mcg Subcutaneous Q Tue-1800  . docusate sodium  100 mg Oral BID  . furosemide  80 mg Intravenous Daily  . heparin  5,000 Units Subcutaneous 3 times per day  . hydrALAZINE  200 mg Oral TID  . promethazine  12.5 mg Intravenous Once  . sevelamer carbonate  1,600 mg Oral TID WC  . silver sulfADIAZINE   Topical BID  . sodium bicarbonate  650 mg Oral BID  . sodium chloride  3 mL Intravenous  Q12H   Zetta BillsJay Nury Nebergall, MD 01/19/2014, 9:02 AM

## 2014-01-19 NOTE — Progress Notes (Signed)
NUTRITION FOLLOW-UP  DOCUMENTATION CODES Per approved criteria  -Severe malnutrition in the context of acute illness   INTERVENTION: Pt is refusing all oral nutrition supplements presently. Per palliative care note on 7/7 - no feeding tube, per patient. If any further nutrition interventions desired, please consult RD. RD to continue to follow nutrition care plan.  NUTRITION DIAGNOSIS: Inadequate oral intake related to poor appetite as evidenced by son report - ongoing.  Goal: Pt to meet >/= 90% of their estimated nutrition needs - unmet.  Monitor:  PO & supplemental intake, weight, labs, I/O's, GOC  ASSESSMENT: 80 y.o. female with a history of multiple comorbidities including DM, peripheral vascular disease, status post right AKA, stage IV CKD, chronic diastolic CHF; presented to the ER with complaints of generalized weakness, feeling poorly and left upper extremity swelling.   Pt with severe cellulitis of L arm, underwent I&D of arm 6/29 with application of wound VAC. Wound VAC removed 7/6.  Renal continues to follow patient. Per MD, she is not a candidate for HD and did not required HD.  Patient continues to eat very little. Has refused all oral nutrition supplements.  Pt meets criteria for severe MALNUTRITION in the context of acute illness as evidenced by 5% wt loss x < 1 month and intake of <50% x at least 5 days.  Prealbumin is low at 8.4 Phosphorus elevated at 6.4 --> ordered for renvela TID Potassium low at 3.5  Height: Ht Readings from Last 1 Encounters:  01/01/14 5\' 8"  (1.727 m)    Weight: Wt Readings from Last 1 Encounters:  01/19/14 148 lb 14.4 oz (67.541 kg)  Admit wt 156 lb   Estimated Nutritional Needs: Kcal: 1800-2000 Protein: 65-75 gm Fluid: per MD  Skin:  Stage II pressure ulcer to sacrum Arm incisions  Diet Order: General   Intake/Output Summary (Last 24 hours) at 01/19/14 1132 Last data filed at 01/19/14 1026  Gross per 24 hour   Intake    470 ml  Output   1225 ml  Net   -755 ml    Last BM: 7/6  Labs:   Recent Labs Lab 01/17/14 0451 01/18/14 0440 01/19/14 0840  NA 139 134* 135*  K 3.8 3.7 3.5*  CL 92* 90* 90*  CO2 31 28 32  BUN 75* 67* 64*  CREATININE 5.18* 4.70* 4.59*  CALCIUM 8.5 8.0* 8.1*  PHOS 7.3* 6.1* 6.4*  GLUCOSE 69* 92 95    CBG (last 3)   Recent Labs  01/18/14 1655 01/18/14 2125 01/19/14 0801  GLUCAP 90 114* 96   Prealbumin  Date/Time Value Ref Range Status  01/07/2014  2:05 PM 8.4* 17.0 - 34.0 mg/dL Final     Performed at [facility]     Scheduled Meds: . amLODipine  10 mg Oral Daily  . carvedilol  25 mg Oral BID WC  . darbepoetin (ARANESP) injection - NON-DIALYSIS  100 mcg Subcutaneous Q Tue-1800  . docusate sodium  100 mg Oral BID  . doxazosin  2 mg Oral QHS  . furosemide  80 mg Intravenous Daily  . heparin  5,000 Units Subcutaneous 3 times per day  . hydrALAZINE  200 mg Oral TID  . promethazine  12.5 mg Intravenous Once  . sevelamer carbonate  1,600 mg Oral TID WC  . silver sulfADIAZINE   Topical BID  . sodium bicarbonate  650 mg Oral BID  . sodium chloride  3 mL Intravenous Q12H    Continuous Infusions: . dextrose 5 %  and 0.45% NaCl 75 mL/hr (01/19/14 0829)    Jarold MottoSamantha Shandrika Ambers MS, RD, LDN Inpatient Registered Dietitian Pager: [phone] After-hours pager: [phone]

## 2014-01-19 NOTE — Progress Notes (Signed)
PT Cancellation Note  Patient Details Name: Tricia BeathMargie E [name] MRN: [id number] DOB: [birth year]   Cancelled Treatment:    Reason Eval/Treat Not Completed: Other (comment) (RN reports she is currently dressing pt to get her ready to be d/ced to SNF.)    Alvie HeidelbergFolan, Shaila Gilchrest A 01/19/2014, 2:21 PM Alvie HeidelbergShauna Folan, PT, DPT [phone]

## 2014-01-19 NOTE — Progress Notes (Signed)
Progress Note from the Palliative Medicine Team at [facility]  Subjective: I met with Tricia [name] this morning and she is much more awake and alert than previously. Her pain is much better controlled today. She does tell me today that "I do not think I want to go through all of that [dialysis] and I think I just want to be comfortable." She confirms DNR, no feeding tube. I also addressed rehospitalization and she tells me "I'm not sure I could go through that again." We discussed the option of hospice at home to keep her comfortable. She needs to think and discuss more with her family if she would want to come back to the hospital with further infection, etc. I did attempt to reach family today with no success to discuss what [name] told me today.    Objective: No Known Allergies Scheduled Meds: . amLODipine  10 mg Oral Daily  . carvedilol  25 mg Oral BID WC  . darbepoetin (ARANESP) injection - NON-DIALYSIS  100 mcg Subcutaneous Q Tue-1800  . docusate sodium  100 mg Oral BID  . furosemide  80 mg Intravenous Daily  . heparin  5,000 Units Subcutaneous 3 times per day  . hydrALAZINE  200 mg Oral TID  . promethazine  12.5 mg Intravenous Once  . sevelamer carbonate  1,600 mg Oral TID WC  . silver sulfADIAZINE   Topical BID  . sodium bicarbonate  650 mg Oral BID  . sodium chloride  3 mL Intravenous Q12H   Continuous Infusions: . dextrose 5 % and 0.45% NaCl 75 mL/hr (01/19/14 0829)   PRN Meds:.acetaminophen, acetaminophen, cloNIDine, morphine injection, ondansetron (ZOFRAN) IV, ondansetron, oxyCODONE, oxyCODONE-acetaminophen, polyethylene glycol, sorbitol  BP 160/56  Pulse 66  Temp(Src) 98 F (36.7 C) (Oral)  Resp 15  Ht _0  (1.727 m)  Wt 67.541 kg (148 lb 14.4 oz)  BMI 22.65 kg/m2  SpO2 90%   PPS: 30%  Pain Score: much improved, denies right now    Intake/Output Summary (Last 24 hours) at 01/19/14 0903 Last data filed at 01/19/14 0555  Gross per 24 hour  Intake    350 ml   Output    975 ml  Net   -625 ml      LBM: 01/18/14      Physical Exam:  General: NAD, resting, weak, ill appearing  HEENT: [place]/AT, temporal muscle wasting, no JVD, moist mucosa  Chest: No labored breathing, symmetric  CVS: RRR, S1 S2  Abdomen: Soft, NT, ND, +BS  Ext: MAE, 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, no edema, warm to touch, left arm dressing intact with shadowing and old drainage  Neuro: Alert, oriented x 3, follows commands   Labs: CBC    Component Value Date/Time   WBC 9.3 01/14/2014 0420   RBC 2.41* 01/14/2014 0420   HGB 7.3* 01/14/2014 0420   HCT 23.6* 01/14/2014 0420   PLT 364 01/14/2014 0420   MCV 97.9 01/14/2014 0420   MCH 30.3 01/14/2014 0420   MCHC 30.9 01/14/2014 0420   RDW 15.9* 01/14/2014 0420   LYMPHSABS 1.4 01/12/2014 0520   MONOABS 0.9 01/12/2014 0520   EOSABS 0.1 01/12/2014 0520   BASOSABS 0.1 01/12/2014 0520    BMET    Component Value Date/Time   NA 134* 01/18/2014 0440   K 3.7 01/18/2014 0440   CL 90* 01/18/2014 0440   CO2 28 01/18/2014 0440   GLUCOSE 92 01/18/2014 0440   BUN 67* 01/18/2014 0440   CREATININE 4.70* 01/18/2014 0440  CALCIUM 8.0* 01/18/2014 0440   GFRNONAA 8* 01/18/2014 0440   GFRAA 9* 01/18/2014 0440    CMP     Component Value Date/Time   NA 134* 01/18/2014 0440   K 3.7 01/18/2014 0440   CL 90* 01/18/2014 0440   CO2 28 01/18/2014 0440   GLUCOSE 92 01/18/2014 0440   BUN 67* 01/18/2014 0440   CREATININE 4.70* 01/18/2014 0440   CALCIUM 8.0* 01/18/2014 0440   PROT 5.8* 01/02/2014 0316   ALBUMIN 2.2* 01/18/2014 0440   AST 34 01/02/2014 0316   ALT 12 01/02/2014 0316   ALKPHOS 40 01/02/2014 0316   BILITOT 0.2* 01/02/2014 0316   GFRNONAA 8* 01/18/2014 0440   GFRAA 9* 01/18/2014 0440     Assessment and Plan:  1. Code Status: DNR 2. Symptom Control:  1. Pain: Oxycodone-acetaminophen 10-325 mg every 4 hours. Morphine to 1 mg IV every 4 hours prn.  2. Fever: Acetaminophen prn. 3. Heartburn: Maalox prn. 4. Bowel Regimen: Colace scheduled. Miralax daily prn.  5. Nausea: Ondansetron prn.   3. Psycho/Social: Emotional support provided to patient and family at bedside.  4. Disposition: SNF rehab. Would benefit from palliative services but none available to serve in this area. Consider hospice for future home care.      Time In Time Out Total Time Spent with Patient Total Overall Time  0900 0930 25mn 386m    Greater than 50%  of this time was spent counseling and coordinating care related to the above assessment and plan.  AlVinie SillNP Palliative Medicine Team Pager # [phone]-F 8a-5p) Team Phone # [phone]/Weekends)

## 2014-01-19 NOTE — Discharge Summary (Addendum)
Physician Discharge Summary  Tricia [name] AVW:[id number] DOB: Dec 21, 1933 DOA: 01/01/2014  PCP: Kaleen Mask, MD  Admit date: 01/01/2014 Discharge date: 01/19/2014  Time spent: 40 minutes  Recommendations for Outpatient Follow-up:  1. Discharge to [facility] 2. Please arrange follow-up appt Dr Wayland Denis for 2 weeks 3. Wound care as described below 4. Please obtain palliative care consult while at SNF  Discharge Diagnoses:  Principal Problem:   Cellulitis Active Problems:   Hypertensive heart disease   Tobacco dependency   Chronic diastolic CHF (congestive heart failure)   Sepsis   Acute on chronic renal failure   Diabetes mellitus with peripheral vascular disease   Arm wound   Streptococcal bacteremia   UTI (urinary tract infection)   Palliative care encounter   Discharge Condition: stable  Diet recommendation: renal diet  Filed Weights   01/17/14 0512 01/18/14 0540 01/19/14 0548  Weight: 67.722 kg (149 lb 4.8 oz) 69.355 kg (152 lb 14.4 oz) 67.541 kg (148 lb 14.4 oz)    History of present illness:      80 year old ?, known ty 2 diabetes mellitus, peripheral vascular disease, 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 She presented with complaints of left upper extremity pain, swelling, erythema, having an associated functional decline, generalized weakness, feeling ill. Patient found to have severe left upper extremity cellulitis with leukopenia and bandemia.She also developed acute on chronic renal failure.Given severity of presentation a CT scan of her left upper extremity was obtained in the emergency room which revealed severe diffuse edematous changes consistent with severe cellulitis. She was initially started on broad-spectrum IV antibiotic therapy with vancomycin and Zosyn. Blood cultures obtained on  01/01/2014, back positive for group A streptococcus in both sets.Repeat blood cultures were obtained and show no growth to date.       Patient was seen in consult by Orthopedics and Plastics, underwent excision and debridement of the left arm with VAC placement on 6/29. Patient was also seen in consult by ID-Dr Snider,antibiotics were narrowed to Clinda/Ancef, and then only to Ancef, last dose was on 01/16/14. Wound vac was removed on 01/18/14.      Unfortunately, patient continues to have significantly worsening renal function, creatinine is now persistently around 5, is being followed by Nephrology, and thought not to a great candidate for Hemodialysis. Patient has also been seen by Palliative care, now a DNR.   Hospital Course:   Sepsis secondary to Group A strep  Blood cultures positive for Group A Strep x 2 sets Repeat blood cultures drawn on 01/02/2014 showing no growth.  Note Urine also + for >100,000 Staph (pan sensitive)  Source of infection likely to be left upper extremity cellulitis.  ID consulted recommended to c/w Ancef,till 7/4. Now off all antibiotics. Afebrile, WBC normal   Left upper extremity cellulitis  CT scan of left upper extremity showing severe diffuse changes consistent with cellulitis.  Seen by ID, initally on Clindamycin and Ancef per ID, but then just on IV Ancef-last day 7/4  Wound care, General Surgery, plastic surgery and Orthopedics consulted during her stay her  Plastic surgery Dr. Kelly Splinter consulted-had debridement 01/11/14. Infection was very deep. Wound vac placed and then removed on 7/6. prealbumin 8.4 indicating poor protein reserves-appreciate nutrition consultation.  **wound care to involve application of surgical lubricant to adaptic once daily 2 week follow up with Dr. Kelly Splinter as above  AKI on CKD4  Patient with history of stage IV CKD, baseline creatinine near 3.4. Creatinine slowly rising. approx 5.2 on 7/3 now down to 4.7 on 4/6.  Patient did not initially  want HD, Now she states she wants HD. Unfortunately she has very poor vasculature and is not a good HD candidate.  DNR  Chronic diastolic CHF  Compensated.  Diuretics stopped on admission, but restartedt later, continue with 80 mg daily  On Coreg. No Ace-I due to ARF.   Metabolic Acidosis  Due to renal failure. Initially on Bicarb drip-completely resolved-now on oral bicarb   Staphylococcal pyelonephritis  On Ancef AB therapy until 7/4.   Type 2 diabetes Mellitus  CBGs were borderline low, now improved off SSI. Very little po intake   Toxic metabolic encephalopathy probably 2/2 to narcotic use and uremia.  -Discontinued Fentanyl PCA. Started PRN Oxy IR and Morphine.   HTN  Moderate control , c/w clonidine,Coreg Lasix, Hydralazine, Amlodipine   Anemia  Chronic disease with CKD. Continue Aranesp   Failure to thrive   Patient weak, lethargic, not eating or drinking well. Not a good HD candidate. Will try a regular diet.  If this does not change acute medical care including HD will not be of benefit to her. Pt has expressed that she does not care for recurrent hospitalizations. She would likely benefit from hospice referral should she continue to decline. Palliative consultation appreciated. Will continue symptom control as recommended    Procedures: Vac change and placement of Ancell on 01/18/14 LUE debridement 01/11/14  Consultations:  ID  Plastics  Nephrology  Discharge Exam:  General: more alert today, in NAD Cardiovascular: S1S2 c SEM Respiratory: CTAB, no w/r/c, no increased wob GI: abdomen soft, NT/ND, BS+ Ext: LUE dressing c/d/i. 2+ radial pulse Psych: flat affect  Discharge Instructions You were cared for by a hospitalist during your hospital stay. If you have any questions about your discharge medications or the care you received while you were in the hospital after you are discharged, you can call the unit and asked to speak with the hospitalist on call if  the hospitalist that took care of you is not available. Once you are discharged, your primary care physician will handle any further medical issues. Please note that NO REFILLS for any discharge medications will be authorized once you are discharged, as it is imperative that you return to your primary care physician (or establish a relationship with a primary care physician if you do not have one) for your aftercare needs so that they can reassess your need for medications and monitor your lab values.     Medication List    STOP taking these medications       labetalol 200 MG tablet  Commonly known as:  NORMODYNE     propranolol 80 MG tablet  Commonly known as:  INDERAL      TAKE these medications       amLODipine 10 MG tablet  Commonly known as:  NORVASC  Take 1 tablet (10 mg total) by mouth daily.     aspirin 81 MG EC tablet  Take 1 tablet (81 mg total) by mouth daily.     calcitRIOL 0.25 MCG capsule  Commonly known as:  ROCALTROL  Take 0.25 mcg by mouth 3 (three) times a week. On Monday, Wednesday, and Friday     carvedilol 25 MG tablet  Commonly known as:  COREG  Take 1 tablet (25 mg total) by mouth 2 (two) times  daily with a meal.     clonazePAM 2 MG tablet  Commonly known as:  KLONOPIN  Take 1 tablet (2 mg total) by mouth 2 (two) times daily.     darbepoetin 100 MCG/0.5ML Soln injection  Commonly known as:  ARANESP  Inject 0.5 mLs (100 mcg total) into the skin every Tuesday at 6 PM.     doxazosin 2 MG tablet  Commonly known as:  CARDURA  Take 1 tablet (2 mg total) by mouth at bedtime.     DSS 100 MG Caps  Take 100 mg by mouth 2 (two) times daily.     furosemide 40 MG tablet  Commonly known as:  LASIX  Take 1 tablet (40 mg total) by mouth 2 (two) times daily.     hydrALAZINE 50 MG tablet  Commonly known as:  APRESOLINE  Take 4 tablets (200 mg total) by mouth 3 (three) times daily.     HYDROcodone-acetaminophen 7.5-325 MG per tablet  Commonly known as:   NORCO  Take 2 tablets by mouth 4 (four) times daily -  with meals and at bedtime.     oxyCODONE 5 MG immediate release tablet  Commonly known as:  Oxy IR/ROXICODONE  Take 1 tablet (5 mg total) by mouth every 4 (four) hours as needed for moderate pain or severe pain.     polyethylene glycol packet  Commonly known as:  MIRALAX / GLYCOLAX  Take 17 g by mouth daily as needed for moderate constipation.     sevelamer carbonate 800 MG tablet  Commonly known as:  RENVELA  Take 2 tablets (1,600 mg total) by mouth 3 (three) times daily with meals.     sodium bicarbonate 650 MG tablet  Take 1 tablet (650 mg total) by mouth 2 (two) times daily.     Trospium Chloride 60 MG Cp24  Take 60 mg by mouth daily.     vitamin B-12 50 MCG tablet  Commonly known as:  CYANOCOBALAMIN  Take 50 mcg by mouth daily.       No Known Allergies Follow-up Information   Follow up with SANGER,CLAIRE, DO In 2 weeks.   Specialty:  Plastic Surgery   Contact information:   [address] [place] [address] [phone]       Follow up with Irena Cords, MD On 02/23/2014. (appointment at 1:30pm (as previously scheduled))    Specialty:  Nephrology   Contact information:   [name] [place] [address] [phone]        The results of significant diagnostics from this hospitalization (including imaging, microbiology, ancillary and laboratory) are listed below for reference.    Significant Diagnostic Studies: Dg Forearm Left  01/01/2014   CLINICAL DATA:  Redness and pain with swelling question with draining wounds over the left forearm  EXAM: LEFT FOREARM - 2 VIEW  COMPARISON:  None.  FINDINGS: A single AP view of the forearm reveals a numerous vascular clips throughout the forearm. There is heterogeneous density within the lateral aspect of the forearm soft tissues. No definite gas collections are demonstrated. The bones exhibit no acute abnormality.  IMPRESSION: There is no acute bony  abnormality of the forearm. There is heterogeneous soft tissue density which may reflect cellulitis.   Electronically Signed   By: David  [place]   On: 01/01/2014 12:29   Ct Forearm Left Wo Contrast  01/01/2014   CLINICAL DATA:  Extensive swelling, cellulitis, severe pain left forearm, patient reports symptoms began 24-36 hours ago with  weeping from the affected are, denies trauma, reporting features and chills  EXAM: CT OF THE LEFT FOREARM WITHOUT CONTRAST  TECHNIQUE: Multidetector CT imaging was performed according to the standard protocol. Multiplanar CT image reconstructions were also generated.  COMPARISON:  01/01/14 radiograph  FINDINGS: There are numerous vascular clips along the volar surface of the forearm beginning just distal to the elbow joint and extending along the volar/radial surface all the way to the level of the wrist. There is diffuse thickening of the dermis circumferentially. There is reticular edematous change in the subcutaneous soft tissues circumferentially, of moderate severity diffusely, but particularly severe along the dorsal surface. Edematous changes extend into the hand. There is no evidence of focal organized fluid collection to suggest abscess. There is no focal osseous abnormalities to suggest osteomyelitis.  IMPRESSION: Severe diffuse edematous change consistent with severe cellulitis.   Electronically Signed   By: Esperanza Heiraymond  Rubner M.D.   On: 01/01/2014 18:25   Dg Chest Port 1 View  01/01/2014   CLINICAL DATA:  Shortness of breath.  EXAM: PORTABLE CHEST - 1 VIEW  COMPARISON:  Chest x-ray 07/28/2013.  FINDINGS: Prior CABG. Cardiomegaly and pulmonary vascular prominence and bilateral interstitial prominence. No pleural effusion or pneumothorax. Pulmonary interstitial prominence has improved from prior study. No acute bony abnormality.  IMPRESSION: Improving congestive heart failure and interstitial edema .   Electronically Signed   By: Maisie Fushomas  Register   On: 01/01/2014 12:29     Labs: Basic Metabolic Panel:  Recent Labs Lab 01/15/14 0405 01/16/14 0422 01/17/14 0451 01/18/14 0440 01/19/14 0840  NA 143 140 139 134* 135*  K 3.8 3.9 3.8 3.7 3.5*  CL 96 94* 92* 90* 90*  CO2 28 28 31 28  32  GLUCOSE 71 65* 69* 92 95  BUN 69* 73* 75* 67* 64*  CREATININE 5.21* 5.30* 5.18* 4.70* 4.59*  CALCIUM 8.1* 8.4 8.5 8.0* 8.1*  PHOS 9.1* 7.8* 7.3* 6.1* 6.4*   Liver Function Tests:  Recent Labs Lab 01/15/14 0405 01/16/14 0422 01/17/14 0451 01/18/14 0440 01/19/14 0840  ALBUMIN 2.3* 2.3* 2.4* 2.2* 2.3*   No results found for this basename: LIPASE, AMYLASE,  in the last 168 hours No results found for this basename: AMMONIA,  in the last 168 hours CBC: CBC Latest Ref Rng 01/14/2014 01/12/2014 01/11/2014  WBC 4.0 - 10.5 K/uL 9.3 10.4 10.6(H)  Hemoglobin 12.0 - 15.0 g/dL 7.3(L) 7.2(L) 9.0(L)  Hematocrit 36.0 - 46.0 % 23.6(L) 23.3(L) 28.8(L)  Platelets 150 - 400 K/uL 364 368 351    Cardiac Enzymes: No results found for this basename: CKTOTAL, CKMB, CKMBINDEX, TROPONINI,  in the last 168 hours  BNP (last 3 results)  Recent Labs  07/28/13 1650  PROBNP 32203.0*   CBG:  Recent Labs Lab 01/18/14 1208 01/18/14 1422 01/18/14 1655 01/18/14 2125 01/19/14 0801  GLUCAP 111* 104* 90 114* 96    Signed  Cordelia PenLaurie Easterwood, NP-C [facility]  pgr [phone]  Attending Patient was seen, examined,treatment plan was discussed with the Physician extender. I have directly reviewed the clinical findings, lab, imaging studies and management of this patient in detail. I have made the necessary changes to the above noted documentation, and agree with the documentation, as recorded by the Physician extender.  Windell NorfolkS Daphene Chisholm MD [facility].

## 2014-01-19 NOTE — Progress Notes (Signed)
[facility].

## 2014-01-19 NOTE — Clinical Social Work Placement (Signed)
Clinical Social Work Department CLINICAL SOCIAL WORK PLACEMENT NOTE 01/19/2014  Patient:  Tricia [name],[name]  Account Number:  [id number] Admit date:  01/01/2014  Clinical Social Worker:  Lavell LusterJOSEPH BRYANT Hussain Maimone, LCSWA  Date/time:  01/14/2014 03:56 PM  Clinical Social Work is seeking post-discharge placement for this patient at the following level of care:   SKILLED NURSING   (*CSW will update this form in Epic as items are completed)   01/14/2014  Patient/family provided with [facility] Department of Clinical Social Work's list of facilities offering this level of care within the geographic area requested by the patient (or if unable, by the patient's family).  01/14/2014  Patient/family informed of their freedom to choose among providers that offer the needed level of care, that participate in Medicare, Medicaid or managed care program needed by the patient, have an available bed and are willing to accept the patient.  01/14/2014  Patient/family informed of [facility]' ownership interest in [facility], as well as of the fact that they are under no obligation to receive care at this facility.  PASARR submitted to EDS on 01/14/2014 PASARR number received on 01/14/2014  FL2 transmitted to all facilities in geographic area requested by pt/family on  01/14/2014 FL2 transmitted to all facilities within larger geographic area on   Patient informed that his/her managed care company has contracts with or will negotiate with  certain facilities, including the following:     Patient/family informed of bed offers received:  01/16/2014 [facility] Physician recommends and patient chooses bed at    [facility] on  01/19/2014 Patient to be transferred to facility by Ambulance Patient and family notified of transfer on 01/19/2014 Name of family member notified:  Smitty CordsBruce  The following physician request were  entered in Epic:   Additional Comments: Per Md patient ready [facility]. RN, patient's son, and facility notified of DC. RN given number for report. Dc packet on chart. Ambulance transport requested for patient at 2:00PM.   Roddie McBryant Taji Barretto MSW, [place]WA, [place], [phone]

## 2014-01-19 NOTE — Progress Notes (Signed)
Ursula BeathMargie E Gaede to be D/C'd SNF per MD order.  Discussed with the patient and all questions fully answered.    Medication List    STOP taking these medications       labetalol 200 MG tablet  Commonly known as:  NORMODYNE     propranolol 80 MG tablet  Commonly known as:  INDERAL      TAKE these medications       amLODipine 10 MG tablet  Commonly known as:  NORVASC  Take 1 tablet (10 mg total) by mouth daily.     aspirin 81 MG EC tablet  Take 1 tablet (81 mg total) by mouth daily.     calcitRIOL 0.25 MCG capsule  Commonly known as:  ROCALTROL  Take 0.25 mcg by mouth 3 (three) times a week. On Monday, Wednesday, and Friday     carvedilol 25 MG tablet  Commonly known as:  COREG  Take 1 tablet (25 mg total) by mouth 2 (two) times daily with a meal.     clonazePAM 2 MG tablet  Commonly known as:  KLONOPIN  Take 1 tablet (2 mg total) by mouth 2 (two) times daily.     darbepoetin 100 MCG/0.5ML Soln injection  Commonly known as:  ARANESP  Inject 0.5 mLs (100 mcg total) into the skin every Tuesday at 6 PM.     doxazosin 2 MG tablet  Commonly known as:  CARDURA  Take 1 tablet (2 mg total) by mouth at bedtime.     DSS 100 MG Caps  Take 100 mg by mouth 2 (two) times daily.     furosemide 40 MG tablet  Commonly known as:  LASIX  Take 1 tablet (40 mg total) by mouth 2 (two) times daily.     hydrALAZINE 50 MG tablet  Commonly known as:  APRESOLINE  Take 4 tablets (200 mg total) by mouth 3 (three) times daily.     HYDROcodone-acetaminophen 7.5-325 MG per tablet  Commonly known as:  NORCO  Take 2 tablets by mouth 4 (four) times daily -  with meals and at bedtime.     oxyCODONE 5 MG immediate release tablet  Commonly known as:  Oxy IR/ROXICODONE  Take 1 tablet (5 mg total) by mouth every 4 (four) hours as needed for moderate pain or severe pain.     polyethylene glycol packet  Commonly known as:  MIRALAX / GLYCOLAX  Take 17 g by mouth daily as needed for moderate  constipation.     sevelamer carbonate 800 MG tablet  Commonly known as:  RENVELA  Take 2 tablets (1,600 mg total) by mouth 3 (three) times daily with meals.     sodium bicarbonate 650 MG tablet  Take 1 tablet (650 mg total) by mouth 2 (two) times daily.     Trospium Chloride 60 MG Cp24  Take 60 mg by mouth daily.     vitamin B-12 50 MCG tablet  Commonly known as:  CYANOCOBALAMIN  Take 50 mcg by mouth daily.        VVS, Skin clean, dry and intact without evidence of skin break down, no evidence of skin tears noted. IV catheter discontinued intact. Site without signs and symptoms of complications. Dressing and pressure applied.  An After Visit Summary was printed and given to the patient.  D/c education completed with patient/family including follow up instructions, medication list, d/c activities limitations if indicated, with other d/c instructions as indicated by MD - patient able to verbalize understanding,  all questions fully answered.   Patient instructed to return to ED, call 911, or call MD for any changes in condition.   Patient escorted via stretcher, and D/C SNF via EMS.  Beckey DowningFlores, Esti Demello F 01/19/2014 2:46 PM

## 2014-01-19 NOTE — Plan of Care (Signed)
Problem: Phase III Progression Outcomes Goal: Wound care performed by pt/family Outcome: Adequate for Discharge Pt going to SNF   Problem: Discharge Progression Outcomes Goal: Patient verbalizes wound care regimen Outcome: Adequate for Discharge Pt going to SNF, wound care will be done there.

## 2014-01-20 ENCOUNTER — Encounter (HOSPITAL_COMMUNITY): Payer: Self-pay | Admitting: Plastic Surgery

## 2014-01-20 LAB — GLUCOSE, CAPILLARY: GLUCOSE-CAPILLARY: 107 mg/dL — AB (ref 70–99)

## 2014-02-09 ENCOUNTER — Telehealth: Payer: Self-pay

## 2014-02-09 ENCOUNTER — Ambulatory Visit: Payer: Medicare Other | Admitting: Cardiology

## 2014-02-09 NOTE — Telephone Encounter (Signed)
Patient died per Obituary °

## 2014-02-13 DEATH — deceased

## 2016-03-13 IMAGING — CT CT FOREARM*L* W/O CM
4 series · 11 of 20 positions shown, 12 images · non-contrast
Comparison: 01/01/14 radiograph

CLINICAL DATA: Extensive swelling, cellulitis, severe pain left
forearm, patient reports symptoms began 24-36 hours ago with weeping
from the affected are, denies trauma, reporting features and chills

EXAM:
CT OF THE LEFT FOREARM WITHOUT CONTRAST
TECHNIQUE: Multidetector CT imaging was performed according to the standard
protocol. Multiplanar CT image reconstructions were also generated.

[Series 4: lfov ext 3.0 b40s · axial · 0.73mm/px · z∈[+1216,+1344]mm · 2 of 130 slices shown, 3 images]
[im 44/130  soft-tissue]
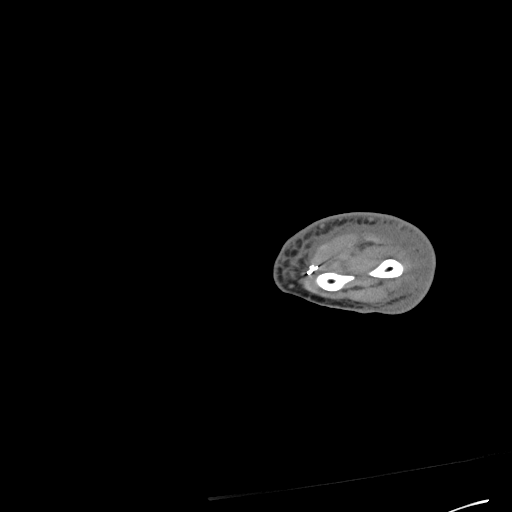
[im 44/130  bone]
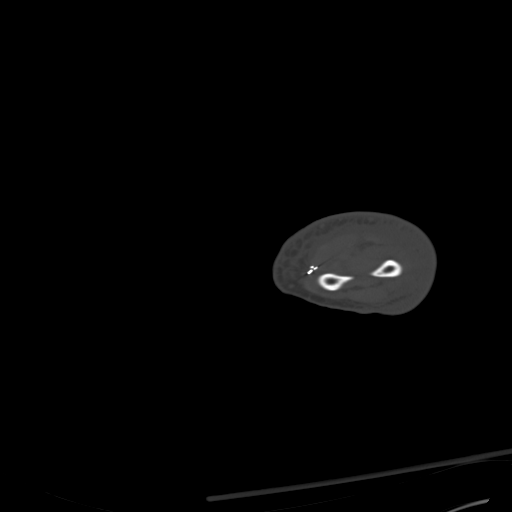
[im 87/130  bone]
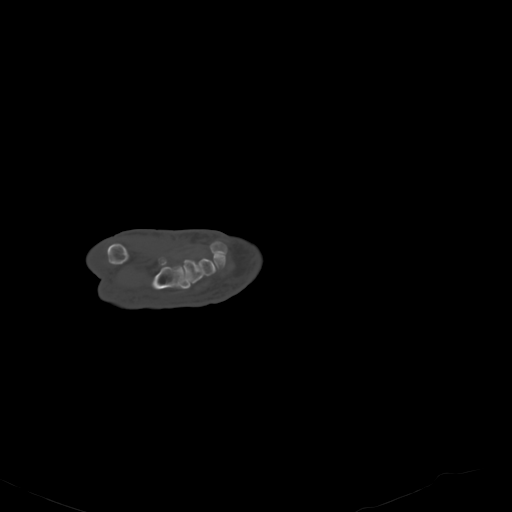

[Series 602: bone axial · axial · 0.76mm/px · z∈[+1061,+1247]mm · 3 of 152 slices shown]
[im 38/152  bone]
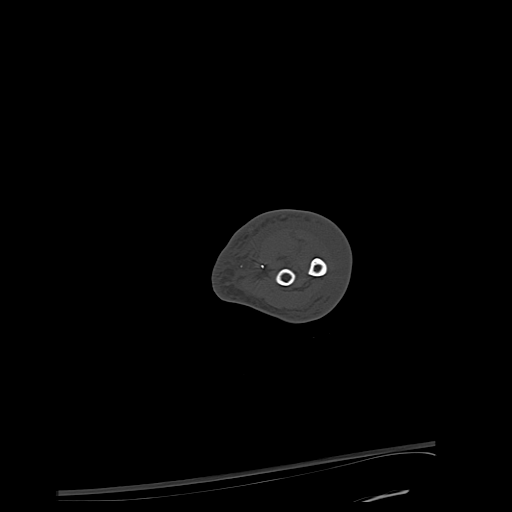
[im 76/152  bone]
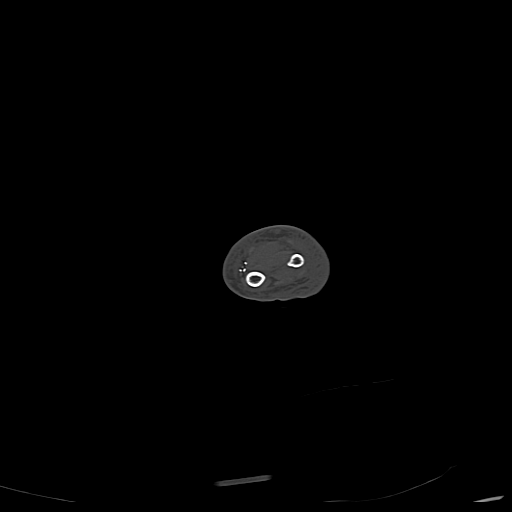
[im 114/152  bone]
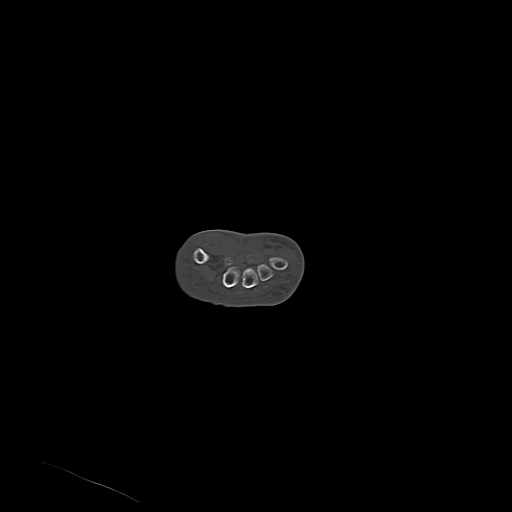

[Series 605: st axial · axial · 0.76mm/px · z∈[+1081,+1262]mm · 3 of 147 slices shown]
[im 37/147  bone]
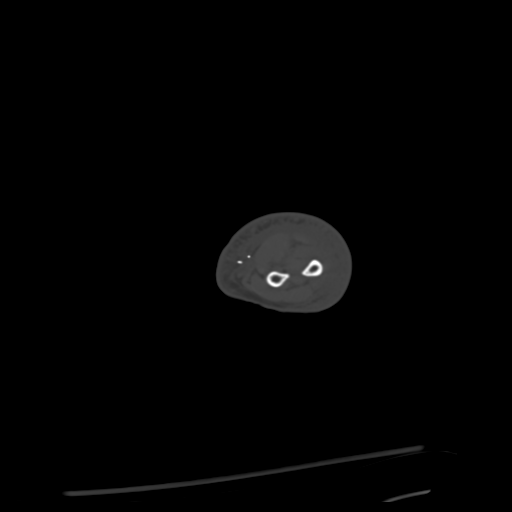
[im 74/147  bone]
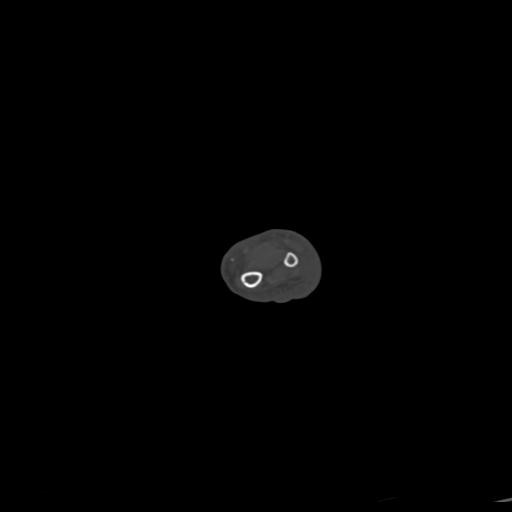
[im 110/147  bone]
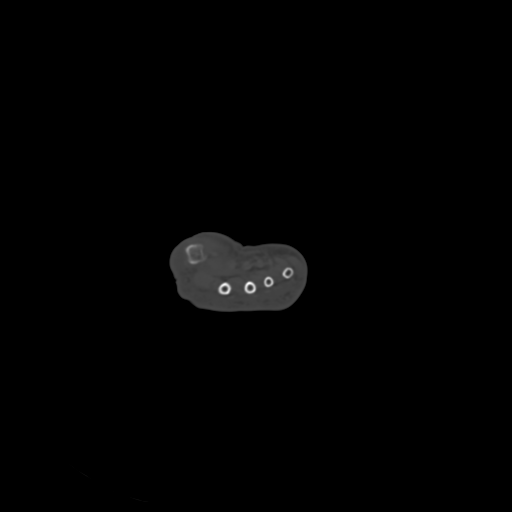

[Series 607: st cor · coronal · 0.76mm/px · 3 of 39 slices shown]
[im 8/39  bone]
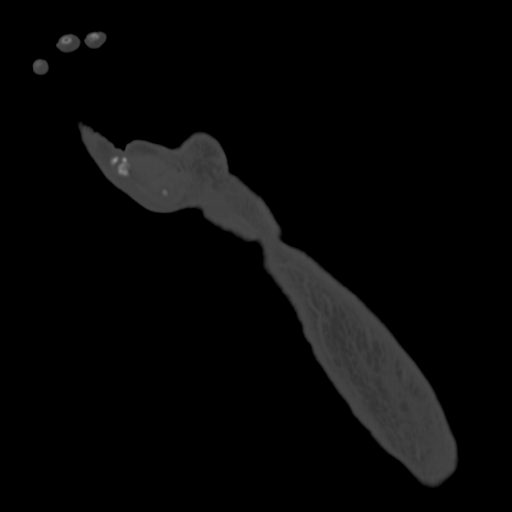
[im 16/39  bone]
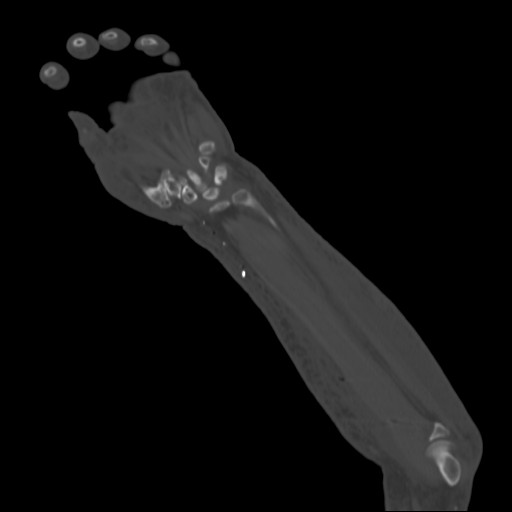
[im 23/39  bone]
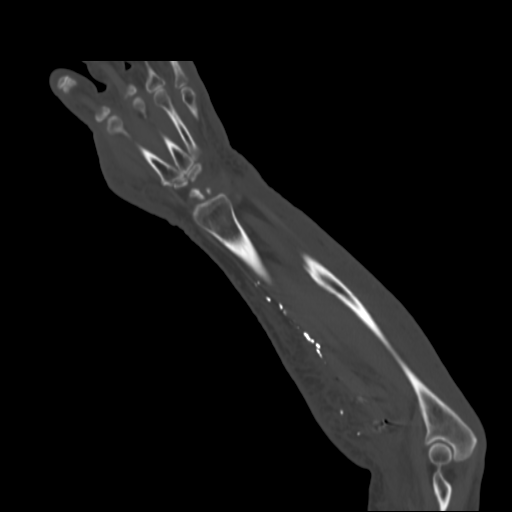

[11 of 20 positions shown; findings below may reference images not displayed]

FINDINGS: There are numerous vascular clips along the volar surface of the
forearm beginning just distal to the elbow joint and extending along
the volar/radial surface all the way to the level of the wrist.
There is diffuse thickening of the dermis circumferentially. There
is reticular edematous change in the subcutaneous soft tissues
circumferentially, of moderate severity diffusely, but particularly
severe along the dorsal surface. Edematous changes extend into the
hand. There is no evidence of focal organized fluid collection to
suggest abscess. There is no focal osseous abnormalities to suggest
osteomyelitis.
IMPRESSION: Severe diffuse edematous change consistent with severe cellulitis.
